# Patient Record
Sex: Female | Born: 1980 | Race: Black or African American | Hispanic: No | Marital: Single | Smoking: Never smoker
Health system: Southern US, Community
[De-identification: ages and names within clinical notes are randomized; demographics above are authoritative.]

## PROBLEM LIST (undated history)

## (undated) DIAGNOSIS — R519 Headache, unspecified: Secondary | ICD-10-CM

## (undated) DIAGNOSIS — G8929 Other chronic pain: Secondary | ICD-10-CM

## (undated) DIAGNOSIS — E785 Hyperlipidemia, unspecified: Secondary | ICD-10-CM

## (undated) DIAGNOSIS — R569 Unspecified convulsions: Secondary | ICD-10-CM

## (undated) DIAGNOSIS — J353 Hypertrophy of tonsils with hypertrophy of adenoids: Secondary | ICD-10-CM

## (undated) DIAGNOSIS — L0291 Cutaneous abscess, unspecified: Secondary | ICD-10-CM

## (undated) DIAGNOSIS — Z87898 Personal history of other specified conditions: Secondary | ICD-10-CM

## (undated) DIAGNOSIS — I10 Essential (primary) hypertension: Secondary | ICD-10-CM

## (undated) DIAGNOSIS — R51 Headache: Secondary | ICD-10-CM

## (undated) HISTORY — PX: TONSILLECTOMY: SUR1361

## (undated) HISTORY — PX: BREAST REDUCTION SURGERY: SHX8

## (undated) HISTORY — DX: Hyperlipidemia, unspecified: E78.5

---

## 2003-09-14 ENCOUNTER — Ambulatory Visit (HOSPITAL_COMMUNITY): Admission: AD | Admit: 2003-09-14 | Discharge: 2003-09-14 | Payer: Self-pay | Admitting: *Deleted

## 2003-09-30 ENCOUNTER — Ambulatory Visit (HOSPITAL_COMMUNITY): Admission: AD | Admit: 2003-09-30 | Discharge: 2003-09-30 | Payer: Self-pay | Admitting: *Deleted

## 2003-09-30 ENCOUNTER — Ambulatory Visit (HOSPITAL_COMMUNITY): Admission: RE | Admit: 2003-09-30 | Discharge: 2003-10-01 | Payer: Self-pay | Admitting: *Deleted

## 2003-10-04 ENCOUNTER — Inpatient Hospital Stay (HOSPITAL_COMMUNITY): Admission: AD | Admit: 2003-10-04 | Discharge: 2003-10-08 | Payer: Self-pay | Admitting: *Deleted

## 2003-11-29 ENCOUNTER — Inpatient Hospital Stay (HOSPITAL_COMMUNITY): Admission: EM | Admit: 2003-11-29 | Discharge: 2003-12-04 | Payer: Self-pay | Admitting: Emergency Medicine

## 2003-12-01 HISTORY — PX: CHOLECYSTECTOMY: SHX55

## 2004-01-07 ENCOUNTER — Ambulatory Visit (HOSPITAL_COMMUNITY): Admission: RE | Admit: 2004-01-07 | Discharge: 2004-01-07 | Payer: Self-pay | Admitting: *Deleted

## 2004-01-08 HISTORY — PX: TUBAL LIGATION: SHX77

## 2004-04-04 ENCOUNTER — Emergency Department (HOSPITAL_COMMUNITY): Admission: EM | Admit: 2004-04-04 | Discharge: 2004-04-04 | Payer: Self-pay | Admitting: Emergency Medicine

## 2004-07-12 ENCOUNTER — Emergency Department (HOSPITAL_COMMUNITY): Admission: EM | Admit: 2004-07-12 | Discharge: 2004-07-12 | Payer: Self-pay | Admitting: Emergency Medicine

## 2005-06-14 ENCOUNTER — Emergency Department (HOSPITAL_COMMUNITY): Admission: EM | Admit: 2005-06-14 | Discharge: 2005-06-14 | Payer: Self-pay | Admitting: Emergency Medicine

## 2006-09-17 ENCOUNTER — Emergency Department (HOSPITAL_COMMUNITY): Admission: EM | Admit: 2006-09-17 | Discharge: 2006-09-18 | Payer: Self-pay | Admitting: Emergency Medicine

## 2006-09-17 ENCOUNTER — Emergency Department (HOSPITAL_COMMUNITY): Admission: EM | Admit: 2006-09-17 | Discharge: 2006-09-17 | Payer: Self-pay | Admitting: Emergency Medicine

## 2006-09-23 ENCOUNTER — Emergency Department (HOSPITAL_COMMUNITY): Admission: EM | Admit: 2006-09-23 | Discharge: 2006-09-23 | Payer: Self-pay | Admitting: Emergency Medicine

## 2007-01-15 ENCOUNTER — Emergency Department (HOSPITAL_COMMUNITY): Admission: EM | Admit: 2007-01-15 | Discharge: 2007-01-15 | Payer: Self-pay | Admitting: Emergency Medicine

## 2007-01-23 ENCOUNTER — Emergency Department (HOSPITAL_COMMUNITY): Admission: EM | Admit: 2007-01-23 | Discharge: 2007-01-23 | Payer: Self-pay | Admitting: Emergency Medicine

## 2007-02-15 ENCOUNTER — Emergency Department (HOSPITAL_COMMUNITY): Admission: EM | Admit: 2007-02-15 | Discharge: 2007-02-15 | Payer: Self-pay | Admitting: Emergency Medicine

## 2007-04-29 ENCOUNTER — Emergency Department (HOSPITAL_COMMUNITY): Admission: EM | Admit: 2007-04-29 | Discharge: 2007-04-29 | Payer: Self-pay | Admitting: Emergency Medicine

## 2007-06-12 ENCOUNTER — Emergency Department (HOSPITAL_COMMUNITY): Admission: EM | Admit: 2007-06-12 | Discharge: 2007-06-12 | Payer: Self-pay | Admitting: Emergency Medicine

## 2007-09-30 ENCOUNTER — Emergency Department (HOSPITAL_COMMUNITY): Admission: EM | Admit: 2007-09-30 | Discharge: 2007-09-30 | Payer: Self-pay | Admitting: Emergency Medicine

## 2007-11-04 ENCOUNTER — Emergency Department (HOSPITAL_COMMUNITY): Admission: EM | Admit: 2007-11-04 | Discharge: 2007-11-04 | Payer: Self-pay | Admitting: Emergency Medicine

## 2008-04-24 DIAGNOSIS — Z87898 Personal history of other specified conditions: Secondary | ICD-10-CM

## 2008-04-24 HISTORY — DX: Personal history of other specified conditions: Z87.898

## 2008-05-13 ENCOUNTER — Emergency Department (HOSPITAL_COMMUNITY): Admission: EM | Admit: 2008-05-13 | Discharge: 2008-05-13 | Payer: Self-pay | Admitting: Emergency Medicine

## 2008-11-11 ENCOUNTER — Emergency Department (HOSPITAL_COMMUNITY): Admission: EM | Admit: 2008-11-11 | Discharge: 2008-11-11 | Payer: Self-pay | Admitting: Emergency Medicine

## 2008-12-24 ENCOUNTER — Ambulatory Visit (HOSPITAL_COMMUNITY): Admission: RE | Admit: 2008-12-24 | Discharge: 2008-12-24 | Payer: Self-pay | Admitting: Family Medicine

## 2009-04-05 ENCOUNTER — Emergency Department (HOSPITAL_COMMUNITY): Admission: EM | Admit: 2009-04-05 | Discharge: 2009-04-05 | Payer: Self-pay | Admitting: Emergency Medicine

## 2009-05-06 ENCOUNTER — Ambulatory Visit: Admission: RE | Admit: 2009-05-06 | Discharge: 2009-05-06 | Payer: Self-pay | Admitting: Neurology

## 2010-05-16 ENCOUNTER — Encounter: Payer: Self-pay | Admitting: Family Medicine

## 2010-06-20 ENCOUNTER — Emergency Department (HOSPITAL_COMMUNITY)
Admission: EM | Admit: 2010-06-20 | Discharge: 2010-06-20 | Disposition: A | Discharge status: Home / Self Care | Payer: Medicaid Other | Attending: Emergency Medicine | Admitting: Emergency Medicine

## 2010-06-20 ENCOUNTER — Emergency Department (HOSPITAL_COMMUNITY): Payer: Medicaid Other

## 2010-06-20 DIAGNOSIS — R0789 Other chest pain: Secondary | ICD-10-CM | POA: Insufficient documentation

## 2010-06-20 DIAGNOSIS — F411 Generalized anxiety disorder: Secondary | ICD-10-CM | POA: Insufficient documentation

## 2010-06-20 DIAGNOSIS — R0989 Other specified symptoms and signs involving the circulatory and respiratory systems: Secondary | ICD-10-CM | POA: Insufficient documentation

## 2010-06-20 DIAGNOSIS — I10 Essential (primary) hypertension: Secondary | ICD-10-CM | POA: Insufficient documentation

## 2010-06-20 DIAGNOSIS — R0609 Other forms of dyspnea: Secondary | ICD-10-CM | POA: Insufficient documentation

## 2010-06-20 LAB — BASIC METABOLIC PANEL
BUN: 7 mg/dL (ref 6–23)
CO2: 28 mEq/L (ref 19–32)
Calcium: 8.7 mg/dL (ref 8.4–10.5)
Chloride: 103 mEq/L (ref 96–112)
Creatinine, Ser: 0.77 mg/dL (ref 0.4–1.2)
Glucose, Bld: 87 mg/dL (ref 70–99)
Sodium: 139 mEq/L (ref 135–145)

## 2010-06-20 LAB — URINALYSIS, ROUTINE W REFLEX MICROSCOPIC
Bilirubin Urine: NEGATIVE
Ketones, ur: NEGATIVE mg/dL
Nitrite: NEGATIVE
Urine Glucose, Fasting: NEGATIVE mg/dL
Urobilinogen, UA: 1 mg/dL (ref 0.0–1.0)
pH: 6 (ref 5.0–8.0)

## 2010-06-20 LAB — DIFFERENTIAL
Eosinophils Relative: 2 % (ref 0–5)
Lymphocytes Relative: 30 % (ref 12–46)
Lymphs Abs: 2.9 10*3/uL (ref 0.7–4.0)
Monocytes Absolute: 0.6 10*3/uL (ref 0.1–1.0)
Neutrophils Relative %: 62 % (ref 43–77)

## 2010-06-20 LAB — POCT CARDIAC MARKERS: CKMB, poc: <1 ng/mL — ABNORMAL LOW (ref 1.0–8.0)

## 2010-06-20 LAB — CBC
HCT: 37 % (ref 36.0–46.0)
MCH: 29.1 pg (ref 26.0–34.0)
Platelets: 429 10*3/uL — ABNORMAL HIGH (ref 150–400)

## 2010-06-21 ENCOUNTER — Emergency Department (HOSPITAL_COMMUNITY)
Admission: EM | Admit: 2010-06-21 | Discharge: 2010-06-21 | Disposition: A | Discharge status: Home / Self Care | Payer: Medicaid Other | Attending: Emergency Medicine | Admitting: Emergency Medicine

## 2010-06-21 DIAGNOSIS — R079 Chest pain, unspecified: Secondary | ICD-10-CM | POA: Insufficient documentation

## 2010-06-21 DIAGNOSIS — R071 Chest pain on breathing: Secondary | ICD-10-CM | POA: Insufficient documentation

## 2010-06-21 DIAGNOSIS — R0602 Shortness of breath: Secondary | ICD-10-CM | POA: Insufficient documentation

## 2010-07-26 LAB — URINE MICROSCOPIC-ADD ON

## 2010-07-26 LAB — URINALYSIS, ROUTINE W REFLEX MICROSCOPIC
Bilirubin Urine: NEGATIVE
Hgb urine dipstick: NEGATIVE
Nitrite: NEGATIVE
Specific Gravity, Urine: 1.015 (ref 1.005–1.030)
pH: >9 — ABNORMAL HIGH (ref 5.0–8.0)

## 2010-09-09 NOTE — Discharge Summary (Signed)
NAME:  Denise Walters, Denise Walters                         ACCOUNT NO.:  n   MEDICAL RECORD NO.:  [id number]                   PATIENT TYPE:  INP   LOCATION:  A426                                 FACILITY:  [facility]   PHYSICIAN:  Langley Gauss, M.D.                DATE OF BIRTH:  1980/11/24   DATE OF ADMISSION:  10/04/2003  DATE OF DISCHARGE:  10/08/2003                                 DISCHARGE SUMMARY   PROCEDURES PERFORMED:  1. On October 04, 2003, placement of Foley bulb for mechanical ripening of     cervix prior to amniotomy and induction.  2. On October 05, 2003, patient experienced arrestive dilatation in the active     phase of labor at 6 cm.  She underwent primary low transverse cesarean     section utilizing spinal analgesic, delivered a viable 7 pound female     infant.  She was discharged to home on October 08, 2003, given a copy of     standard discharge instructions.   FOLLOW UP:  Will follow up in the office in 2 days time for removal of the  staples from the Pfannenstiel incision.   SPECIAL INSTRUCTIONS:  She was given a copy of standardized discharge  instructions.   DISCHARGE MEDICATIONS:  1. Tylox #30 for pain relief.  2. In addition she has a tube of Mycolog II cream for use under her     panniculus for cutaneous yeast.   LABORATORY DATA:  Serial hemoglobins and hematocrits 11.6/34.0.  On  postoperative day number one they were 10.9/31.9 with a white count of 14.6;  postoperative day number three the hemoglobin was 10.7, hematocrit was 31.6  and a white count was 16.6.  A positive blood type.   HOSPITAL COURSE:  The patient was admitted on October 04, 2003 for indicated  induction of labor.  She failed to progress in active labor, thus she  underwent a primary low transverse cesarean section under spinal analgesic  on October 05, 2003.  Postoperatively, the patient did well.  She had a Al Pimple in the subcutaneous space and a Foley catheter with adequate urine  output.  Thus,  the Foley catheter was removed on postoperative day number  one.  The Jackson-Pratt had a tendency to clot up and this was aspirated on  two occasions.  The patient was minimally ambulatory on postoperative day  number one; however, she remained afebrile.  On postoperative day number two  the patient slightly increased her ambulation.  She was up to use the  bathroom frequently and began having gas pains.  On postoperative day number  three the patient now continues to remain afebrile and has resumed all  normal bowel function. The Jackson-Pratt drain has been removed.  The  abdomen is soft, non-tender and non-distended.  The incision area is well  approximated with minimal erythema and minimal duration which is responding  very well to the Mycolog ointment for the cutaneous yeast candidiasis.  The  patient is discharged home on postoperative day number three with the  patient in the accompaniment of her mother.     ___________________________________________                                         Langley Gauss, M.D.   DC/MEDQ  D:  10/08/2003  T:  10/08/2003  Job:  [id number]

## 2010-09-09 NOTE — H&P (Signed)
NAMELANAIYA, LANTRY                         ACCOUNT NO.:  [id number]   MEDICAL RECORD NO.:  [id number]                  PATIENT TYPE:  INP   LOCATION:                                       FACILITY:  [facility]   PHYSICIAN:  Langley Gauss, M.D.                DATE OF BIRTH:  Jul 30, 1980   DATE OF ADMISSION:  10/12/2003  DATE OF DISCHARGE:                                HISTORY & PHYSICAL   This is a 30 year old gravida 1, para 1 with postoperative low transverse  cesarean section performed on October 05, 2003.  Patient was previously seen in  the office on October 09, 2003, at which time staples were removed from the  midline abdominal incision.  At that time, minimal erythema and induration  noted, and the skin edges were noted to be well approximated.  Patient comes  in today on October 12, 2003 complaining of drainage at the incision site.  She  has not been seen in the ER, as I had advised her that she may very well  have some leakage from the incision, especially if there was separation of  the skin edges.  Patient's postoperative course was without complications.   CURRENT MEDICATIONS:  She has stopped taking her pain medication on a  regular basis.  The last time took them was the day previously.   She has no known drug allergies.   PAST MEDICAL HISTORY:  Pertinent only for a prior C-section.   PHYSICAL EXAMINATION:  VITAL SIGNS:  She is noted to be afebrile.  Blood  pressure is 143/96.  ABDOMEN:  Soft.  Noted in its inferiormost portion, though, is a very foul  odor with mucopurulent, thin discharge.  Pressure applied up and down the  entire incision.  I was able to evacuate about 25 cc of this liquid until it  became serous in nature.   Patient poorly tolerated this secondary to her extreme emotional immaturity  as well as a large panniculus, which required immobilization and elevation  during the procedure.  I offered the patient hospitalization at this time  for initiation of IV  antibiotics and IV pain medications for attention for  wound cleansing; however, the patient denied hospitalization, but I felt  also that it was reasonable to continue with outpatient management.  Thus,  at this point in time, she is treated with ciprofloxacin 500 mg p.o. b.i.d.  x10 days.  In addition, she does have some pain medication which she can  take prior to re-evaluation.   Patient was seen in the a.m. of October 12, 2003 and advised to present at 1600  on October 12, 2003.  At this point in time, she has taken some Tylox.  She has  started the antibiotics and done well.  She denies any fever.   On examination, there is noted to be a minimal foul odor now identified with  manipulation of  the wound, I was able to obtain only serous drainage at the  inferior-most portion of the incision.  Additionally, the mid portion of the  incision was noted to be slightly open, but this area probed with a Q-tip  does not reveal any significantly loculations of fluid nor is any additional  purulence identified.  Currently, there is noted to be indurations  surrounding the wound, but no significant erythema and appropriate  tenderness only.  The wound is thus irrigated utilizing a Betadine solution,  which is now well tolerated by the patient.   ASSESSMENT/PLAN:  Patient with a postoperative wound infection with seroma  formation:  We will continue with q.24h. evaluations, consisting cleansing  as well as irrigation of the wound.  Patient will continue with her p.o.  antibiotics.  She is aware that should deterioration in that clinical  appearance of the wound occur, she may possibly require rehospitalization  and reexploration; however, at this point in time of the initial treatment,  she appears to have responded well and with continued drainage and  antibiotics, this very well may be possible to be managed as an outpatient.     ___________________________________________                                          Langley Gauss, M.D.   DC/MEDQ  D:  10/12/2003  T:  10/12/2003  Job:  [id number]

## 2010-09-09 NOTE — Op Note (Signed)
NAMEROSHAWN, Denise Walters                         ACCOUNT NO.:  [id number]   MEDICAL RECORD NO.:  [id number]                   PATIENT TYPE:  INP   LOCATION:  LDR1                                 FACILITY:  [facility]   PHYSICIAN:  Langley Gauss, M.D.                DATE OF BIRTH:  19-Mar-1981   DATE OF PROCEDURE:  DATE OF DISCHARGE:                                 OPERATIVE REPORT   DIAGNOSES:  1. A 38-39 week intrauterine pregnancy.  2. History of chronic benign essential hypertension preceding pregnancy     admitted for induction of labor.   PROCEDURE PERFORMED:  Placement of Foley bulb for mechanical ripening of  cervix prior to amniotomy.  Procedure performed by Dr. Roylene Reason. Lisette Grinder.   COMPLICATIONS:  None.   SPECIMENS:  None.   ADDITIONAL PROCEDURE:  Nonstress test interpretation.   The patient referred to [facility] in the late p.m. for indication  of induction of labor.  Due to the unfavorable nature of the cervix, it was  necessary to place a Foley bulb for mechanical ripening prior to the  amniotomy.  Appropriate informed consent is obtained.  The patient is placed  in the dorsal lithotomy position.  Sterile speculum examination is  performed.  The cervix is noted to be without lesions.  No vaginal bleeding  or leakage of fluid.  No abnormal discharge is noted.  Cervix appears to be  2 cm long and finger-tip dilated.  A soft 16 French Foley bulb catheter is  then passed through the endocervical os and then inflated to a volume of 60  mL of sterile normal saline.  This went without complications.  Speculum was  then removed.  Gentle traction is then applied, and the Foley bulb was taped  to the patient's left leg.  She tolerated the procedure very well.  She was  taken out of the dorsal lithotomy position.  Nonstress test is again  performed and in progress at this time to reassess patient's fetus.   Of note, the previous week when patient was seen for prenatal visit,  she had  two small 0.5 cm diameter subcutaneous abscesses in the left inner thigh  area.  These were examined at this time.  They appeared to be granulating  well.  They were previously noted to have spontaneously drained purulent  fluid.  No other lesions are identified at this time.  Specifically, no  evidence of any active herpetic lesions.      ___________________________________________                                            Langley Gauss, M.D.   DC/MEDQ  D:  10/04/2003  T:  10/04/2003  Job:  [id number]

## 2010-09-09 NOTE — Discharge Summary (Signed)
Denise Walters, Denise Walters                         ACCOUNT NO.:  [id number]   MEDICAL RECORD NO.:  [id number]                   PATIENT TYPE:  OIB   LOCATION:  A415                                 FACILITY:  [facility]   PHYSICIAN:  Langley Gauss, M.D.                DATE OF BIRTH:  1980-08-27   DATE OF ADMISSION:  09/30/2003  DATE OF DISCHARGE:  10/01/2003                                 DISCHARGE SUMMARY   FINAL DIAGNOSES:  1. 38-5/7 intrauterine pregnancy.  2. Morbid obesity.  3. History of benign essential hypertension preceding the pregnancy.  4. Abdominal pain/false labor.   HISTORY OF PRESENT ILLNESS:  This is a 30 year old gravida 1, para 0 at 14-  5/[redacted] weeks gestation who presents the p.m. of 09/30/2003 complaining of  abdominal cramping since 2200.  The patient's prenatal record is completely  reviewed as well as past medical history and past social history.  The  patient is noted to be high risk secondary to a history of benign essential  hypertension preceding the pregnancy.  She has not been on any anti-  hypertensive medication during this pregnancy.  Blood pressures have  essentially been normal at modified bedrest and low salt diet.  The patient  is a late transfer to my office from previous care provided by the [facility] residents at [facility].   PHYSICAL EXAMINATION:  GENERAL:  She is noted to be a very immature  adolescent morbidly obese female.  VITAL SIGNS:  Blood pressure 147/76, respiratory rate 22, pulse 109,  temperature 97.0.  HEENT:  No significant edema.  NECK:  Supple.  Thyroid is nonpalpable.  LUNGS:  Clear.  CARDIOVASCULAR:  Regular rate and rhythm.  ABDOMEN:  Soft and nontender.  Gravid uterus identified with a fundal height  of 42 cm.  Very large panniculus identified.  She is vertex presentation by  Leopold's maneuver.  EXTREMITIES:  Within normal limits with only trace edema.  PELVIC:  Normal external genitalia.  No lesions or ulcerations  identified.  No vaginal bleeding, no leakage of fluid.  Cervix is noted to be closed.  External fetal monitor non-stress test interpretation on 10/01/2003 reveals  fetal heart rate baseline 150 with accelerations noted.  Normal long-term  variability.  No fetal heart rate decelerations are noted.  No uterine  activity identified on external fetal monitor.  Non-stress test  interpretation on 10/01/2003 reveals reactive non-stress test.   LABORATORY DATA:  Laboratory studies subsequently reviewed reveal A positive  blood type.  Urinalysis pertinent for trace proteinuria, trace leukocyte  esterase, small bilirubin, 21-50 WBCs, many bacteria.  Hemoglobin 11.6,  hematocrit 33.6, white count 9.6.  Liver function tests within normal  limits.  Total protein 6.2.   HOSPITAL COURSE:  The patient showed no signs or symptoms of toxemia.  She  did have mildly elevated blood pressures which have been persistent  throughout the pregnancy.  She  will be treated for a urinary tract infection  with Macrodantin 100 mg at the hospital followed by 100 mg p.o. b.i.d. x7  days as an outpatient.  I discussed at length  with the patient and her mother her current medical status.  Preparations  are made for medically indicated induction of labor due to the unfavorable  nature of the cervix.  Will probably be forced to proceed initially with  Foley bulb ripening.  Services of OB observation extending from 09/30/2003  through 10/01/2003.     ___________________________________________                                         Langley Gauss, M.D.   DC/MEDQ  D:  10/19/2003  T:  10/19/2003  Job:  [id number]

## 2010-09-09 NOTE — Op Note (Signed)
Denise Walters, Denise Walters                         ACCOUNT NO.:  [id number]   MEDICAL RECORD NO.:  [id number]                   PATIENT TYPE:  INP   LOCATION:  A323                                 FACILITY:  [facility]   PHYSICIAN:  Jerolyn Shin C. Katrinka Blazing, M.D.                DATE OF BIRTH:  1980/05/14   DATE OF PROCEDURE:  12/01/2003  DATE OF DISCHARGE:                                 OPERATIVE REPORT   PREOPERATIVE DIAGNOSIS:  Acute cholecystitis, cholelithiasis.   POSTOPERATIVE DIAGNOSIS:  Acute cholecystitis, cholelithiasis.   PROCEDURE:  Laparoscopic cholecystectomy.   SURGEON:  Dr. Katrinka Blazing.   DESCRIPTION:  Under general anesthesia, the patient's abdomen was prepped  and draped in a sterile field.  A supraumbilical incision was made and  Veress needle was inserted into the peritoneal cavity with some difficulty.  After being assured that the needle was in the peritoneal cavity by very low  intra-abdominal pressure and free flow of saline, the abdomen was  insufflated with 3.5 L of CO2.  Using a Visiport guide, a 10 mm port was  placed uneventfully.  The laparoscope was placed.  The patient was placed in  reversed Trendelenburg position.  There was a lot of bile-tinged ascetic  fluid surrounding the liver and the area of the gallbladder.  There were  acute and chronic adhesions to the inferior surface of the liver surrounding  the gallbladder.  Under video scopic guidance, a 10 mm port and two 5 mm  ports were placed without difficulty.  The adhesions could not be taken down  bluntly so they were taken down using electrocautery with a hook spatula.  Once the adhesions were taken down, the gallbladder was visualized.  There  were extensive adhesions to the gallbladder.  There was also edema in the  gallbladder wall as well as in the adhesions.  Using the same hook spatula,  these adhesions were taken down until the gallbladder was fully visualized.  The gallbladder was then positioned by the  assistant.  The cystic duct was  dissected for its entire length.  It was clipped close to the gallbladder,  infundibulum and four distal clips were placed.  The cystic duct was  transected.  There were three small cystic artery branches.  These were  clipped with two clips and divided.  Using electrocautery, the gallbladder  was then separated from the intrahepatic space without much difficulty.  The  gallbladder was long and thickened.  It was placed in the endo-catch device  and retrieved.  Irrigation was carried out.  There was no bleeding from the  bed.  There was no evidence of bile leak otherwise.  A JP drain was placed  and brought out through the most lateral incision in the right upper  quadrant.  Re-inspection of the bed was carried out.  CO2 was allowed to  escape from the abdomen and the ports were removed.  The  incisions were  closed using 0 Dexon on the fascia of the incision at the umbilicus and  staples on the skin of the other incisions.  The patient tolerated the  procedure well.  The JP drain was secured with 3-0 nylon.  Dressings were  placed.  She was awakened from anesthesia uneventfully, transferred to a bed  and taken to a post anesthetic care unit for further monitoring.      ___________________________________________                                            Dirk Dress. Katrinka Blazing, M.D.   LCS/MEDQ  D:  12/01/2003  T:  12/01/2003  Job:  [id number]

## 2010-09-09 NOTE — Op Note (Signed)
Denise Walters, Denise Walters                           ACCOUNT NO.:  [id number]   MEDICAL RECORD NO.:  [id number]                   PATIENT TYPE:   LOCATION:                                       FACILITY:   PHYSICIAN:  Langley Gauss, M.D.                DATE OF BIRTH:   DATE OF PROCEDURE:  01/08/2004  DATE OF DISCHARGE:                                 OPERATIVE REPORT   PREOPERATIVE DIAGNOSIS:  Desires permanent sterilization.   POSTOPERATIVE DIAGNOSIS:  Desires permanent sterilization.   OPERATION/PROCEDURE:  Laparoscopic tubal ligation utilizing bipolar cautery.   SURGEON:  Langley Gauss, M.D.   ANESTHESIA:  General endotracheal.   SPECIMENS:  None.   COMPLICATIONS:  None.   FINDINGS:  Some adhesive disease, very fine and filmy in nature between  ovaries and posterior aspect of the body of the uterus.  In addition, morbid  obesity with very thick abdominal wall with large amount of subcutaneous  fat, making the procedure technically difficult.   DESCRIPTION OF PROCEDURE:  The patient is taken to the operating room.  Vital signs are stable.  She underwent uncomplicated induction of general  anesthesia, placed in the dorsal lithotomy position, prepped and draped in  the usual sterile manner.  Red rubber catheter used to drain 50 mL of clear  yellow urine from the bladder.  Speculum was placed in the vagina.  Anterior  lip of the cervix was grasped with a single-tooth tenaculum.  Hulka uterine  manipulator was then placed into the lower uterine segment and used to clamp  the cervix at 12 o'clock.  Speculum is removed.  Changed to sterile gloves.  Standing at the patient's left side.   A 1 cm vertical incision is made just inferior to the umbilicus.  A 1 cm  transverse incision is made suprapubically.  The abdominal wall is then  elevated.  The Veress needle was then passed through the subumbilical  incision to atraumatically enter the peritoneal cavity, drop test confirmed  the  proper intraperitoneal placement.  Pneumoperitoneum is then created by  insufflating 3 L of CO2 gas at a filling pressure of less than 15 mmHg.  Veress needle was then removed.  The abdominal walls were then again  elevated.  The Visiport trocar and sleeve were inserted through the  subumbilical incision __________  towards the hollow sacrum.  This is done  atraumatically under direct visualization which confirms a proper  intraperitoneal placement.  Under direct visualization a 5 mm trocar and  sleeve were then inserted suprapubically in the midline under direct  visualization.  This was done atraumatically. The patient is now placed on  the Trendelenburg.  However, her procedure is still technically difficult  due to high intra-abdominal pressures due to the patient's morbid obesity.  Thus attempt was made to place the 5 mm trocar and sleeve in the midline  about midway between the umbilicus and suprapubic  site.  This is done  atraumatically.  However, again attempts to identify and visualize the  fallopian tubes are unsuccessful.  Thus the initial suprapubic site is  utilized.  With the uterus now elevated, I was able to identify each of the  fallopian tubes.  Confirmation of  fallopian tubes is made by tracing each  out to their fimbriated end.  The triple cauterization technique is then  utilized on each fallopian tube continuing the cauterization until extensive  tubular destruction was noted bilaterally.  Appropriate photographs are  taken for permanent records.  The remainder of the pelvis as described  previously.  The instruments are removed and  gas is allowed to escape.  The  incision sites are then closed utilizing skin glue.  Tenaculum is removed.  The patient is then reversed of anesthesia, taken to the recovery room in  stable condition.   Operative findings discussed with the patient's awaiting mother.      ___________________________________________                                             Langley Gauss, M.D.   DC/MEDQ  D:  01/08/2004  T:  01/08/2004  Job:  [id number]

## 2010-09-09 NOTE — H&P (Signed)
NAMESOLEI, WUBBEN                         ACCOUNT NO.:  [id number]   MEDICAL RECORD NO.:  [id number]                   PATIENT TYPE:  INP   LOCATION:  A323                                 FACILITY:  [facility]   PHYSICIAN:  Jerolyn Shin C. Katrinka Blazing, M.D.                DATE OF BIRTH:  Sep 27, 1980   DATE OF ADMISSION:  11/29/2003  DATE OF DISCHARGE:                                HISTORY & PHYSICAL   HISTORY:  A 29 year old female who gives a history of acute onset of severe  abdominal pain about three days ago which was about November 26, 2003.  The  pain was extremely severe.  It was in the epigastrium and radiated into her  back.  She had emesis which started two days prior to admission and  continued until she was seen in the emergency room on November 29, 2003.  In  the emergency room, she was in moderately severe distress.  She had  recurrent episodes of retching with nausea and vomiting.  She also  complained of chest pain.  Abdominal examination was compatible with an  acute abdomen.  White count was 14,300, potassium 2.8.  There was mild  elevation of SGOT at 44, and SGPT at 45, with normal alkaline phosphatase  and bilirubin of 2.2.  Lipase was 3060, and amylase was over 2000.  The  patient was felt to have biliary pancreatitis.  She is admitted to have  treatment of her symptoms and she will have urgent ultrasound in the  morning.   PAST MEDICAL HISTORY:  1. She had a normal delivery in June 2005.  2. Hypertension.   MEDICATIONS:  Hydrochlorothiazide 25 mg daily.   PAST SURGICAL HISTORY:  1. Breast reduction at age 25.  2. Cesarean section for failure to progress in June 2005.   FAMILY HISTORY:  Negative.   ALLERGIES:  No known drug allergies, though she does state that she has an  allergy to CHOCOLATE.   SOCIAL HISTORY:  She is single, she has one child.  She is unemployed.  She  does not drink, smoke, or use drugs.   PHYSICAL EXAMINATION:  VITAL SIGNS:  Blood pressure 126/74,  pulse 100,  respirations 20, O2 saturation 97%, temperature 98 degrees.  HEENT:  Unremarkable.  There is no jaundice.  NECK:  Supple, no JVD or bruit.  CHEST:  Clear to auscultation, no rales, rubs, rhonchi, or wheezes.  HEART:  Regular rate and rhythm without murmurs, rubs, or gallops.  ABDOMEN:  Obese, moderately tender with diffuse tenderness in both upper  quadrants and epigastrium.  There is no lower abdominal tenderness.  There  is no mass.  She has good active bowel sounds.  EXTREMITIES:  No cyanosis, clubbing, or edema.  NEUROLOGIC:  No focal motor, sensory, or cerebellar deficits.  Cranial  nerves are intact.   IMPRESSION:  1. Suspected biliary pancreatitis.  2. Probable cholelithiasis with cholecystitis.  3. History of hypertension.   PLAN:  The patient will receive Dilaudid for pain.  She will be continued on  Phenergan and Zofran p.r.n. for nausea.  She is started on Ancef because of  her leukocytosis.  I will get an urgent ultrasound in the morning, and we  will proceed with operative therapy and/or endoscopic retrograde  cholangiopancreatography based on the results of her liver function tests  and her amylase and lipase.     ___________________________________________                                         Dirk Dress. Katrinka Blazing, M.D.   LCS/MEDQ  D:  11/30/2003  T:  11/30/2003  Job:  [id number]

## 2010-09-09 NOTE — H&P (Signed)
Denise, Walters                         ACCOUNT NO.:  [id number]   MEDICAL RECORD NO.:  [id number]                   PATIENT TYPE:  OBV   LOCATION:  A415                                 FACILITY:  [facility]   PHYSICIAN:  Langley Gauss, M.D.                DATE OF BIRTH:  1980-06-20   DATE OF ADMISSION:  09/14/2003  DATE OF DISCHARGE:  09/14/2003                                HISTORY & PHYSICAL   This is a 30 year old gravida 1, para 0 seen in an initial OB visit in our  office on Sep 16, 2003.  Patient is guesstimated to be at 36-37 weeks  estimated gestational age.  Patient had previously been seen at [facility], at which time she was referred to the residency program  at [facility], where she has had prenatal care throughout the pregnancy.  Patient states that she has been told that she is a high-risk pregnancy, as  she is high risk for hypertension.  Patient had previously taken  hydrochlorothiazide, HCTZ, 25 mg p.o. q.d. prior to the pregnancy, with  about one year's duration of total use.  This was discontinued during the  first trimester, and subsequently she has been followed closely with careful  monitoring of blood pressures to see whether hypertension does develop.   Patient has had two ultrasounds done during the pregnancy, most recently one  week ago, which by her report at [facility] revealed adequate fetal growth.   Patient has had no hypertensive problems during the pregnancy.  She has not  had any non-stress testing performed.   Patient was having some abdominal and pelvic cramping on Sep 14, 2003.  She  presented to [facility] as an OB unassigned.  At that time,  arrangements were made such that the patient would transfer her prenatal  care here to my office, as she did plan on delivering at [facility]  throughout the whole pregnancy.   PAST MEDICAL HISTORY:  1. She has a history of breast reduction surgery on September 24, 2002.   She is     noted to be morbidly obese.  2. Hypertensive history, as described previously.  3. She denies a history of diabetes.  She states that a glucose tolerance     test during this pregnancy was noted to be normal.   She has no known drug allergies.   SOCIAL HISTORY:  Patient is a nonsmoker.  She is not employed.  The father  of the baby is not involved.  She does have some transportation  difficulties.  Her mother, Denise Walters, was unable to provide transportation  today.   She denies any history of STDs.  Pertinently denies any history of genital  or oral herpes.   She had originally used Depo-Provera for birth control purposes.  Her last  menstrual period is unreliable.   PHYSICAL EXAMINATION:  VITAL SIGNS:  Height 5 feet 2.  Pre-pregnancy weighed  250.  Today's weight is 253.  Blood pressure 136/82, pulse rate 80,  respiratory rate 20.  GENERAL:  A morbidly obese black female.  HEENT:  Negative.  No adenopathy.  NECK:  Supple.  Thyroid is nonpalpable.  LUNGS:  Clear.  HEART:  Regular rate and rhythm.  ABDOMEN:  Soft and nontender.  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  No  surgical scars are identified.  Fundal height due to the panniculus.  She is  vertex presentation by Leopold's maneuver.  EXTREMITIES:  Noted to be normal with only trace edema.  PELVIC:  Deferred.  Patient had an examination performed one week previously  at [facility], at which time she was notified that she had what sounds like  a narrow pelvis.  Fetal heart tones are auscultated in the 150s.   ASSESSMENT:  A 36-37 week intrauterine pregnancy.  Patient has been given an  [facility] of October 10, 2003 at [facility] for dating purposes.   PLAN:  We will obtain the records from [facility] to have on file her  prenatal labs.  It sounds as though she has had GBS testing done previously  with results unknown.  Patient to be followed at this point in time on a  weekly basis to look for development of any  hypertensive changes.  In  addition, she is noted to be at increased risk for preeclampsia.     ___________________________________________                                         Langley Gauss, M.D.   DC/MEDQ  D:  09/17/2003  T:  09/17/2003  Job:  [id number]

## 2010-09-09 NOTE — Discharge Summary (Signed)
NAMEKERISSA, Denise Walters                         ACCOUNT NO.:  [id number]   MEDICAL RECORD NO.:  [id number]                   PATIENT TYPE:  INP   LOCATION:  A217                                 FACILITY:  [facility]   PHYSICIAN:  Jerolyn Shin C. Katrinka Blazing, M.D.                DATE OF BIRTH:  Jul 18, 1980   DATE OF ADMISSION:  11/29/2003  DATE OF DISCHARGE:  12/04/2003                                 DISCHARGE SUMMARY   DISCHARGE DIAGNOSES:  1.  Acute cholecystitis and cholelithiasis.  2.  Postoperative ileus.  3.  Mild anemia.  4.  Hypokalemia.  5.  Biliary pancreatitis.   SPECIAL PROCEDURE:  Laparoscopic cholecystectomy, August 9.   DISPOSITION:  The patient was discharged home in stable, satisfactory  condition.   DISCHARGE MEDICATIONS:  1.  Reglan 10 mg, one tablet a.c. and h.s.  2.  Ferrous sulfate 325 mg daily.  3.  Lexapro 10 mg daily.  4.  Tylox 5/500 q.4-6h. p.r.n. pain.   FOLLOWUP:  The patient is advised to have followup in the office in one to  two weeks.   SUMMARY:  A 30 year old female who gives a history of acute onset of severe  abdominal pain three days prior to admission.  The pain was severe, located  in the epigastrium and radiated into her back.  She had emesis which started  two days prior to admission.  She was seen in the emergency room in  moderately severe distress, with recurrent pain, nausea and vomiting.  White  count was 14,300.  Potassium was 2.8.  SGOT was 44, SGPT 45, bilirubin 2.2,  lipase 3060, amylase over 2000.  The patient was felt to have biliary  pancreatitis.  She was afebrile on exam.  Abdomen showed moderate tenderness  in both upper quadrants and epigastrium without masses.  She was admitted,  treated with IV medications including Dilaudid, Phenergan and Zofran.  She  was also started on Ancef.  Ultrasound was done.  This confirmed gallstones  with normal-size common bile duct.   By August 8, her white count was down to 8.9, bilirubin was down to  1.3.  Amylase was down to 398, and lipase was down to 291.  She underwent  laparoscopic cholecystectomy on August 9.  Post-procedure, her amylase and  lipase returned to normal, but her bilirubin increased slightly.  By the  second postoperative day. Bilirubin and liver function panels were normal.  She was switched to oral medications.   DISCHARGE CONDITION:  She was discharged home on the afternoon of August 12,  in satisfactory condition.      LCS/MEDQ  D:  01/10/2004  T:  01/10/2004  Job:  [id number]

## 2010-09-09 NOTE — H&P (Signed)
Denise Walters, ASBILL                         ACCOUNT NO.:  [id number]   MEDICAL RECORD NO.:  [id number]                   PATIENT TYPE:  OBV   LOCATION:  A415                                 FACILITY:  [facility]   PHYSICIAN:  Langley Gauss, M.D.                DATE OF BIRTH:  1981-04-19   DATE OF ADMISSION:  10/04/2003  DATE OF DISCHARGE:                                HISTORY & PHYSICAL   HISTORY OF PRESENT ILLNESS:  This is a 30 year old gravida 1, para 0 at [redacted]  weeks gestation who is admitted for induction of labor for indication of  chronic hypertension.  The patient pertinently is a late transfer to my  office with the first visit being seen Sep 16, 2003, at [redacted] weeks gestation.  As a transfer from _________[facility], or more specifically,  [facility], where she is being seen by the [facility]  residents.  The patient had received good prenatal care via the resident  staff but desired to transfer here for delivery to be closer to her home.  The patient is noted to have a history of chronic hypertension.  Records are  reviewed, and the patient previously was on Monopril/HCTZ from July 27, 2002, to February 25, 2003.  She was discontinued at the onset of this  pregnancy.  The patient states that she has been monitored and followed very  carefully over a period of time with persistently elevated blood pressures  prior to being diagnosed with chronic hypertension and starting on that  regimen.  The patient's best [facility] is that of October 10, 2002.  Thus, she would have stopped taking the antihypertensive medications at  about [redacted] weeks gestation.  The patient subsequently has been followed very  carefully there for any evidence of continuing hypertension, development of  gestational hypertension or preeclampsia.  She has remained essentially on  Normotensive during the subsequent pregnancy with no initiation of any  antihypertensive medications.   Additionally, resident staff did not feel it  was necessary to perform any serial nonstress testing.  She did, however,  have an ultrasound done early in the pregnancy, and again, this was repeated  at [facility] on Sep 20, 2003, to assess the fetal growth.  The fetal  growth at that point in time was noted to be adequate for gestational age  with normal amniotic fluid volume.   PAST MEDICAL HISTORY:  1. No known drug allergies.  2. Breast reduction surgery June 2004.   CURRENT MEDICATIONS:  Prenatal vitamins.   SOCIAL HISTORY:  She is not employed.  Father of the baby is not involved.  Patient's mother, Tazia Illescas, will be her significant other during the  course of labor.  She does plan on bottle feeding.  She is undecided  regarding birth control following delivery, but [facility] has  apparently discussed IUD with her.  PHYSICAL EXAMINATION:  VITAL SIGNS:  Height 5 feet 2 inches, prepregnancy  weight 260, most recent weight 255.  Blood pressure 129/87.  HEENT:  Negative.  No adenopathy.  NECK:  Supple.  THYROID:  Nonpalpable.  CARDIOVASCULAR:  Regular rhythm.  ABDOMEN:  Soft and nontender.  No surgical scars are identified.  She is  vertex presentation by Leopold's maneuvers.  EXTREMITIES:  Normal with only trace edema.  PELVIC:  Normal external genitalia.  No lesions or ulcerations.  No leakage  of fluid.  No vaginal bleeding.  Cervix is noted to be posterior, fingertip  dilated, but the vertex is distended with engagement and at a 0 station.  Fetal heart tones are auscultated in 150's.   ASSESSMENT:  A 39-week intrauterine pregnancy with documented history of  chronic hypertension preceding the pregnancy.  However, she subsequently has  been followed very closely during this pregnancy.  She has not required any  initiation of any antihypertensive medications, but she is noted to be at  increased risk for development of preeclampsia.  Thus, at this point in   time, at [redacted] weeks gestations, she is admitted for indicated induction of  labor due to the somewhat unfavorable nature of the cervix.  Will proceed  initially with placement of Foley bulb for mechanical ripening of cervix  prior to proceeding with amniotomy.  The patient and her mother are in  agreement with this and prefer this due to her high risk pregnancy.  Additionally, a nonstress test has been scheduled to be performed as an  outpatient at [facility] on September 30, 2003.     ___________________________________________                                         Langley Gauss, M.D.   DC/MEDQ  D:  09/29/2003  T:  09/29/2003  Job:  [id number]

## 2010-09-09 NOTE — Group Therapy Note (Signed)
[name], [name]                           ACCOUNT NO.:  [id number]   MEDICAL RECORD NO.:  [id number]                   PATIENT TYPE:   LOCATION:                                       FACILITY:   PHYSICIAN:  Langley Gauss, M.D.                DATE OF BIRTH:   DATE OF PROCEDURE:  10/05/2003  DATE OF DISCHARGE:                                   PROGRESS NOTE   The patient had Foley bulb fall out spontaneously.  At the time of amniotomy  she was noted to be 4 cm dilated.  At this point in time she continues to  have reassuring fetal heart rate, draining clear amniotic fluid; however, I  was unable to evaluate the adequacy of the uterine contractions secondary to  obesity.   Thus, the patient is reexamined.  The cervix is noted to be 6 cm dilated,  70% effaced, vertex to the minus 1 station.  An intrauterine pressure  catheter is placed to evaluate contraction adequacy and the frequency.   ASSESSMENT AND PLAN:  The patient now in active phase of labor with  expectation that she should begin to make cervical change.      ___________________________________________                                            Langley Gauss, M.D.   DC/MEDQ  D:  10/05/2003  T:  10/05/2003  Job:  [id number]

## 2010-09-09 NOTE — H&P (Signed)
Denise Walters, Denise Walters                           ACCOUNT NO.:  [id number]   MEDICAL RECORD NO.:  [id number]                   PATIENT TYPE:   LOCATION:                                       FACILITY:   PHYSICIAN:  Langley Gauss, M.D.                DATE OF BIRTH:   DATE OF ADMISSION:  01/07/2004  DATE OF DISCHARGE:                                HISTORY & PHYSICAL   A 30 year old gravida 1, para 1 status post primary low transverse cesarean  section performed October 05, 2003.  Postoperative course complicated by  postoperative wound seroma and infection requiring treatment with oral  antibiotics as well as opening of the wound, debridement, followed by  irrigation utilizing a Betadine solution.  Subsequently, this has healed  very well.  She did also have some postpartum fluid retention requiring  treatment with hydrochlorothiazide.  Patient is also noted to be morbidly  obese.  She did have a history of benign essential hypertension preceding  pregnancy.  However, during the pregnancy she did not require treatment with  any antihypertensive medications.  Patient desires permanent irreversible  sterilization at this time.  She accepts the 2% failure rate associated with  the procedure.  She also understands that this is to be considered a  permanent and irreversible procedure.   ALLERGIES:  No known drug allergies.   MEDICATIONS:  HCTZ 25 mg p.o. daily p.r.n. fluid retention.   PHYSICAL EXAMINATION:  GENERAL:  Morbidly obese female.  VITAL SIGNS:  Blood pressure 137/84, pulse 87, weight 223 pounds.  HEENT:  Negative.  NECK:  No adenopathy.  Neck is supple.  Thyroid is nonpalpable.  LUNGS:  Clear.  CARDIOVASCULAR:  Regular rate and rhythm.  ABDOMEN:  Soft and nontender.  Midline abdominal incision is now well  healed.  EXTREMITIES:  Normal.  PELVIC:  Normal external genitalia.  Cervix without lesions.  No significant  discharge identified.  Uterus normal size, nontender.  No  adnexal masses.   ASSESSMENT:  Gravida 1, para 1.  She is adamant regarding desire for  permanent sterilization.  Discussed this at length with her but she is  absolutely certain that she would like one child only.     ___________________________________________                                         Langley Gauss, M.D.   DC/MEDQ  D:  01/07/2004  T:  01/07/2004  Job:  [id number]   cc:   [facility]  Fax: [phone]

## 2010-09-09 NOTE — Op Note (Signed)
Denise Walters, Denise Walters                         ACCOUNT NO.:  [id number]   MEDICAL RECORD NO.:  [id number]                   PATIENT TYPE:  INP   LOCATION:  A426                                 FACILITY:  [facility]   PHYSICIAN:  Langley Gauss, M.D.                DATE OF BIRTH:  Sep 05, 1980   DATE OF PROCEDURE:  10/05/2003  DATE OF DISCHARGE:                                 OPERATIVE REPORT   DIAGNOSES:  1. A 39 week intrauterine pregnancy presenting in labor.  2. History of chronic benign and essential hypertension preceding the     pregnancy.  3. Failure to progress with the rest of dilatation at 6 cm secondary to     cephalopelvic disproportion.  4. Failure to progress in the active phase of labor.   SURGERY:  Primary low transverse cesarean section.   DELIVERY:  A 7 pound, 0 ounce female infant.   SURGEON:  Dr. Roylene Reason. Denise Walters BLOOD LOSS:  800 mL   ANESTHESIA:  Spinal.   COMPLICATIONS:  None.   DRAINS:  1. J-P in subcutaneous space.  2. Foley drained the bladder.   SPECIMENS:  Arterial cord gas and cord blood to laboratory.  Placenta is  examined and noted to be apparently intact with a three-vessel umbilical  cord.   PEDIATRICIAN:  Dr. Vivia Ewing   SUMMARY:  A diagnosis of arrested dilatation having been made, the decision  was made to proceed with C-section.  Pediatrician was notified.  The patient  was taken to the operating room.  A spinal analgesic was administered by the  anesthesia staff without difficulty.  The patient was then placed on the OR  table, a slight left lateral tilt, prepped and draped in the usual sterile  manner.  Foley catheter had been placed on the fourth floor.  After  assurance of adequate surgical analgesia, a midline incision is performed,  dissected down to the fascial plane utilizing the knife, cauterizing  bleeders along the way.  Fascia is likewise incised in a vertical manner  utilizing a knife and then sharply  dissecting off the underlying rectus  muscle in the avascular plane.  The rectus muscle was then bluntly  separated.  The peritoneal cavity is atraumatically bluntly entered at the  superior-most portion of the incision, perineal incision extended superiorly  and inferiorly, and inferiorly we directly visualized the bladder to avoid  its accidental injury.  Bladder blade is placed.  Lower uterine segment is  identified.  A bladder flap is created from the vesicouterine fold in the  avascular plane.  Knife is then used to score a low transverse uterine  incision.  Intact amniotic sac is encountered in the midline.  My index  finger is then used to extend the low transverse uterine incision  bilaterally.  Kelly clamp is used to artificially rupture the membranes.  Clear amniotic fluid is noted.  My right hand reached into the uterine  cavity; head of the infant is flexed and elevated, noted to be in an LOP  position.  It is delivered to the edge of the wound.  The disposable suction  connected to wall suction is then placed on the infant's vertex.  Gentle  suction with wall suction combined with gentle traction resulted in a very  easy delivery of the vertex as well as the remainder of the infant through  the uterine incision without difficulty.  Mouth and nares bulb suctioned of  clear amniotic fluid.  Spontaneous cry is noted.  The remainder of the  infant likewise delivered without difficulty.  Umbilical cord is milked  towards the infant.  Cord is doubly clamped and cut and infant is handed to  the waiting pediatrician, Dr. Vivia Ewing.  Gentle traction on the umbilical  cord results in separation which upon examination appears to be an intact  three-vessel placenta with no associated abnormalities.  The uterus is then  exteriorized.  Intrauterine exploration reveals no retained placenta  fragments.  The uterine incision is noted to not have extended.  This  uterine incision is then  easily closed utilizing 9 chromic in a running lock  fashion on the first layer followed by second layer of 0 chromic, also in a  running lock fashion, functioning as an imbricating-type layer.  This  results in excellent hemostasis.  No additional suturing is required.  The  entirety of the uterine incision is closed.  Sponge, needle, and instrument  counts are correct at this point.  The uterus is then returned to the pelvic  cavity; peritoneal edges are grasped using Kelly clamps, and the peritoneum  is closed with a continuous running 0 chromic suture.  Gutters had likewise  been irrigated free of all clots.  The fascia is then identified, and the  fascial edges are brought together utilizing a continuous looped #1 PDS  suture.  This resulted in an excellent and secure closure.  The J-P drain is  placed in the subcutaneous space with a separate exit wound to the apex of  the incision.  This is sutured in place.  Three interrupted #1 PDS sutures  are placed as horizontal mattress-type sutures to function as retention-type  sutures.  The skin is then completely closed utilizing skin staples.  The  patient tolerated the procedure very well.  Subsequently she was taken to  the recovery room in stable condition.  The report from the nursery is that  the infant likewise is doing well.      ___________________________________________                                            Langley Gauss, M.D.   DC/MEDQ  D:  10/05/2003  T:  10/06/2003  Job:  [id number]

## 2010-09-09 NOTE — Discharge Summary (Signed)
[name], Denise Walters                           ACCOUNT NO.:  [id number]   MEDICAL RECORD NO.:  [id number]                   PATIENT TYPE:   LOCATION:                                       FACILITY:   PHYSICIAN:  Langley Gauss, M.D.                DATE OF BIRTH:   DATE OF ADMISSION:  01/07/2004  DATE OF DISCHARGE:                                 DISCHARGE SUMMARY   DISCHARGE SUMMARY:  The patient satisfied all criteria.  Is thus discharged  to home on the same date as service, January 07, 2004.  She was given a  copy of the standardized discharge instructions and she is to follow up in  the office within the next week.   PERTINENT LABORATORY DATA:  hCG is negative.   DISCHARGE MEDICATIONS:  Tylox.   HOSPITAL COURSE:  See previous dictation.  The patient was admitted through  ambulatory surgical unit on January 07, 2004.  She desired permanent  sterilization.  This had been discussed with the patient on multiple  occasions prior to today's date.  She was absolutely adamant regarding her  desire for sterilization.  The operative procedure itself was performed  without complications.  Postoperatively the patient did well.  She had no  postoperative complications.  Thus, discharged her home on the same date of  service, January 07, 2004.     ___________________________________________                                         Langley Gauss, M.D.   DC/MEDQ  D:  01/08/2004  T:  01/08/2004  Job:  [id number]

## 2011-01-13 LAB — STREP A DNA PROBE: Group A Strep Probe: NEGATIVE

## 2011-02-09 LAB — CBC
Hemoglobin: 12
MCV: 89.2
Platelets: 416 — ABNORMAL HIGH
RBC: 3.97

## 2011-02-09 LAB — URINE MICROSCOPIC-ADD ON

## 2011-02-09 LAB — URINALYSIS, ROUTINE W REFLEX MICROSCOPIC
Glucose, UA: NEGATIVE
Ketones, ur: NEGATIVE
Leukocytes, UA: NEGATIVE

## 2011-02-09 LAB — BASIC METABOLIC PANEL
BUN: 10
CO2: 28
Calcium: 8.9
Chloride: 108
Glucose, Bld: 96
Potassium: 3.5

## 2011-02-09 LAB — RAPID URINE DRUG SCREEN, HOSP PERFORMED
Amphetamines: NOT DETECTED
Barbiturates: NOT DETECTED
Benzodiazepines: NOT DETECTED
Cocaine: NOT DETECTED

## 2011-02-09 LAB — PREGNANCY, URINE: Preg Test, Ur: NEGATIVE

## 2011-02-09 LAB — DIFFERENTIAL
Basophils Relative: 0
Eosinophils Absolute: 0.2
Eosinophils Relative: 2
Lymphocytes Relative: 31
Monocytes Relative: 6
Neutro Abs: 3.8
Neutrophils Relative %: 60

## 2011-02-09 LAB — PHENYTOIN LEVEL, TOTAL: Phenytoin Lvl: <2.5 — ABNORMAL LOW

## 2011-02-09 LAB — URINE CULTURE

## 2011-04-24 ENCOUNTER — Emergency Department (HOSPITAL_COMMUNITY)
Admission: EM | Admit: 2011-04-24 | Discharge: 2011-04-24 | Disposition: A | Discharge status: Home / Self Care | Payer: Medicaid Other | Attending: Emergency Medicine | Admitting: Emergency Medicine

## 2011-04-24 DIAGNOSIS — R000 Tachycardia, unspecified: Secondary | ICD-10-CM | POA: Insufficient documentation

## 2011-04-24 DIAGNOSIS — R509 Fever, unspecified: Secondary | ICD-10-CM | POA: Insufficient documentation

## 2011-04-24 DIAGNOSIS — R1110 Vomiting, unspecified: Secondary | ICD-10-CM | POA: Insufficient documentation

## 2011-04-24 DIAGNOSIS — R51 Headache: Secondary | ICD-10-CM

## 2011-04-24 DIAGNOSIS — I10 Essential (primary) hypertension: Secondary | ICD-10-CM | POA: Insufficient documentation

## 2011-04-24 DIAGNOSIS — R109 Unspecified abdominal pain: Secondary | ICD-10-CM | POA: Insufficient documentation

## 2011-04-24 HISTORY — DX: Essential (primary) hypertension: I10

## 2011-04-24 HISTORY — DX: Unspecified convulsions: R56.9

## 2011-04-24 MED ORDER — PROMETHAZINE HCL 25 MG PO TABS: 12.5000 mg | ORAL_TABLET | Freq: Four times a day (QID) | ORAL | Status: DC | PRN

## 2011-04-24 MED ORDER — ONDANSETRON HCL 4 MG/2ML IJ SOLN
4.0000 mg | Freq: Once | INTRAMUSCULAR | Status: AC
  Administered 2011-04-24: 4 mg via INTRAVENOUS
  Filled 2011-04-24: qty 2

## 2011-04-24 MED ORDER — MORPHINE SULFATE 2 MG/ML IJ SOLN
2.0000 mg | Freq: Once | INTRAMUSCULAR | Status: AC
  Administered 2011-04-24: 2 mg via INTRAVENOUS
  Filled 2011-04-24: qty 1

## 2011-04-24 MED ORDER — SODIUM CHLORIDE 0.9 % IV BOLUS (SEPSIS)
1000.0000 mL | Freq: Once | INTRAVENOUS | Status: AC
  Administered 2011-04-24: 1000 mL via INTRAVENOUS

## 2011-04-24 MED ORDER — KETOROLAC TROMETHAMINE 30 MG/ML IJ SOLN
30.0000 mg | Freq: Once | INTRAMUSCULAR | Status: AC
  Administered 2011-04-24: 30 mg via INTRAVENOUS
  Filled 2011-04-24: qty 1

## 2011-04-24 NOTE — ED Notes (Signed)
Pt presents with fever, vomiting, headache, chills x 3 days. NAD at this time.

## 2011-04-24 NOTE — ED Provider Notes (Signed)
History   This chart was scribed for [employer]. Colon Branch, MD by Melba Coon. The patient was seen in room APA05/APA05 and the patient's care was started at 10:00PM.    CSN: [id number]  Arrival date & time 04/24/11  2120   First MD Initiated Contact with Patient 04/24/11 2153      Chief Complaint  Patient presents with  . Fever  . Headache  . Emesis  . Chills    (Consider location/radiation/quality/duration/timing/severity/associated sxs/prior treatment) HPI Denise Walters is a 30 y.o. female who presents to the Emergency Department complaining of a constant, moderate to severe fever with an onset 3 days with symptoms at worst today (2:00PM). HA, chills, and vomit present with abd pain present while vomiting. Decreased appetite present due to lack of ability to contain food in stomach. Last nml bowel movement was this morning. No sore throat, body aches, diarrhea. No medications have been taken at home. No chance of pregnancy due to past tubal ligation.  [facility] [place]  Past Medical History  Diagnosis Date  . Hypertension   . Seizures     Past Surgical History  Procedure Date  . Cholecystectomy   . Breast surgery   . Tubal ligation   . Cesarean section     No family history on file.  History  Substance Use Topics  . Smoking status: Never Smoker   . Smokeless tobacco: Not on file  . Alcohol Use: No    OB History    Grav Para Term Preterm Abortions TAB SAB Ect Mult Living                  Review of Systems 10 Systems reviewed and are negative for acute change except as noted in the HPI.  Allergies  Review of patient's allergies indicates no known allergies.  Home Medications  No current outpatient prescriptions on file.  BP 142/101  Pulse 91  Temp(Src) 98.1 F (36.7 C) (Oral)  Resp 20  Ht 5\' 2"  (1.575 m)  Wt 242 lb 8 oz (109.997 kg)  BMI 44.35 kg/m2  SpO2 98%  LMP 04/03/2011  Physical Exam  Nursing note and vitals  reviewed. Constitutional: She is oriented to person, place, and time. She appears well-developed and well-nourished. No distress.  HENT:  Head: Normocephalic and atraumatic.  Right Ear: External ear normal.  Left Ear: External ear normal.  Eyes: Conjunctivae and EOM are normal. Pupils are equal, round, and reactive to light.  Neck: Normal range of motion. Neck supple. No tracheal deviation present.  Cardiovascular: Regular rhythm and normal heart sounds.  Exam reveals no gallop and no friction rub.   No murmur heard.      Tachycardic.  Pulmonary/Chest: Effort normal and breath sounds normal. No respiratory distress. She has no wheezes. She has no rales.       No rhonchi.  Abdominal: Soft. Bowel sounds are normal. She exhibits no distension.  Musculoskeletal: Normal range of motion. She exhibits no edema.  Neurological: She is alert and oriented to person, place, and time. No sensory deficit.  Skin: Skin is warm and dry. No rash noted.  Psychiatric: She has a normal mood and affect. Her behavior is normal.    ED Course  Procedures (including critical care time)  DIAGNOSTIC STUDIES: Oxygen Saturation is 98% on room air, normal by my interpretation.    COORDINATION OF CARE:  2304 Patient feeling better. Taking PO fluids.     MDM  Patient with headache  and vomiting for three days.Received antiemetic, IVF, analgesic with good relief. Pt feels improved after observation and/or treatment in ED.Pt stable in ED with no significant deterioration in condition.The patient appears reasonably screened and/or stabilized for discharge and I doubt any other medical condition or other [facility] requiring further screening, evaluation, or treatment in the ED at this time prior to discharge.   I personally performed the services described in this documentation, which was scribed in my presence. The recorded information has been reviewed and considered.  MDM Reviewed: nursing note and  vitals Interpretation: labs         Nicoletta Dress. Colon Branch, MD 04/24/11 [phone]

## 2011-07-22 ENCOUNTER — Emergency Department (HOSPITAL_COMMUNITY)
Admission: EM | Admit: 2011-07-22 | Discharge: 2011-07-22 | Disposition: A | Discharge status: Home / Self Care | Payer: Medicaid Other | Attending: Emergency Medicine | Admitting: Emergency Medicine

## 2011-07-22 ENCOUNTER — Encounter (HOSPITAL_COMMUNITY): Payer: Self-pay

## 2011-07-22 DIAGNOSIS — K529 Noninfective gastroenteritis and colitis, unspecified: Secondary | ICD-10-CM

## 2011-07-22 DIAGNOSIS — G40909 Epilepsy, unspecified, not intractable, without status epilepticus: Secondary | ICD-10-CM | POA: Insufficient documentation

## 2011-07-22 DIAGNOSIS — R000 Tachycardia, unspecified: Secondary | ICD-10-CM | POA: Insufficient documentation

## 2011-07-22 DIAGNOSIS — K5289 Other specified noninfective gastroenteritis and colitis: Secondary | ICD-10-CM | POA: Insufficient documentation

## 2011-07-22 DIAGNOSIS — R5381 Other malaise: Secondary | ICD-10-CM | POA: Insufficient documentation

## 2011-07-22 DIAGNOSIS — R29898 Other symptoms and signs involving the musculoskeletal system: Secondary | ICD-10-CM | POA: Insufficient documentation

## 2011-07-22 DIAGNOSIS — Z79899 Other long term (current) drug therapy: Secondary | ICD-10-CM | POA: Insufficient documentation

## 2011-07-22 DIAGNOSIS — G479 Sleep disorder, unspecified: Secondary | ICD-10-CM | POA: Insufficient documentation

## 2011-07-22 DIAGNOSIS — IMO0001 Reserved for inherently not codable concepts without codable children: Secondary | ICD-10-CM | POA: Insufficient documentation

## 2011-07-22 DIAGNOSIS — R1110 Vomiting, unspecified: Secondary | ICD-10-CM | POA: Insufficient documentation

## 2011-07-22 DIAGNOSIS — I10 Essential (primary) hypertension: Secondary | ICD-10-CM | POA: Insufficient documentation

## 2011-07-22 DIAGNOSIS — R630 Anorexia: Secondary | ICD-10-CM | POA: Insufficient documentation

## 2011-07-22 DIAGNOSIS — R509 Fever, unspecified: Secondary | ICD-10-CM | POA: Insufficient documentation

## 2011-07-22 LAB — CBC
HCT: 37.2 % (ref 36.0–46.0)
Hemoglobin: 12.2 g/dL (ref 12.0–15.0)
MCHC: 32.8 g/dL (ref 30.0–36.0)
MCV: 89.6 fL (ref 78.0–100.0)
WBC: 9.3 10*3/uL (ref 4.0–10.5)

## 2011-07-22 LAB — COMPREHENSIVE METABOLIC PANEL
ALT: 12 U/L (ref 0–35)
Alkaline Phosphatase: 80 U/L (ref 39–117)
CO2: 23 mEq/L (ref 19–32)
GFR calc Af Amer: 72 mL/min — ABNORMAL LOW (ref >90)
GFR calc non Af Amer: 63 mL/min — ABNORMAL LOW (ref >90)
Glucose, Bld: 96 mg/dL (ref 70–99)
Potassium: 3.3 mEq/L — ABNORMAL LOW (ref 3.5–5.1)
Sodium: 130 mEq/L — ABNORMAL LOW (ref 135–145)
Total Bilirubin: 0.7 mg/dL (ref 0.3–1.2)

## 2011-07-22 LAB — DIFFERENTIAL
Eosinophils Relative: 0 % (ref 0–5)
Lymphocytes Relative: 20 % (ref 12–46)
Monocytes Absolute: 0.7 10*3/uL (ref 0.1–1.0)
Monocytes Relative: 8 % (ref 3–12)
Neutro Abs: 6.7 10*3/uL (ref 1.7–7.7)

## 2011-07-22 MED ORDER — KETOROLAC TROMETHAMINE 30 MG/ML IJ SOLN
30.0000 mg | Freq: Once | INTRAMUSCULAR | Status: AC
  Administered 2011-07-22: 30 mg via INTRAVENOUS
  Filled 2011-07-22: qty 1

## 2011-07-22 MED ORDER — SODIUM CHLORIDE 0.9 % IV SOLN: Freq: Once | INTRAVENOUS | Status: DC

## 2011-07-22 MED ORDER — ONDANSETRON HCL 4 MG/2ML IJ SOLN
4.0000 mg | Freq: Once | INTRAMUSCULAR | Status: AC
  Administered 2011-07-22: 4 mg via INTRAVENOUS
  Filled 2011-07-22: qty 2

## 2011-07-22 MED ORDER — PROMETHAZINE HCL 25 MG PO TABS: 25.0000 mg | ORAL_TABLET | Freq: Four times a day (QID) | ORAL | Status: DC | PRN

## 2011-07-22 NOTE — ED Provider Notes (Addendum)
History   This chart was scribed for Geoffery Lyons, MD by Melba Coon. The patient was seen in room APA09/APA09 and the patient's care was started at 12:40PM.    CSN: [id number]  Arrival date & time 07/22/11  1227   First MD Initiated Contact with Patient 07/22/11 1236      Chief Complaint  Patient presents with  . Emesis  . Weakness  . Generalized Body Aches    (Consider location/radiation/quality/duration/timing/severity/associated sxs/prior treatment) HPI Denise Walters is a 31 y.o. female who presents to the Emergency Department complaining of persistent moderate to severe emesis with an onset last night. Pt also has weakness in lower extremities to the point where sometimes ambulation is altered and pt falls .Daughter has had Norovirus recently. Fever, tachycardia, generalized body aches, decreased appetite, and disturbed sleep present. No HA, neck pain, back pain, CP, SOB, abd pain, diarrhea, hematemesis, or extremity pain, weakness, numbness, or tingling. Pt has a Hx of HTN. Pt has had a cholecystectomy, breast surgery, tubal ligation, and C-section. No known allergies to medications. No other pertinent medical problems.  Past Medical History  Diagnosis Date  . Hypertension   . Seizures     Past Surgical History  Procedure Date  . Cholecystectomy   . Breast surgery   . Tubal ligation   . Cesarean section     No family history on file.  History  Substance Use Topics  . Smoking status: Never Smoker   . Smokeless tobacco: Not on file  . Alcohol Use: No    OB History    Grav Para Term Preterm Abortions TAB SAB Ect Mult Living                  Review of Systems 10 Systems reviewed and all are negative for acute change except as noted in the HPI.   Allergies  Chocolate  Home Medications   Current Outpatient Rx  Name Route Sig Dispense Refill  . LISINOPRIL-HYDROCHLOROTHIAZIDE 20-25 MG PO TABS Oral Take 1 tablet by mouth daily.      . TOPIRAMATE PO  Oral Take 1 tablet by mouth at bedtime.        Ht 5\' 2"  (1.575 m)  Wt 236 lb (107.049 kg)  BMI 43.17 kg/m2  LMP 07/13/2011  Physical Exam  Nursing note and vitals reviewed. Constitutional: She is oriented to person, place, and time. She appears well-developed and well-nourished. No distress.  HENT:  Head: Normocephalic and atraumatic.  Eyes: Pupils are equal, round, and reactive to light.  Neck: Normal range of motion. Neck supple.  Cardiovascular: Normal rate and regular rhythm.  Exam reveals no gallop and no friction rub.   No murmur heard. Pulmonary/Chest: Effort normal and breath sounds normal. No respiratory distress. She has no wheezes.  Abdominal: Soft. Bowel sounds are normal. She exhibits no distension. There is no tenderness.  Musculoskeletal: Normal range of motion.  Neurological: She is alert and oriented to person, place, and time. No cranial nerve deficit. She exhibits normal muscle tone. Coordination normal.  Skin: Skin is warm and dry. She is not diaphoretic.    ED Course  Procedures (including critical care time)  DIAGNOSTIC STUDIES: Oxygen Saturation is 98% on room air, normal by my interpretation.    COORDINATION OF CARE:  12:45PM - EDMD will order pain and antiinflammatory meds and also UA and blood w/u for the pt.   Labs Reviewed - No data to display No results found.   No diagnosis found.  MDM  The labs look okay and presentation is consistent with acute gastroenteritis.  Will discharge with phenergan, return prn.  I personally performed the services described in this documentation, which was scribed in my presence. The recorded information has been reviewed and considered.          Geoffery Lyons, MD 07/22/11 1435  Geoffery Lyons, MD 08/12/11 [phone]

## 2011-07-22 NOTE — ED Notes (Signed)
Pt presents with n/v, weakness, and generalized body aches since last night. No active vomiting noted at this time. Pt denies pain and SOB. IV established. Pt lying supine in stretcher. Stretcher in low locked position> Side rail up for pt safety. Call light within reach. Education on plan of care provided. Pt verbalized understanding. Awaiting further orders. Will continue to monitor.

## 2011-07-22 NOTE — Discharge Instructions (Signed)
Viral Gastroenteritis Viral gastroenteritis is also known as stomach flu. This condition affects the stomach and intestinal tract. It can cause sudden diarrhea and vomiting. The illness typically lasts 3 to 8 days. Most people develop an immune response that eventually gets rid of the virus. While this natural response develops, the virus can make you quite ill. CAUSES  Many different viruses can cause gastroenteritis, such as rotavirus or noroviruses. You can catch one of these viruses by consuming contaminated food or water. You may also catch a virus by sharing utensils or other personal items with an infected person or by touching a contaminated surface. SYMPTOMS  The most common symptoms are diarrhea and vomiting. These problems can cause a severe loss of body fluids (dehydration) and a body salt (electrolyte) imbalance. Other symptoms may include:  Fever.   Headache.   Fatigue.   Abdominal pain.  DIAGNOSIS  Your caregiver can usually diagnose viral gastroenteritis based on your symptoms and a physical exam. A stool sample may also be taken to test for the presence of viruses or other infections. TREATMENT  This illness typically goes away on its own. Treatments are aimed at rehydration. The most serious cases of viral gastroenteritis involve vomiting so severely that you are not able to keep fluids down. In these cases, fluids must be given through an intravenous line (IV). HOME CARE INSTRUCTIONS   Drink enough fluids to keep your urine clear or pale yellow. Drink small amounts of fluids frequently and increase the amounts as tolerated.   Ask your caregiver for specific rehydration instructions.   Avoid:   Foods high in sugar.   Alcohol.   Carbonated drinks.   Tobacco.   Juice.   Caffeine drinks.   Extremely hot or cold fluids.   Fatty, greasy foods.   Too much intake of anything at one time.   Dairy products until 24 to 48 hours after diarrhea stops.   You may  consume probiotics. Probiotics are active cultures of beneficial bacteria. They may lessen the amount and number of diarrheal stools in adults. Probiotics can be found in yogurt with active cultures and in supplements.   Wash your hands well to avoid spreading the virus.   Only take over-the-counter or prescription medicines for pain, discomfort, or fever as directed by your caregiver. Do not give aspirin to children. Antidiarrheal medicines are not recommended.   Ask your caregiver if you should continue to take your regular prescribed and over-the-counter medicines.   Keep all follow-up appointments as directed by your caregiver.  SEEK IMMEDIATE MEDICAL CARE IF:   You are unable to keep fluids down.   You do not urinate at least once every 6 to 8 hours.   You develop shortness of breath.   You notice blood in your stool or vomit. This may look like coffee grounds.   You have abdominal pain that increases or is concentrated in one small area (localized).   You have persistent vomiting or diarrhea.   You have a fever.   The patient is a child younger than 3 months, and he or she has a fever.   The patient is a child older than 3 months, and he or she has a fever and persistent symptoms.   The patient is a child older than 3 months, and he or she has a fever and symptoms suddenly get worse.   The patient is a baby, and he or she has no tears when crying.  MAKE SURE YOU:     Understand these instructions.   Will watch your condition.   Will get help right away if you are not doing well or get worse.  Document Released: 04/10/2005 Document Revised: 03/30/2011 Document Reviewed: 01/25/2011 [employer] Patient Information 2012 [employer]. 

## 2011-07-22 NOTE — ED Notes (Signed)
Pt presents with vomiting, weakness, and body aches since last night.

## 2011-07-22 NOTE — ED Notes (Signed)
Pt a/ox4. Resp even and unlabored. NAD at this time. D/C instructions reviewed with pt. Pt verbalized understanding. Pt ambulated to lobby with steady gate.  

## 2011-07-29 ENCOUNTER — Encounter (HOSPITAL_COMMUNITY): Payer: Self-pay | Admitting: Emergency Medicine

## 2011-07-29 ENCOUNTER — Emergency Department (HOSPITAL_COMMUNITY)
Admission: EM | Admit: 2011-07-29 | Discharge: 2011-07-29 | Disposition: A | Discharge status: Home / Self Care | Payer: Medicaid Other | Attending: Emergency Medicine | Admitting: Emergency Medicine

## 2011-07-29 DIAGNOSIS — I10 Essential (primary) hypertension: Secondary | ICD-10-CM | POA: Insufficient documentation

## 2011-07-29 DIAGNOSIS — R51 Headache: Secondary | ICD-10-CM

## 2011-07-29 MED ORDER — DIPHENHYDRAMINE HCL 50 MG/ML IJ SOLN
25.0000 mg | Freq: Once | INTRAMUSCULAR | Status: AC
  Administered 2011-07-29: 25 mg via INTRAVENOUS
  Filled 2011-07-29: qty 1

## 2011-07-29 MED ORDER — KETOROLAC TROMETHAMINE 60 MG/2ML IM SOLN
60.0000 mg | Freq: Once | INTRAMUSCULAR | Status: AC
  Administered 2011-07-29: 60 mg via INTRAMUSCULAR
  Filled 2011-07-29: qty 2

## 2011-07-29 MED ORDER — BUTALBITAL-APAP-CAFFEINE 50-325-40 MG PO TABS: 1.0000 | ORAL_TABLET | Freq: Four times a day (QID) | ORAL | Status: DC | PRN

## 2011-07-29 MED ORDER — METOCLOPRAMIDE HCL 5 MG/ML IJ SOLN
10.0000 mg | Freq: Once | INTRAMUSCULAR | Status: AC
  Administered 2011-07-29: 10 mg via INTRAMUSCULAR
  Filled 2011-07-29: qty 2

## 2011-07-29 NOTE — ED Provider Notes (Signed)
History     CSN: [id number]  Arrival date & time 07/29/11  0800   First MD Initiated Contact with Patient 07/29/11 [phone]      Chief Complaint  Patient presents with  . Headache    (Consider location/radiation/quality/duration/timing/severity/associated sxs/prior treatment) HPI Comments: Patient complains of a right-sided headache is been intermittent for several weeks. She states the headache was gradual in onset. She describes the headache as a throbbing sensation to her right temple and behind her right eye. She states she has been taking over-the-counter medication without relief. She also states that at times she feels lightheaded. She denies neck pain or stiffness, visual changes, vomiting, chest pain or dyspnea.  Patient is a 31 y.o. female presenting with headaches. The history is provided by the patient.  Headache  This is a recurrent problem. The current episode started more than 1 week ago. Episode frequency: intermitently. The problem has not changed since onset.The headache is associated with nothing. The pain is located in the right unilateral region. The quality of the pain is described as throbbing. The pain is moderate. The pain does not radiate. Associated symptoms include nausea. Pertinent negatives include no fever, no malaise/fatigue, no chest pressure, no near-syncope, no palpitations, no syncope, no shortness of breath and no vomiting. Treatments tried: phenergan. The treatment provided no relief.    Past Medical History  Diagnosis Date  . Hypertension   . Seizures     Past Surgical History  Procedure Date  . Cholecystectomy   . Breast surgery   . Tubal ligation   . Cesarean section     History reviewed. No pertinent family history.  History  Substance Use Topics  . Smoking status: Never Smoker   . Smokeless tobacco: Not on file  . Alcohol Use: No    OB History    Grav Para Term Preterm Abortions TAB SAB Ect Mult Living                  Review of  Systems  Constitutional: Negative for fever and malaise/fatigue.  HENT: Negative for neck pain and neck stiffness.   Eyes: Negative for photophobia and visual disturbance.  Respiratory: Negative for shortness of breath.   Cardiovascular: Negative for chest pain, palpitations, syncope and near-syncope.  Gastrointestinal: Positive for nausea. Negative for vomiting and abdominal pain.  Neurological: Positive for light-headedness and headaches. Negative for dizziness, facial asymmetry, speech difficulty, weakness and numbness.  All other systems reviewed and are negative.    Allergies  Chocolate  Home Medications   Current Outpatient Rx  Name Route Sig Dispense Refill  . LISINOPRIL-HYDROCHLOROTHIAZIDE 20-25 MG PO TABS Oral Take 1 tablet by mouth daily.      Marland Kitchen PROMETHAZINE HCL 25 MG PO TABS Oral Take 0.5 tablets (12.5 mg total) by mouth every 6 (six) hours as needed for nausea. 10 tablet 0  . PROMETHAZINE HCL 25 MG PO TABS Oral Take 1 tablet (25 mg total) by mouth every 6 (six) hours as needed for nausea. 12 tablet 0  . TOPIRAMATE 25 MG PO TABS Oral Take 25 mg by mouth at bedtime.      BP 136/95  Pulse 112  Resp 18  Ht 5\' 2"  (1.575 m)  Wt 230 lb (104.327 kg)  BMI 42.07 kg/m2  SpO2 99%  LMP 07/13/2011  Physical Exam  Nursing note and vitals reviewed. Constitutional: She is oriented to person, place, and time. She appears well-developed and well-nourished. No distress.  HENT:  Head: Normocephalic and  atraumatic.  Mouth/Throat: Oropharynx is clear and moist.  Eyes: Conjunctivae and EOM are normal. Pupils are equal, round, and reactive to light.  Neck: Normal range of motion and phonation normal. Neck supple. No Brudzinski's sign and no Kernig's sign noted.  Cardiovascular: Normal rate, regular rhythm, normal heart sounds and intact distal pulses.   No murmur heard. Pulmonary/Chest: Effort normal and breath sounds normal. No respiratory distress.  Musculoskeletal: Normal range of  motion. She exhibits no tenderness.  Lymphadenopathy:    She has no cervical adenopathy.  Neurological: She is alert and oriented to person, place, and time. She has normal reflexes. She exhibits normal muscle tone. Coordination normal.  Skin: Skin is warm and dry.  Psychiatric: She has a normal mood and affect.    ED Course  Procedures (including critical care time)       MDM   Previous ED charts and nursing notes were reviewed by me   Patient has right sided headache of gradual onset for several weeks.  Headache is intermittent and feels similar to previous headaches.  No focal neuro deficits or meningeal signs.  She is ambulatory with a steady gait   Patient is feeling better and requesting to go home. Vital signs are stable. She is nontoxic appearing.  She is received IM injections of Toradol, Reglan, and Benadryl. Headache is similar to previous headaches and is likely related to a migraine. Patient appears stable for discharge, I will prescribe a short course of Fioricet, she agrees to close followup with her primary care physician at [facility] department or to return here if her symptoms worsen.    Kanesha Cadle L. [place], [place] 07/29/11 [phone]

## 2011-07-29 NOTE — ED Provider Notes (Signed)
Medical screening examination/treatment/procedure(s) were performed by non-physician practitioner and as supervising physician I was immediately available for consultation/collaboration.   Joya Gaskins, MD 07/29/11 1057

## 2011-07-29 NOTE — ED Notes (Addendum)
Patient reports throbbing frontal headache x 1.5 weeks. Patient denies vomiting.

## 2011-09-05 ENCOUNTER — Encounter (HOSPITAL_COMMUNITY): Payer: Self-pay | Admitting: *Deleted

## 2011-09-05 ENCOUNTER — Other Ambulatory Visit: Payer: Self-pay

## 2011-09-05 ENCOUNTER — Emergency Department (HOSPITAL_COMMUNITY): Payer: Medicaid Other

## 2011-09-05 ENCOUNTER — Emergency Department (HOSPITAL_COMMUNITY)
Admission: EM | Admit: 2011-09-05 | Discharge: 2011-09-05 | Disposition: A | Discharge status: Home / Self Care | Payer: Medicaid Other | Attending: Emergency Medicine | Admitting: Emergency Medicine

## 2011-09-05 DIAGNOSIS — H539 Unspecified visual disturbance: Secondary | ICD-10-CM | POA: Insufficient documentation

## 2011-09-05 DIAGNOSIS — I10 Essential (primary) hypertension: Secondary | ICD-10-CM | POA: Insufficient documentation

## 2011-09-05 DIAGNOSIS — R0789 Other chest pain: Secondary | ICD-10-CM | POA: Insufficient documentation

## 2011-09-05 LAB — CBC
MCH: 28.4 pg (ref 26.0–34.0)
MCHC: 31 g/dL (ref 30.0–36.0)
Platelets: 398 10*3/uL (ref 150–400)
RDW: 14.5 % (ref 11.5–15.5)

## 2011-09-05 LAB — DIFFERENTIAL
Basophils Relative: 0 % (ref 0–1)
Eosinophils Absolute: 0.1 10*3/uL (ref 0.0–0.7)
Monocytes Relative: 8 % (ref 3–12)
Neutrophils Relative %: 75 % (ref 43–77)

## 2011-09-05 LAB — COMPREHENSIVE METABOLIC PANEL
Albumin: 3.3 g/dL — ABNORMAL LOW (ref 3.5–5.2)
Alkaline Phosphatase: 83 U/L (ref 39–117)
BUN: 4 mg/dL — ABNORMAL LOW (ref 6–23)
Potassium: 3.6 mEq/L (ref 3.5–5.1)
Total Protein: 8.1 g/dL (ref 6.0–8.3)

## 2011-09-05 LAB — TROPONIN I: Troponin I: <0.3 ng/mL (ref <0.30)

## 2011-09-05 MED ORDER — KETOROLAC TROMETHAMINE 60 MG/2ML IM SOLN
60.0000 mg | Freq: Once | INTRAMUSCULAR | Status: AC
  Administered 2011-09-05: 60 mg via INTRAMUSCULAR
  Filled 2011-09-05 (×2): qty 2

## 2011-09-05 NOTE — Discharge Instructions (Signed)
Chest Pain (Nonspecific) It is often hard to give a specific diagnosis for the cause of chest pain. There is always a chance that your pain could be related to something serious, such as a heart attack or a blood clot in the lungs. You need to follow up with your caregiver for further evaluation. CAUSES   Heartburn.   Pneumonia or bronchitis.   Anxiety or stress.   Inflammation around your heart (pericarditis) or lung (pleuritis or pleurisy).   A blood clot in the lung.   A collapsed lung (pneumothorax). It can develop suddenly on its own (spontaneous pneumothorax) or from injury (trauma) to the chest.   Shingles infection (herpes zoster virus).  The chest wall is composed of bones, muscles, and cartilage. Any of these can be the source of the pain.  The bones can be bruised by injury.   The muscles or cartilage can be strained by coughing or overwork.   The cartilage can be affected by inflammation and become sore (costochondritis).  DIAGNOSIS  Lab tests or other studies, such as X-rays, electrocardiography, stress testing, or cardiac imaging, may be needed to find the cause of your pain.  TREATMENT   Treatment depends on what may be causing your chest pain. Treatment may include:   Acid blockers for heartburn.   Anti-inflammatory medicine.   Pain medicine for inflammatory conditions.   Antibiotics if an infection is present.   You may be advised to change lifestyle habits. This includes stopping smoking and avoiding alcohol, caffeine, and chocolate.   You may be advised to keep your head raised (elevated) when sleeping. This reduces the chance of acid going backward from your stomach into your esophagus.   Most of the time, nonspecific chest pain will improve within 2 to 3 days with rest and mild pain medicine.  HOME CARE INSTRUCTIONS   If antibiotics were prescribed, take your antibiotics as directed. Finish them even if you start to feel better.   For the next few  days, avoid physical activities that bring on chest pain. Continue physical activities as directed.   Do not smoke.   Avoid drinking alcohol.   Only take over-the-counter or prescription medicine for pain, discomfort, or fever as directed by your caregiver.   Follow your caregiver's suggestions for further testing if your chest pain does not go away.   Keep any follow-up appointments you made. If you do not go to an appointment, you could develop lasting (chronic) problems with pain. If there is any problem keeping an appointment, you must call to reschedule.  SEEK MEDICAL CARE IF:   You think you are having problems from the medicine you are taking. Read your medicine instructions carefully.   Your chest pain does not go away, even after treatment.   You develop a rash with blisters on your chest.  SEEK IMMEDIATE MEDICAL CARE IF:   You have increased chest pain or pain that spreads to your arm, neck, jaw, back, or abdomen.   You develop shortness of breath, an increasing cough, or you are coughing up blood.   You have severe back or abdominal pain, feel nauseous, or vomit.   You develop severe weakness, fainting, or chills.   You have a fever.  THIS IS AN EMERGENCY. Do not wait to see if the pain will go away. Get medical help at once. Call your local emergency services (911 in [place] Do not drive yourself to the hospital. MAKE SURE YOU:   Understand these instructions.     Will watch your condition.   Will get help right away if you are not doing well or get worse.  Document Released: 01/18/2005 Document Revised: 03/30/2011 Document Reviewed: 11/14/2007 [employer] Patient Information 2012 [employer]. 

## 2011-09-05 NOTE — ED Notes (Signed)
Chest pain for 3 days , and headache, no cough.

## 2011-09-05 NOTE — ED Provider Notes (Signed)
History  Scribed for Geoffery Lyons, MD, the patient was seen in room APA01/APA01. This chart was scribed by Candelaria Stagers. The patient's care started at 6:01 PM    CSN: [id number]  Arrival date & time 09/05/11  4098   First MD Initiated Contact with Patient 09/05/11 1757      Chief Complaint  Patient presents with  . Chest Pain    Patient is a 31 y.o. female presenting with chest pain. The history is provided by the patient.  Chest Pain Pertinent negatives for primary symptoms include no fever, no cough, no nausea and no vomiting.    Denise Walters is a 31 y.o. female who presents to the Emergency Department complaining of constant chest pain and headaches that started three days ago and has gotten worse today.  Pt denies fever, chills, nausea, or vomiting.  She reports that she gets dizzy and has blurry vision when standing up.  Breathing deep makes the pain worse.  Nothing seems to make the pain better or worse. Pt has h/o HTN.  Past Medical History  Diagnosis Date  . Hypertension   . Seizures     Past Surgical History  Procedure Date  . Cholecystectomy   . Breast surgery   . Tubal ligation   . Cesarean section     History reviewed. No pertinent family history.  History  Substance Use Topics  . Smoking status: Never Smoker   . Smokeless tobacco: Not on file  . Alcohol Use: No    OB History    Grav Para Term Preterm Abortions TAB SAB Ect Mult Living                  Review of Systems  Constitutional: Negative for fever and chills.  Eyes: Positive for visual disturbance.  Respiratory: Negative for cough.   Cardiovascular: Positive for chest pain.  Gastrointestinal: Negative for nausea and vomiting.  Neurological: Positive for headaches.  All other systems reviewed and are negative.    Allergies  Chocolate  Home Medications   Current Outpatient Rx  Name Route Sig Dispense Refill  . BUTALBITAL-APAP-CAFFEINE 50-325-40 MG PO TABS Oral Take 1-2 tablets  by mouth every 6 (six) hours as needed for headache. 15 tablet 0  . LISINOPRIL-HYDROCHLOROTHIAZIDE 20-25 MG PO TABS Oral Take 1 tablet by mouth daily.      Marland Kitchen PROMETHAZINE HCL 25 MG PO TABS Oral Take 0.5 tablets (12.5 mg total) by mouth every 6 (six) hours as needed for nausea. 10 tablet 0  . PROMETHAZINE HCL 25 MG PO TABS Oral Take 1 tablet (25 mg total) by mouth every 6 (six) hours as needed for nausea. 12 tablet 0  . TOPIRAMATE 25 MG PO TABS Oral Take 25 mg by mouth at bedtime.      BP 155/102  Pulse 115  Temp(Src) 98.7 F (37.1 C) (Oral)  Resp 20  Ht 5\' 2"  (1.575 m)  Wt 246 lb (111.585 kg)  BMI 44.99 kg/m2  SpO2 98%  LMP 08/25/2011  Physical Exam  Nursing note and vitals reviewed. Constitutional: She is oriented to person, place, and time. She appears well-developed and well-nourished.  HENT:  Head: Normocephalic and atraumatic.  Eyes: Conjunctivae and EOM are normal. Pupils are equal, round, and reactive to light.  Neck: Normal range of motion. Neck supple.  Cardiovascular: Normal rate and regular rhythm.   Pulmonary/Chest: Effort normal and breath sounds normal. She has no wheezes. She has no rales. She exhibits tenderness (tender to palpation  on the anterior chest wall).  Abdominal: Soft. There is no tenderness.  Musculoskeletal: Normal range of motion. She exhibits no edema.  Neurological: She is alert and oriented to person, place, and time.  Skin: Skin is warm and dry.  Psychiatric: She has a normal mood and affect. Her behavior is normal. Judgment and thought content normal.    ED Course  Procedures   DIAGNOSTIC STUDIES: Oxygen Saturation is 98% on room air, normal by my interpretation.    COORDINATION OF CARE:  6:04PM Comprehensive metabolic panel ; Troponin I ; DG Chest 2 View ; ketorolac (TORADOL) injection 60 mg      Labs Reviewed  CBC  DIFFERENTIAL  COMPREHENSIVE METABOLIC PANEL  TROPONIN I   Dg Chest 2 View  09/05/2011  *RADIOLOGY REPORT*   Clinical Data: Chest pain.  CHEST - 2 VIEW  Comparison: 06/20/2010  Findings: Cardiac and mediastinal contours appear normal.  The lungs appear clear.  No pleural effusion is identified.  IMPRESSION:  No significant abnormality identified.  Original Report Authenticated By: Dellia Cloud, M.D.     No diagnosis found.   Date: 09/05/2011  Rate: 111  Rhythm: sinus tachycardia  QRS Axis: normal  Intervals: normal  ST/T Wave abnormalities: normal  Conduction Disutrbances:none  Narrative Interpretation:   Old EKG Reviewed: unchanged     MDM  The ekg shows no changes and the xray, labs are normal as well.  This is most likely not cardiac and most likely is musculoskeletal in nature.  She will be discharged with nsaids, to return prn if she worsens.     I personally performed the services described in this documentation, which was scribed in my presence. The recorded information has been reviewed and considered.           Geoffery Lyons, MD 09/05/11 [phone]

## 2011-09-07 ENCOUNTER — Encounter (HOSPITAL_COMMUNITY): Payer: Self-pay | Admitting: *Deleted

## 2011-09-07 ENCOUNTER — Emergency Department (HOSPITAL_COMMUNITY): Payer: Medicaid Other

## 2011-09-07 ENCOUNTER — Emergency Department (HOSPITAL_COMMUNITY)
Admission: EM | Admit: 2011-09-07 | Discharge: 2011-09-08 | Disposition: A | Discharge status: Home / Self Care | Payer: Medicaid Other | Attending: Emergency Medicine | Admitting: Emergency Medicine

## 2011-09-07 DIAGNOSIS — R509 Fever, unspecified: Secondary | ICD-10-CM | POA: Insufficient documentation

## 2011-09-07 DIAGNOSIS — J029 Acute pharyngitis, unspecified: Secondary | ICD-10-CM

## 2011-09-07 DIAGNOSIS — R51 Headache: Secondary | ICD-10-CM | POA: Insufficient documentation

## 2011-09-07 DIAGNOSIS — Z79899 Other long term (current) drug therapy: Secondary | ICD-10-CM | POA: Insufficient documentation

## 2011-09-07 DIAGNOSIS — J039 Acute tonsillitis, unspecified: Secondary | ICD-10-CM | POA: Insufficient documentation

## 2011-09-07 DIAGNOSIS — R000 Tachycardia, unspecified: Secondary | ICD-10-CM | POA: Insufficient documentation

## 2011-09-07 DIAGNOSIS — R1310 Dysphagia, unspecified: Secondary | ICD-10-CM | POA: Insufficient documentation

## 2011-09-07 MED ORDER — OXYCODONE-ACETAMINOPHEN 5-325 MG/5ML PO SOLN
5.0000 mL | Freq: Once | ORAL | Status: AC
  Administered 2011-09-07: 5 mL via ORAL

## 2011-09-07 MED ORDER — ONDANSETRON 4 MG PO TBDP
4.0000 mg | ORAL_TABLET | Freq: Once | ORAL | Status: AC
  Administered 2011-09-07: 4 mg via ORAL
  Filled 2011-09-07: qty 1

## 2011-09-07 MED ORDER — IOHEXOL 300 MG/ML  SOLN
75.0000 mL | Freq: Once | INTRAMUSCULAR | Status: AC | PRN
  Administered 2011-09-07: 75 mL via INTRAVENOUS

## 2011-09-07 NOTE — ED Notes (Signed)
Pt reports sore throat starting 2 days ago

## 2011-09-07 NOTE — ED Notes (Signed)
Moved to main ED room # 5

## 2011-09-07 NOTE — ED Notes (Signed)
Sore throat x two days  Painful to swallow and when tries to drink something it comes back up ? Vomits vs cannot swallow.

## 2011-09-08 MED ORDER — PENICILLIN G BENZATHINE 1200000 UNIT/2ML IM SUSP
1.2000 10*6.[IU] | Freq: Once | INTRAMUSCULAR | Status: AC
  Administered 2011-09-08: 1.2 10*6.[IU] via INTRAMUSCULAR
  Filled 2011-09-08: qty 2

## 2011-09-08 MED ORDER — ACETAMINOPHEN-CODEINE 120-12 MG/5ML PO SUSP: 10.0000 mL | ORAL | Status: AC

## 2011-09-08 NOTE — ED Notes (Signed)
Advised will need to wait for  A few more minutes to insure no reaction to PCN injection then will be ready for discharge

## 2011-09-08 NOTE — Discharge Instructions (Signed)
Your ct scan is negative for abscess. Please use salt water gargles 2 times daily. Use tylenol or motrin for mild pain. Use tylenol-codeine for more severe pain. This may cause drowsiness, use with caution.Sore Throat Sore throats may be caused by bacteria and viruses. They may also be caused by:  Smoking.   Pollution.   Allergies.  If a sore throat is due to strep infection (a bacterial infection), you may need:  A throat swab.   A culture test to verify the strep infection.  You will need one of these:  An antibiotic shot.   Oral medicine for a full 10 days.  Strep infection is very contagious. A doctor should check any close contacts who have a sore throat or fever. A sore throat caused by a virus infection will usually last only 3-4 days. Antibiotics will not treat a viral sore throat.  Infectious mononucleosis (a viral disease), however, can cause a sore throat that lasts for up to 3 weeks. Mononucleosis can be diagnosed with blood tests. You must have been sick for at least 1 week in order for the test to give accurate results. HOME CARE INSTRUCTIONS   To treat a sore throat, take mild pain medicine.   Increase your fluids.   Eat a soft diet.   Do not smoke.   Gargling with warm water or salt water (1 tsp. salt in 8 oz. water) can be helpful.   Try throat sprays or lozenges or sucking on hard candy to ease the symptoms.  Call your doctor if your sore throat lasts longer than 1 week.  SEEK IMMEDIATE MEDICAL CARE IF:  You have difficulty breathing.   You have increased swelling in the throat.   You have pain so severe that you are unable to swallow fluids or your saliva.   You have a severe headache, a high fever, vomiting, or a red rash.  Document Released: 05/18/2004 Document Revised: 03/30/2011 Document Reviewed: 03/28/2007 [facility] Patient Information 2012 [place], [place].

## 2011-09-08 NOTE — ED Provider Notes (Signed)
History     CSN: [id number]  Arrival date & time 09/07/11  1946   First MD Initiated Contact with Patient 09/07/11 2201      Chief Complaint  Patient presents with  . Sore Throat    (Consider location/radiation/quality/duration/timing/severity/associated sxs/prior treatment) Patient is a 31 y.o. female presenting with pharyngitis. The history is provided by the patient.  Sore Throat This is a new problem. The current episode started in the past 7 days. The problem occurs constantly. The problem has been gradually worsening. Associated symptoms include a fever, headaches, myalgias and a sore throat. Pertinent negatives include no abdominal pain, arthralgias, chest pain, coughing or neck pain. The symptoms are aggravated by swallowing. She has tried acetaminophen for the symptoms. The treatment provided no relief.    Past Medical History  Diagnosis Date  . Hypertension   . Seizures     Past Surgical History  Procedure Date  . Cholecystectomy   . Breast surgery   . Tubal ligation   . Cesarean section     No family history on file.  History  Substance Use Topics  . Smoking status: Never Smoker   . Smokeless tobacco: Not on file  . Alcohol Use: No    OB History    Grav Para Term Preterm Abortions TAB SAB Ect Mult Living                  Review of Systems  Constitutional: Positive for fever. Negative for activity change.       All ROS Neg except as noted in HPI  HENT: Positive for sore throat. Negative for nosebleeds and neck pain.   Eyes: Negative for photophobia and discharge.  Respiratory: Negative for cough, shortness of breath and wheezing.   Cardiovascular: Negative for chest pain and palpitations.  Gastrointestinal: Negative for abdominal pain and blood in stool.  Genitourinary: Negative for dysuria, frequency and hematuria.  Musculoskeletal: Positive for myalgias. Negative for back pain and arthralgias.  Skin: Negative.   Neurological: Positive for  seizures and headaches. Negative for dizziness and speech difficulty.  Psychiatric/Behavioral: Negative for hallucinations and confusion.    Allergies  Chocolate  Home Medications   Current Outpatient Rx  Name Route Sig Dispense Refill  . LISINOPRIL-HYDROCHLOROTHIAZIDE 20-25 MG PO TABS Oral Take 1 tablet by mouth daily.      . TOPIRAMATE 25 MG PO TABS Oral Take 25 mg by mouth at bedtime.    Marland Kitchen PROMETHAZINE HCL 25 MG PO TABS Oral Take 0.5 tablets (12.5 mg total) by mouth every 6 (six) hours as needed for nausea. 10 tablet 0  . PROMETHAZINE HCL 25 MG PO TABS Oral Take 1 tablet (25 mg total) by mouth every 6 (six) hours as needed for nausea. 12 tablet 0    BP 119/75  Pulse 120  Temp(Src) 98.6 F (37 C) (Oral)  Resp 18  Ht 5\' 2"  (1.575 m)  Wt 246 lb (111.585 kg)  BMI 44.99 kg/m2  SpO2 96%  LMP 08/25/2011  Physical Exam  Nursing note and vitals reviewed. Constitutional: She is oriented to person, place, and time. She appears well-developed and well-nourished.  Non-toxic appearance.  HENT:  Head: Normocephalic.  Right Ear: Tympanic membrane and external ear normal.  Left Ear: Tympanic membrane and external ear normal.       Bilat exudate of the posterior pharynx. Left tonsil larger than right. Air way patent. Pt having some problem mobilizing secretions.  Eyes: EOM and lids are normal. Pupils are equal,  round, and reactive to light.  Neck: Normal range of motion. Neck supple. Carotid bruit is not present.  Cardiovascular: Regular rhythm, normal heart sounds, intact distal pulses and normal pulses.  Tachycardia present.   Pulmonary/Chest: Breath sounds normal. No respiratory distress.  Abdominal: Soft. Bowel sounds are normal. There is no tenderness. There is no guarding.  Musculoskeletal: Normal range of motion.  Lymphadenopathy:       Head (right side): No submandibular adenopathy present.       Head (left side): No submandibular adenopathy present.    She has no cervical  adenopathy.  Neurological: She is alert and oriented to person, place, and time. She has normal strength. No cranial nerve deficit or sensory deficit.  Skin: Skin is warm and dry.  Psychiatric: She has a normal mood and affect. Her speech is normal.    ED Course  Procedures (including critical care time)   Labs Reviewed  RAPID STREP SCREEN   Ct Soft Tissue Neck W Contrast  09/07/2011  *RADIOLOGY REPORT*  Clinical Data: Sore throat.  Difficulty swallowing.  CT NECK WITH CONTRAST  Technique:  Multidetector CT imaging of the neck was performed with intravenous contrast.  Contrast: 75mL OMNIPAQUE IOHEXOL 300 MG/ML  SOLN  Comparison: 09/05/2011 chest radiograph  Findings: Prominent adenoidal tissue noted with very prominent but symmetric tonsillar pillars and moderate prominence of the palatine tonsils.  Moderate lingual tonsillar prominence noted.  The glottis and aryepiglottic folds appear normal, as do the glottic structures.  The vallecula and piriform sinuses appear unremarkable, as does the thyroid gland.  Small upper mediastinal lymph nodes are present.  A left supraclavicular node has a short axis diameter 1.3 cm, mildly abnormally enlarged.  A left station II lymph node measures a left station II lymph node measures 1.7 cm in short axis and a right station II lymph node measures 1.6 cm in short axis.  Scattered additional station II and station III lymph nodes are present.  The parapharyngeal spaces appear normal and symmetric.  Visualized paranasal sinuses appear unremarkable.  There is mild mucosal thickening in the nasal cavity.  No tonsillar, retropharyngeal, or neck abscess is observed.  IMPRESSION:  1.  Abnormal prominence of the tonsillar pillars, with moderate prominence of the lingual and palatine tonsillar tissue and prominent adenoidal tissue, particularly for age.  Given the bilateral symmetry and symmetric adenopathy in the neck, this probably represents a reactive tonsillar  hypertrophy and adenopathy.  If it fails to resolve then tissue diagnosis may be warranted. 2.  The epiglottis and aryepiglottic folds appear normal, as do glottic structures. 3.  No abscess.  Original Report Authenticated By: Dellia Cloud, M.D.     No diagnosis found.    MDM  I have reviewed nursing notes, vital signs, and all appropriate lab and imaging results for this patient. CT scan negative for tonsilar abscess. The epiglotis is wnl. Will treat for pharyngitis/tonsilitis. Bicillin LA given. Rx for tylenol-codeine liquid given       Kathie Dike, [place] 09/08/11 0021

## 2011-09-10 NOTE — ED Provider Notes (Signed)
Medical screening examination/treatment/procedure(s) were performed by non-physician practitioner and as supervising physician I was immediately available for consultation/collaboration.  Donnetta Hutching, MD 09/10/11 [phone]

## 2012-04-19 ENCOUNTER — Emergency Department (HOSPITAL_COMMUNITY)
Admission: EM | Admit: 2012-04-19 | Discharge: 2012-04-19 | Disposition: A | Discharge status: Home / Self Care | Payer: Medicaid Other | Attending: Emergency Medicine | Admitting: Emergency Medicine

## 2012-04-19 ENCOUNTER — Encounter (HOSPITAL_COMMUNITY): Payer: Self-pay | Admitting: Emergency Medicine

## 2012-04-19 DIAGNOSIS — R197 Diarrhea, unspecified: Secondary | ICD-10-CM | POA: Insufficient documentation

## 2012-04-19 DIAGNOSIS — G40909 Epilepsy, unspecified, not intractable, without status epilepticus: Secondary | ICD-10-CM | POA: Insufficient documentation

## 2012-04-19 DIAGNOSIS — I10 Essential (primary) hypertension: Secondary | ICD-10-CM | POA: Insufficient documentation

## 2012-04-19 DIAGNOSIS — Z79899 Other long term (current) drug therapy: Secondary | ICD-10-CM | POA: Insufficient documentation

## 2012-04-19 DIAGNOSIS — R112 Nausea with vomiting, unspecified: Secondary | ICD-10-CM

## 2012-04-19 MED ORDER — ONDANSETRON HCL 4 MG/2ML IJ SOLN
4.0000 mg | Freq: Once | INTRAMUSCULAR | Status: AC
  Administered 2012-04-19: 4 mg via INTRAVENOUS
  Filled 2012-04-19: qty 2

## 2012-04-19 MED ORDER — SODIUM CHLORIDE 0.9 % IV BOLUS (SEPSIS)
1000.0000 mL | Freq: Once | INTRAVENOUS | Status: AC
  Administered 2012-04-19: 1000 mL via INTRAVENOUS

## 2012-04-19 MED ORDER — MORPHINE SULFATE 4 MG/ML IJ SOLN
2.0000 mg | Freq: Once | INTRAMUSCULAR | Status: AC
Start: 2012-04-19 — End: 2012-04-19
  Administered 2012-04-19: 2 mg via INTRAVENOUS
  Filled 2012-04-19: qty 1

## 2012-04-19 MED ORDER — PROMETHAZINE HCL 25 MG PO TABS
12.5000 mg | ORAL_TABLET | Freq: Four times a day (QID) | ORAL | Status: DC | PRN
Start: 2012-04-19 — End: 2013-10-19

## 2012-04-19 MED ORDER — MORPHINE SULFATE 4 MG/ML IJ SOLN
2.0000 mg | Freq: Once | INTRAMUSCULAR | Status: AC
  Administered 2012-04-19: 2 mg via INTRAVENOUS
  Filled 2012-04-19: qty 1

## 2012-04-19 MED ORDER — PROMETHAZINE HCL 25 MG RE SUPP: 25.0000 mg | Freq: Four times a day (QID) | RECTAL | Status: DC | PRN

## 2012-04-19 MED ORDER — DIPHENOXYLATE-ATROPINE 2.5-0.025 MG PO TABS
2.0000 | ORAL_TABLET | Freq: Once | ORAL | Status: AC
  Administered 2012-04-19: 2 via ORAL
  Filled 2012-04-19: qty 2

## 2012-04-19 NOTE — ED Provider Notes (Signed)
History     CSN: [id number]  Arrival date & time 04/19/12  7829   First MD Initiated Contact with Patient 04/19/12 0114      Chief Complaint  Patient presents with  . Emesis    (Consider location/radiation/quality/duration/timing/severity/associated sxs/prior treatment) HPI Denise Walters is a 31 y.o. female who presents to the Emergency Department complaining of nausea, vomiting and diarrhea that began 2 hours ago. Multiple episodes of vomiting and diarrhea.  Denies fever, chills, chest pain, shortness of breath.   PCP Dr. Cecille Aver  Past Medical History  Diagnosis Date  . Hypertension   . Seizures     Past Surgical History  Procedure Date  . Cholecystectomy   . Breast surgery   . Tubal ligation   . Cesarean section     No family history on file.  History  Substance Use Topics  . Smoking status: Never Smoker   . Smokeless tobacco: Not on file  . Alcohol Use: No    OB History    Grav Para Term Preterm Abortions TAB SAB Ect Mult Living                  Review of Systems  Constitutional: Negative for fever.       10 Systems reviewed and are negative for acute change except as noted in the HPI.  HENT: Negative for congestion.   Eyes: Negative for discharge and redness.  Respiratory: Negative for cough and shortness of breath.   Cardiovascular: Negative for chest pain.  Gastrointestinal: Negative for vomiting and abdominal pain.  Musculoskeletal: Negative for back pain.  Skin: Negative for rash.  Neurological: Negative for syncope, numbness and headaches.  Psychiatric/Behavioral:       No behavior change.    Allergies  Chocolate  Home Medications   Current Outpatient Rx  Name  Route  Sig  Dispense  Refill  . LISINOPRIL-HYDROCHLOROTHIAZIDE 20-25 MG PO TABS   Oral   Take 1 tablet by mouth daily.           . TOPIRAMATE 25 MG PO TABS   Oral   Take 25 mg by mouth at bedtime.         Marland Kitchen PROMETHAZINE HCL 25 MG PO TABS   Oral   Take 0.5  tablets (12.5 mg total) by mouth every 6 (six) hours as needed for nausea.   10 tablet   0   . PROMETHAZINE HCL 25 MG PO TABS   Oral   Take 1 tablet (25 mg total) by mouth every 6 (six) hours as needed for nausea.   12 tablet   0     BP 163/94  Pulse 115  Temp 98.2 F (36.8 C) (Oral)  Resp 20  Ht 5\' 2"  (1.575 m)  Wt 238 lb (107.956 kg)  BMI 43.53 kg/m2  SpO2 100%  LMP 04/13/2012  Physical Exam  Nursing note and vitals reviewed. Constitutional:       Awake, alert,actively vomiting.  HENT:  Head: Atraumatic.  Eyes: Right eye exhibits no discharge. Left eye exhibits no discharge.  Neck: Neck supple.  Cardiovascular: Normal heart sounds.   Pulmonary/Chest: Effort normal and breath sounds normal. She exhibits no tenderness.  Abdominal: Soft. There is no tenderness. There is no rebound.       Hyperactive bowel sounds  Musculoskeletal: She exhibits no tenderness.       Baseline ROM, no obvious new focal weakness.  Neurological:       Mental status and motor  strength appears baseline for patient and situation.  Skin: No rash noted.  Psychiatric: She has a normal mood and affect.    ED Course  Procedures (including critical care time)    0415 Continued nausea and episode of vomiting. Diarrhea has slowed down. Having some abdominal cramping. Ordered additional morphine and zofran. 0530 Feels a little better.  MDM  Patient with nausea, vomiting, diarrhea illness that began tonight.Given IVF, antiemetic x 2, analgesic x 2 with some relief. Pt stable in ED with no significant deterioration in condition.The patient appears reasonably screened and/or stabilized for discharge and I doubt any other medical condition or other [facility] requiring further screening, evaluation, or treatment in the ED at this time prior to discharge.  MDM Reviewed: nursing note and vitals           Nicoletta Dress. Colon Branch, MD 04/19/12 [phone]

## 2012-04-19 NOTE — ED Notes (Signed)
Patient c/o vomiting x 2 hours.

## 2012-04-19 NOTE — ED Notes (Signed)
Patient ambulatory to restroom  ?

## 2013-10-17 ENCOUNTER — Encounter (HOSPITAL_COMMUNITY): Payer: Self-pay | Admitting: Emergency Medicine

## 2013-10-17 ENCOUNTER — Emergency Department (HOSPITAL_COMMUNITY)
Admission: EM | Admit: 2013-10-17 | Discharge: 2013-10-17 | Disposition: A | Discharge status: Home / Self Care | Payer: Medicaid Other | Attending: Emergency Medicine | Admitting: Emergency Medicine

## 2013-10-17 DIAGNOSIS — Z79899 Other long term (current) drug therapy: Secondary | ICD-10-CM | POA: Diagnosis not present

## 2013-10-17 DIAGNOSIS — I10 Essential (primary) hypertension: Secondary | ICD-10-CM | POA: Diagnosis not present

## 2013-10-17 DIAGNOSIS — R51 Headache: Secondary | ICD-10-CM | POA: Diagnosis present

## 2013-10-17 DIAGNOSIS — G40909 Epilepsy, unspecified, not intractable, without status epilepticus: Secondary | ICD-10-CM | POA: Insufficient documentation

## 2013-10-17 DIAGNOSIS — G44209 Tension-type headache, unspecified, not intractable: Secondary | ICD-10-CM | POA: Insufficient documentation

## 2013-10-17 MED ORDER — HYDROCHLOROTHIAZIDE 25 MG PO TABS
25.0000 mg | ORAL_TABLET | Freq: Once | ORAL | Status: AC
  Administered 2013-10-17: 25 mg via ORAL
  Filled 2013-10-17: qty 1

## 2013-10-17 MED ORDER — LISINOPRIL 10 MG PO TABS
20.0000 mg | ORAL_TABLET | Freq: Once | ORAL | Status: AC
  Administered 2013-10-17: 20 mg via ORAL
  Filled 2013-10-17: qty 2

## 2013-10-17 MED ORDER — OXYCODONE-ACETAMINOPHEN 5-325 MG PO TABS
1.0000 | ORAL_TABLET | Freq: Once | ORAL | Status: AC
  Administered 2013-10-17: 1 via ORAL
  Filled 2013-10-17: qty 1

## 2013-10-17 MED ORDER — TRAMADOL HCL 50 MG PO TABS
50.0000 mg | ORAL_TABLET | Freq: Four times a day (QID) | ORAL | Status: DC | PRN
Start: 2013-10-17 — End: 2013-10-19

## 2013-10-17 NOTE — ED Provider Notes (Signed)
CSN: [id number]     Arrival date & time 10/17/13  [id number] History  This chart was scribed for Benny LennertJoseph L Zammit, MD by Shari HeritageAisha Amuda, ED Scribe. The patient was seen in room APA03/APA03. Patient's care was started at 9:40 AM.  Chief Complaint  Patient presents with  . Headache    Patient is a 33 y.o. female presenting with headaches. The history is provided by the patient. No language interpreter was used.  Headache Pain location:  Occipital Quality:  Sharp Radiates to:  Does not radiate Onset quality:  Gradual Duration:  3 days Timing:  Constant Progression:  Worsening Chronicity:  New Similar to prior headaches: no   Associated symptoms: no abdominal pain, no back pain, no congestion, no cough, no diarrhea, no fatigue, no seizures and no sinus pressure     HPI Comments: Criss Rosalesamika L Stimpson is a 33 y.o. female who presents to the Emergency Department complaining of a gradually worsening, constant, severe occipital headache that began 3 days ago. Patient has a history of hypertension and has not taken any antihypertensives for the past 3 days because she ran out. She states that she has refills for her medications, but she has not gone to pick them up. She denies prior history of similar headaches. She denies abdominal pain, back pain, congestion, cough, diarrhea, fatigue, seizures, sinus pressure, or any other symptoms at this time. Patient also has a medical history of seizures.   Past Medical History  Diagnosis Date  . Hypertension   . Seizures    Past Surgical History  Procedure Laterality Date  . Cholecystectomy    . Breast surgery    . Tubal ligation    . Cesarean section     No family history on file. History  Substance Use Topics  . Smoking status: Never Smoker   . Smokeless tobacco: Not on file  . Alcohol Use: No   OB History   Grav Para Term Preterm Abortions TAB SAB Ect Mult Living                 Review of Systems  Constitutional: Negative for appetite change and  fatigue.  HENT: Negative for congestion, ear discharge and sinus pressure.   Eyes: Negative for discharge.  Respiratory: Negative for cough.   Cardiovascular: Negative for chest pain.  Gastrointestinal: Negative for abdominal pain and diarrhea.  Genitourinary: Negative for frequency and hematuria.  Musculoskeletal: Negative for back pain.  Skin: Negative for rash.  Neurological: Positive for headaches. Negative for seizures.  Psychiatric/Behavioral: Negative for hallucinations.      Allergies  Chocolate  Home Medications   Prior to Admission medications   Medication Sig Start Date End Date Taking? Authorizing Provider  lisinopril-hydrochlorothiazide (PRINZIDE,ZESTORETIC) 20-25 MG per tablet Take 1 tablet by mouth daily.      Historical Provider, MD  promethazine (PHENERGAN) 25 MG suppository Place 1 suppository (25 mg total) rectally every 6 (six) hours as needed for nausea. 04/19/12   Annamarie Dawleyerry S Strand, MD  promethazine (PHENERGAN) 25 MG tablet Take 0.5 tablets (12.5 mg total) by mouth every 6 (six) hours as needed for nausea. 04/24/11 05/01/11  Annamarie Dawleyerry S Strand, MD  promethazine (PHENERGAN) 25 MG tablet Take 1 tablet (25 mg total) by mouth every 6 (six) hours as needed for nausea. 07/22/11 07/29/11  Geoffery Lyonsouglas Delo, MD  promethazine (PHENERGAN) 25 MG tablet Take 0.5 tablets (12.5 mg total) by mouth every 6 (six) hours as needed for nausea. 04/19/12 04/26/12  Annamarie Dawleyerry S Strand, MD  topiramate (TOPAMAX) 25 MG tablet Take 25 mg by mouth at bedtime.    Historical Provider, MD   Triage Vitals: BP 154/107  Pulse 124  Temp(Src) 98.9 F (37.2 C) (Oral)  Resp 16  Ht 5\' 2"  (1.575 m)  Wt 245 lb (111.131 kg)  BMI 44.80 kg/m2  SpO2 97%  LMP 10/16/2013 Physical Exam  Constitutional: She is oriented to person, place, and time. She appears well-developed.  HENT:  Head: Normocephalic.  Eyes: Conjunctivae and EOM are normal. No scleral icterus.  Neck: Neck supple. No thyromegaly present.  Cardiovascular:  Normal rate and regular rhythm.  Exam reveals no gallop and no friction rub.   No murmur heard. Pulmonary/Chest: No stridor. She has no wheezes. She has no rales. She exhibits no tenderness.  Abdominal: She exhibits no distension. There is no tenderness. There is no rebound.  Musculoskeletal: Normal range of motion. She exhibits no edema.  Lymphadenopathy:    She has no cervical adenopathy.  Neurological: She is oriented to person, place, and time. She exhibits normal muscle tone. Coordination normal.  Skin: No rash noted. No erythema.  Psychiatric: She has a normal mood and affect. Her behavior is normal.    ED Course  Procedures (including critical care time) DIAGNOSTIC STUDIES: Oxygen Saturation is 97% on room air, adequate by my interpretation.    COORDINATION OF CARE: 9:43 AM- Patient informed of current plan for treatment and evaluation and agrees with plan at this time.     Labs Review Labs Reviewed - No data to display  Imaging Review No results found.   EKG Interpretation None      MDM   Final diagnoses:  None   Htn, headache.  Pt  To start back on her meds   The chart was scribed for me under my direct supervision.  I personally performed the history, physical, and medical decision making and all procedures in the evaluation of this patient.Benny Lennert.   Joseph L Zammit, MD 10/17/13 1245

## 2013-10-17 NOTE — ED Notes (Signed)
Pt c/o of intermittent sharp pain in back of head, states this has been going on for four days, but has gotten progressively worse and has became more constant. Hx of HTN ran out of meds 4 days ago.

## 2013-10-17 NOTE — Discharge Instructions (Signed)
Get your bp med and follow up in 1-2 weeks with your pcp

## 2013-10-17 NOTE — ED Notes (Signed)
NAD noted at time of d/c instruction.  

## 2013-10-19 ENCOUNTER — Emergency Department (HOSPITAL_COMMUNITY)
Admission: EM | Admit: 2013-10-19 | Discharge: 2013-10-19 | Disposition: A | Discharge status: Home / Self Care | Payer: Medicaid Other | Attending: Emergency Medicine | Admitting: Emergency Medicine

## 2013-10-19 ENCOUNTER — Encounter (HOSPITAL_COMMUNITY): Payer: Self-pay | Admitting: Emergency Medicine

## 2013-10-19 DIAGNOSIS — Z79899 Other long term (current) drug therapy: Secondary | ICD-10-CM | POA: Insufficient documentation

## 2013-10-19 DIAGNOSIS — I10 Essential (primary) hypertension: Secondary | ICD-10-CM | POA: Insufficient documentation

## 2013-10-19 DIAGNOSIS — J029 Acute pharyngitis, unspecified: Secondary | ICD-10-CM | POA: Insufficient documentation

## 2013-10-19 DIAGNOSIS — G40909 Epilepsy, unspecified, not intractable, without status epilepticus: Secondary | ICD-10-CM | POA: Insufficient documentation

## 2013-10-19 LAB — RAPID STREP SCREEN (MED CTR MEBANE ONLY): Streptococcus, Group A Screen (Direct): NEGATIVE

## 2013-10-19 MED ORDER — KETOROLAC TROMETHAMINE 60 MG/2ML IM SOLN
30.0000 mg | Freq: Once | INTRAMUSCULAR | Status: AC
  Administered 2013-10-19: 30 mg via INTRAMUSCULAR
  Filled 2013-10-19: qty 2

## 2013-10-19 MED ORDER — AMOXICILLIN-POT CLAVULANATE 875-125 MG PO TABS: 1.0000 | ORAL_TABLET | Freq: Two times a day (BID) | ORAL | Status: AC

## 2013-10-19 MED ORDER — LIDOCAINE VISCOUS 2 % MT SOLN: 15.0000 mL | Freq: Four times a day (QID) | OROMUCOSAL | Status: DC | PRN

## 2013-10-19 MED ORDER — AMOXICILLIN-POT CLAVULANATE 875-125 MG PO TABS
1.0000 | ORAL_TABLET | Freq: Once | ORAL | Status: AC
  Administered 2013-10-19: 1 via ORAL
  Filled 2013-10-19: qty 1

## 2013-10-19 MED ORDER — LIDOCAINE VISCOUS 2 % MT SOLN
15.0000 mL | Freq: Once | OROMUCOSAL | Status: AC
  Administered 2013-10-19: 15 mL via OROMUCOSAL
  Filled 2013-10-19: qty 15

## 2013-10-19 MED ORDER — DEXAMETHASONE SODIUM PHOSPHATE 10 MG/ML IJ SOLN
10.0000 mg | Freq: Once | INTRAMUSCULAR | Status: AC
  Administered 2013-10-19: 10 mg via INTRAMUSCULAR
  Filled 2013-10-19: qty 1

## 2013-10-19 NOTE — Discharge Instructions (Signed)
As discussed, you have pharyngitis, or information of the back of your throat.  This is likely due to infection. Please be sure to follow up with your physician this week for appropriate ongoing management.  Return here for concerning changes in your condition.

## 2013-10-19 NOTE — ED Provider Notes (Signed)
CSN: [id number]     Arrival date & time 10/19/13  1323 History  This chart was scribed for Gerhard Munchobert Irine Heminger, MD by Ardelia Memsylan Malpass, ED Scribe. This patient was seen in room [facility]19[facility] and the patient's care was started at 2:13 PM.    Chief Complaint  Patient presents with  . Sore Throat    The history is provided by the patient. No language interpreter was used.    HPI Comments: Denise Walters is a 33 y.o. female who presents to the Emergency Department complaining of a sore throat over the past 2 days. She states that she has constant, moderate pain in her throat that is worsened with swallowing. She states that she has had associated lightheadedness and dizziness with standing and episodes of emesis immediately after eating. She states that she has tried Hall's cough drops without relief. She denies any sick contacts. She denies dyspnea, fever, syncope, confusion or any other symptoms. She states that there is no chance she could be pregnant. She states that she has no medication allergies.   Past Medical History  Diagnosis Date  . Hypertension   . Seizures    Past Surgical History  Procedure Laterality Date  . Cholecystectomy    . Breast surgery    . Tubal ligation    . Cesarean section     No family history on file. History  Substance Use Topics  . Smoking status: Never Smoker   . Smokeless tobacco: Not on file  . Alcohol Use: No   OB History   Grav Para Term Preterm Abortions TAB SAB Ect Mult Living                 Review of Systems  Constitutional:       Per HPI, otherwise negative  HENT:       Per HPI, otherwise negative  Respiratory:       Per HPI, otherwise negative  Cardiovascular:       Per HPI, otherwise negative  Gastrointestinal: Negative for vomiting.  Endocrine:       Negative aside from HPI  Genitourinary:       Neg aside from HPI   Musculoskeletal:       Per HPI, otherwise negative  Skin: Negative.   Neurological: Negative for syncope.     Allergies  Chocolate  Home Medications   Prior to Admission medications   Medication Sig Start Date End Date Taking? Authorizing Provider  ARIPiprazole (ABILIFY) 5 MG tablet Take 5 mg by mouth daily.    Historical Provider, MD  citalopram (CELEXA) 40 MG tablet Take 40 mg by mouth daily.    Historical Provider, MD  clonazePAM (KLONOPIN) 1 MG tablet Take 1 mg by mouth at bedtime.    Historical Provider, MD  lisinopril-hydrochlorothiazide (PRINZIDE,ZESTORETIC) 20-25 MG per tablet Take 1 tablet by mouth daily.      Historical Provider, MD  promethazine (PHENERGAN) 25 MG tablet Take 0.5 tablets (12.5 mg total) by mouth every 6 (six) hours as needed for nausea. 04/24/11 05/01/11  Annamarie Dawleyerry S Strand, MD  promethazine (PHENERGAN) 25 MG tablet Take 1 tablet (25 mg total) by mouth every 6 (six) hours as needed for nausea. 07/22/11 07/29/11  Geoffery Lyonsouglas Delo, MD  promethazine (PHENERGAN) 25 MG tablet Take 0.5 tablets (12.5 mg total) by mouth every 6 (six) hours as needed for nausea. 04/19/12 04/26/12  Annamarie Dawleyerry S Strand, MD  traMADol (ULTRAM) 50 MG tablet Take 1 tablet (50 mg total) by mouth every 6 (six)  hours as needed. 10/17/13   Benny LennertJoseph L Zammit, MD   Triage Vitals: BP 157/110  Pulse 125  Temp(Src) 99 F (37.2 C) (Oral)  Resp 20  Ht 5\' 2"  (1.575 m)  Wt 257 lb (116.574 kg)  BMI 46.99 kg/m2  SpO2 99%  LMP 10/16/2013  Physical Exam  Nursing note and vitals reviewed. Constitutional: She is oriented to person, place, and time. She appears well-developed and well-nourished. No distress.  HENT:  Head: Normocephalic and atraumatic.  Exudative pharyngitis present.  Eyes: Conjunctivae and EOM are normal.  Neck: Neck supple.  Cardiovascular: Normal rate, regular rhythm and normal heart sounds.   Pulmonary/Chest: Effort normal and breath sounds normal. No stridor. No respiratory distress. She has no wheezes. She has no rales.  Abdominal: She exhibits no distension.  Musculoskeletal: She exhibits no edema.   Neurological: She is alert and oriented to person, place, and time. No cranial nerve deficit.  Skin: Skin is warm and dry.  Psychiatric: She has a normal mood and affect.    ED Course  Procedures (including critical care time)  DIAGNOSTIC STUDIES: Oxygen Saturation is 99% on RA, normal by my interpretation.    COORDINATION OF CARE: 2:16 PM- Pt advised of plan for treatment and pt agrees.  On repeat exam the patient is in no distress.  She states that she feels better following meds. No resp distress, airway remains patent.   MDM   Final diagnoses:  Pharyngitis   This patient presents with several days of sore throat. Though the patient's initial strep test is negative, given the exudative pharyngitis, her tachycardia, discomfort, she was treated empirically, pending additional lab results. Absent respiratory distress, and with a patent airway, no evidence of distress, she was discharged in stable condition to follow up with primary care per  I personally performed the services described in this documentation, which was scribed in my presence. The recorded information has been reviewed and is accurate.     Gerhard Munchobert Adean Milosevic, MD 10/19/13 1623

## 2013-10-19 NOTE — ED Notes (Signed)
Dr Lockwood at bedside,  

## 2013-10-19 NOTE — ED Notes (Signed)
PT c/o sore throat/cough/runny nose x2 days.

## 2013-10-21 ENCOUNTER — Encounter (HOSPITAL_COMMUNITY): Payer: Self-pay | Admitting: Emergency Medicine

## 2013-10-21 ENCOUNTER — Emergency Department (HOSPITAL_COMMUNITY): Payer: Medicaid Other

## 2013-10-21 ENCOUNTER — Emergency Department (HOSPITAL_COMMUNITY)
Admission: EM | Admit: 2013-10-21 | Discharge: 2013-10-21 | Disposition: A | Discharge status: Home / Self Care | Payer: Medicaid Other | Attending: Emergency Medicine | Admitting: Emergency Medicine

## 2013-10-21 DIAGNOSIS — F411 Generalized anxiety disorder: Secondary | ICD-10-CM | POA: Insufficient documentation

## 2013-10-21 DIAGNOSIS — Z79899 Other long term (current) drug therapy: Secondary | ICD-10-CM | POA: Insufficient documentation

## 2013-10-21 DIAGNOSIS — I10 Essential (primary) hypertension: Secondary | ICD-10-CM | POA: Insufficient documentation

## 2013-10-21 DIAGNOSIS — Z792 Long term (current) use of antibiotics: Secondary | ICD-10-CM | POA: Insufficient documentation

## 2013-10-21 DIAGNOSIS — G40909 Epilepsy, unspecified, not intractable, without status epilepticus: Secondary | ICD-10-CM | POA: Insufficient documentation

## 2013-10-21 DIAGNOSIS — J039 Acute tonsillitis, unspecified: Secondary | ICD-10-CM | POA: Insufficient documentation

## 2013-10-21 DIAGNOSIS — R112 Nausea with vomiting, unspecified: Secondary | ICD-10-CM | POA: Insufficient documentation

## 2013-10-21 DIAGNOSIS — R000 Tachycardia, unspecified: Secondary | ICD-10-CM | POA: Insufficient documentation

## 2013-10-21 DIAGNOSIS — R197 Diarrhea, unspecified: Secondary | ICD-10-CM | POA: Insufficient documentation

## 2013-10-21 DIAGNOSIS — J36 Peritonsillar abscess: Secondary | ICD-10-CM | POA: Insufficient documentation

## 2013-10-21 LAB — BASIC METABOLIC PANEL
BUN: 9 mg/dL (ref 6–23)
CHLORIDE: 101 meq/L (ref 96–112)
CO2: 28 meq/L (ref 19–32)
Calcium: 8.4 mg/dL (ref 8.4–10.5)
Creatinine, Ser: 0.85 mg/dL (ref 0.50–1.10)
GFR calc non Af Amer: 90 mL/min — ABNORMAL LOW (ref >90)
Glucose, Bld: 99 mg/dL (ref 70–99)
POTASSIUM: 3.7 meq/L (ref 3.7–5.3)
Sodium: 141 mEq/L (ref 137–147)

## 2013-10-21 LAB — CBC WITH DIFFERENTIAL/PLATELET
Basophils Absolute: 0.1 10*3/uL (ref 0.0–0.1)
Basophils Relative: 1 % (ref 0–1)
Eosinophils Absolute: 0 10*3/uL (ref 0.0–0.7)
Eosinophils Relative: 0 % (ref 0–5)
HEMATOCRIT: 33 % — AB (ref 36.0–46.0)
HEMOGLOBIN: 10.3 g/dL — AB (ref 12.0–15.0)
LYMPHS PCT: 13 % (ref 12–46)
Lymphs Abs: 1.7 10*3/uL (ref 0.7–4.0)
MCH: 27.8 pg (ref 26.0–34.0)
MCHC: 31.2 g/dL (ref 30.0–36.0)
MCV: 88.9 fL (ref 78.0–100.0)
MONO ABS: 1.2 10*3/uL — AB (ref 0.1–1.0)
MONOS PCT: 9 % (ref 3–12)
NEUTROS ABS: 10.6 10*3/uL — AB (ref 1.7–7.7)
Neutrophils Relative %: 78 % — ABNORMAL HIGH (ref 43–77)
Platelets: 320 10*3/uL (ref 150–400)
RBC: 3.71 MIL/uL — ABNORMAL LOW (ref 3.87–5.11)
RDW: 15.3 % (ref 11.5–15.5)
WBC: 13.6 10*3/uL — AB (ref 4.0–10.5)

## 2013-10-21 LAB — CULTURE, GROUP A STREP

## 2013-10-21 MED ORDER — IOHEXOL 300 MG/ML  SOLN
80.0000 mL | Freq: Once | INTRAMUSCULAR | Status: AC | PRN
  Administered 2013-10-21: 80 mL via INTRAVENOUS

## 2013-10-21 MED ORDER — DEXAMETHASONE SODIUM PHOSPHATE 4 MG/ML IJ SOLN
8.0000 mg | Freq: Once | INTRAMUSCULAR | Status: AC
Start: 2013-10-21 — End: 2013-10-21
  Administered 2013-10-21: 8 mg via INTRAVENOUS
  Filled 2013-10-21: qty 2

## 2013-10-21 MED ORDER — PROCHLORPERAZINE EDISYLATE 5 MG/ML IJ SOLN
10.0000 mg | Freq: Once | INTRAMUSCULAR | Status: AC
  Administered 2013-10-21: 10 mg via INTRAVENOUS
  Filled 2013-10-21: qty 2

## 2013-10-21 MED ORDER — DEXAMETHASONE SODIUM PHOSPHATE 4 MG/ML IJ SOLN
10.0000 mg | Freq: Once | INTRAMUSCULAR | Status: AC
  Administered 2013-10-21: 10 mg via INTRAMUSCULAR
  Filled 2013-10-21: qty 3

## 2013-10-21 MED ORDER — IOHEXOL 300 MG/ML  SOLN: 80.0000 mL | Freq: Once | INTRAMUSCULAR | Status: DC | PRN

## 2013-10-21 MED ORDER — CLINDAMYCIN HCL 300 MG PO CAPS: 300.0000 mg | ORAL_CAPSULE | Freq: Three times a day (TID) | ORAL | Status: DC

## 2013-10-21 MED ORDER — FENTANYL CITRATE 0.05 MG/ML IJ SOLN
50.0000 ug | Freq: Once | INTRAMUSCULAR | Status: AC
  Administered 2013-10-21: 50 ug via INTRAVENOUS
  Filled 2013-10-21: qty 2

## 2013-10-21 MED ORDER — HYDROCODONE-ACETAMINOPHEN 7.5-325 MG PO TABS: 1.0000 | ORAL_TABLET | ORAL | Status: DC | PRN

## 2013-10-21 MED ORDER — ONDANSETRON HCL 4 MG/2ML IJ SOLN
4.0000 mg | Freq: Once | INTRAMUSCULAR | Status: DC
  Filled 2013-10-21: qty 2

## 2013-10-21 MED ORDER — ONDANSETRON HCL 4 MG/2ML IJ SOLN
4.0000 mg | Freq: Once | INTRAMUSCULAR | Status: AC
  Administered 2013-10-21: 4 mg via INTRAVENOUS

## 2013-10-21 MED ORDER — DEXAMETHASONE 6 MG PO TABS: ORAL_TABLET | ORAL | Status: DC

## 2013-10-21 MED ORDER — CLINDAMYCIN PHOSPHATE 900 MG/50ML IV SOLN
900.0000 mg | Freq: Once | INTRAVENOUS | Status: AC
  Administered 2013-10-21: 900 mg via INTRAVENOUS
  Filled 2013-10-21: qty 50

## 2013-10-21 NOTE — Discharge Instructions (Signed)
Please use salt water gargles 3 times a day. Please use Decadron and Cleocin daily until all taken. Please use Norco every 4 hours if needed for pain. Norco may cause drowsiness, please use with caution. Please wash hands frequently. Please do not allow anyone to share your eating and drinking utensils. Please see the ear nose and throat specialist listed above if not improving in the next 3-5 days. Tonsillitis Tonsillitis is an infection of the throat. This infection causes the tonsils to become red, tender, and puffy (swollen). Tonsils are groups of tissue at the back of your throat. If bacteria caused your infection, antibiotic medicine will be given to you. Sometimes symptoms of tonsillitis can be relieved with the use of steroid medicine. If your tonsillitis is severe and happens often, you may need to get your tonsils removed (tonsillectomy). HOME CARE   Rest and sleep often.  Drink enough fluids to keep your pee (urine) clear or pale yellow.  While your throat is sore, eat soft or liquid foods like:  Soup.  Ice cream.  Instant breakfast drinks.  Eat frozen ice pops.  Gargle with a warm or cold liquid to help soothe the throat. Gargle with a water and salt mix. Mix 1/4 teaspoon of salt and 1/4 teaspoon of baking soda in 1 cup of water.  Only take medicines as told by your doctor.  If you are given medicines (antibiotics), take them as told. Finish them even if you start to feel better. GET HELP IF:  You have large, tender lumps in your neck.  You have a rash.  You cough up green, yellow-brown, or bloody fluid.  You cannot swallow liquids or food for 24 hours.  You notice that only one of your tonsils is swollen. GET HELP RIGHT AWAY IF:   You throw up (vomit).  You have a very bad headache.  You have a stiff neck.  You have chest pain.  You have trouble breathing or swallowing.  You have bad throat pain, drooling, or your voice changes.  You have bad pain not  helped by medicine.  You cannot fully open your mouth.  You have redness, puffiness, or bad pain in the neck.  You have a fever. MAKE SURE YOU:   Understand these instructions.  Will watch your condition.  Will get help right away if you are not doing well or get worse. Document Released: 09/27/2007 Document Revised: 04/15/2013 Document Reviewed: 09/27/2012 [facility] Patient Information 2015 [place], [place]. This information is not intended to replace advice given to you by your health care provider. Make sure you discuss any questions you have with your health care provider.

## 2013-10-21 NOTE — ED Provider Notes (Signed)
CSN: [id number]     Arrival date & time 10/21/13  0865 History   First MD Initiated Contact with Patient 10/21/13 [phone]     Chief Complaint  Patient presents with  . Emesis     (Consider location/radiation/quality/duration/timing/severity/associated sxs/prior Treatment) HPI Comments: Patient is a 33 year old female who presents to the emergency department with complaint of sore throat, nausea, vomiting, and diarrhea. The patient states that she started having problems on June 26, which time she was having problems with headaches. She was seen again on Sunday, June 28 at which time she was having problems with sore throat. She was noted according to her previous ED records to have tachycardia and seemed to be quite uncomfortable. She was treated with intramuscular antibiotics and medication for discomfort. The patient states that she never completely got better. On last night she started having vomiting and diarrhea that continues on up into the morning. She states that she is drooling more and very uncomfortable. She is not familiar with any fever during this time, she's not had any unusual rash, she's not had any previous operations or procedures involving the posterior pharynx her throat area. The patient denies unusual shortness of breath or chest pain.  Patient is a 33 y.o. female presenting with vomiting. The history is provided by the patient.  Emesis Associated symptoms: chills, diarrhea and sore throat   Associated symptoms: no abdominal pain and no arthralgias     Past Medical History  Diagnosis Date  . Hypertension   . Seizures    Past Surgical History  Procedure Laterality Date  . Cholecystectomy    . Breast surgery    . Tubal ligation    . Cesarean section     History reviewed. No pertinent family history. History  Substance Use Topics  . Smoking status: Never Smoker   . Smokeless tobacco: Not on file  . Alcohol Use: No   OB History   Grav Para Term Preterm Abortions  TAB SAB Ect Mult Living                 Review of Systems  Constitutional: Positive for chills and appetite change. Negative for activity change.       All ROS Neg except as noted in HPI  HENT: Positive for congestion, drooling, rhinorrhea, sore throat and trouble swallowing. Negative for nosebleeds.   Eyes: Negative for photophobia and discharge.  Respiratory: Negative for cough, shortness of breath and wheezing.   Cardiovascular: Negative for chest pain, palpitations and leg swelling.  Gastrointestinal: Positive for nausea, vomiting and diarrhea. Negative for abdominal pain and blood in stool.  Endocrine: Negative.   Genitourinary: Negative for dysuria, frequency and hematuria.  Musculoskeletal: Negative for arthralgias, back pain and neck pain.  Skin: Negative.   Neurological: Positive for seizures. Negative for dizziness and speech difficulty.  Psychiatric/Behavioral: Negative for hallucinations and confusion.      Allergies  Chocolate  Home Medications   Prior to Admission medications   Medication Sig Start Date End Date Taking? Authorizing Provider  amoxicillin-clavulanate (AUGMENTIN) 875-125 MG per tablet Take 1 tablet by mouth every 12 (twelve) hours. 10/19/13 10/26/13  Gerhard Munch, MD  ARIPiprazole (ABILIFY) 5 MG tablet Take 5 mg by mouth daily.    Historical Provider, MD  citalopram (CELEXA) 40 MG tablet Take 40 mg by mouth daily.    Historical Provider, MD  clonazePAM (KLONOPIN) 1 MG tablet Take 1 mg by mouth at bedtime.    Historical Provider, MD  lidocaine (XYLOCAINE)  2 % solution Use as directed 15 mLs in the mouth or throat every 6 (six) hours as needed for mouth pain. 10/19/13   Gerhard Munchobert Lockwood, MD  lisinopril-hydrochlorothiazide (PRINZIDE,ZESTORETIC) 20-25 MG per tablet Take 1 tablet by mouth daily.      Historical Provider, MD  traMADol (ULTRAM) 50 MG tablet Take 50 mg by mouth daily as needed for moderate pain.    Historical Provider, MD   BP 153/121  Pulse  122  Temp(Src) 99.6 F (37.6 C) (Oral)  Ht 5\' 2"  (1.575 m)  Wt 257 lb (116.574 kg)  BMI 46.99 kg/m2  SpO2 100%  LMP 10/18/2013 Physical Exam  Nursing note and vitals reviewed. Constitutional: She is oriented to person, place, and time. She appears well-developed and well-nourished.  Non-toxic appearance.  HENT:  Head: Normocephalic.  Right Ear: Tympanic membrane and external ear normal.  Left Ear: Tympanic membrane and external ear normal.  The airway is patent. There is some swelling however under the tongue near the frenulum.  There is exudate of both tonsils. There is increase redness present. . The speech is clear, but the patient has an almost constant drooling.  Eyes: EOM and lids are normal. Pupils are equal, round, and reactive to light.  Neck: Normal range of motion. Neck supple. Carotid bruit is not present. No tracheal deviation present.  There is swelling and tenderness of the submental area. This extends to the base of the anterior neck. I cannot demonstrate palpable lymphadenopathy, but the patient is sore to palpation.  Cardiovascular: Regular rhythm, normal heart sounds, intact distal pulses and normal pulses.  Tachycardia present.   Pulmonary/Chest: Breath sounds normal. No stridor. No respiratory distress.  Abdominal: Soft. Bowel sounds are normal. There is no tenderness. There is no guarding.  Musculoskeletal: Normal range of motion.  Lymphadenopathy:       Head (right side): No submandibular adenopathy present.       Head (left side): No submandibular adenopathy present.    She has cervical adenopathy.  Neurological: She is alert and oriented to person, place, and time. She has normal strength. No cranial nerve deficit or sensory deficit.  Skin: Skin is warm and dry.  Psychiatric: Her speech is normal. Her mood appears anxious.  Patient is tearful during examination.    ED Course  Procedures (including critical care time) Labs Review Labs Reviewed - No data  to display  Imaging Review No results found.   EKG Interpretation None      MDM Temperature 99.6. Pulse rate 122 elevated. Blood pressure 153/122 elevated. Pulse oximetry 100% on room air. Within normal limits by my interpretation.  The findings on examination were shared with the patient. IV started, and IV pain medications given. Patient states she had only minimal improvement from the pain medications.  Basic metabolic panel well within normal limits. Complete blood count shows an elevated white blood cell count of 13,600. CT soft tissue neck with contrast reveals the tonsils to be markedly enlarged bilaterally. There is a 7 mm collection of fluid in the left anterior tonsil which was believed to be related to an abscess. There multiple sites of cervical adenopathy, extending to the chest area.  Case discussed with Dr Christain Sacramentoeo. He suggest additional dose of decadron. He is in agreement with clindamycin 900mg . She is diagnosed with severe tonsilitis with abscess. Pt tolerated iv medications without problem. Vital signs improved. Feel that it is safe for patient to be d/c home. Pt to return if any changes or problem.  Rx for clindamycin, decadron,  And norco given to the patient.   Final diagnoses:  None    **I have reviewed nursing notes, vital signs, and all appropriate lab and imaging results for this patient.Kathie Dike*    Hobson M Bryant, PA-C 10/21/13 1419

## 2013-10-21 NOTE — ED Notes (Signed)
Sore throat, Vomiting and diarrhea "all night and morning", yellow with blood tinged emesis. Seen Sunday for same issue, with no resolution/improvement.

## 2013-10-21 NOTE — ED Provider Notes (Signed)
Medical screening examination/treatment/procedure(s) were performed by non-physician practitioner and as supervising physician I was immediately available for consultation/collaboration.   EKG Interpretation None        Donald W Wickline, MD 10/21/13 1521 

## 2013-10-26 ENCOUNTER — Emergency Department (HOSPITAL_COMMUNITY)
Admission: EM | Admit: 2013-10-26 | Discharge: 2013-10-26 | Disposition: A | Discharge status: Home / Self Care | Payer: Medicaid Other | Attending: Emergency Medicine | Admitting: Emergency Medicine

## 2013-10-26 ENCOUNTER — Encounter (HOSPITAL_COMMUNITY): Payer: Self-pay | Admitting: Emergency Medicine

## 2013-10-26 DIAGNOSIS — R197 Diarrhea, unspecified: Secondary | ICD-10-CM | POA: Insufficient documentation

## 2013-10-26 DIAGNOSIS — R079 Chest pain, unspecified: Secondary | ICD-10-CM | POA: Insufficient documentation

## 2013-10-26 DIAGNOSIS — R112 Nausea with vomiting, unspecified: Secondary | ICD-10-CM | POA: Insufficient documentation

## 2013-10-26 DIAGNOSIS — J36 Peritonsillar abscess: Secondary | ICD-10-CM

## 2013-10-26 DIAGNOSIS — R51 Headache: Secondary | ICD-10-CM | POA: Diagnosis not present

## 2013-10-26 DIAGNOSIS — E876 Hypokalemia: Secondary | ICD-10-CM | POA: Insufficient documentation

## 2013-10-26 DIAGNOSIS — R5381 Other malaise: Secondary | ICD-10-CM | POA: Diagnosis present

## 2013-10-26 DIAGNOSIS — R5383 Other fatigue: Secondary | ICD-10-CM | POA: Diagnosis present

## 2013-10-26 LAB — CBC WITH DIFFERENTIAL/PLATELET
Basophils Absolute: 0.1 K/uL (ref 0.0–0.1)
Basophils Relative: 1 % (ref 0–1)
Eosinophils Absolute: 0.1 K/uL (ref 0.0–0.7)
Eosinophils Relative: 1 % (ref 0–5)
HCT: 32.6 % — ABNORMAL LOW (ref 36.0–46.0)
Hemoglobin: 10.5 g/dL — ABNORMAL LOW (ref 12.0–15.0)
Lymphocytes Relative: 16 % (ref 12–46)
Lymphs Abs: 2.4 K/uL (ref 0.7–4.0)
MCH: 27.9 pg (ref 26.0–34.0)
MCHC: 32.2 g/dL (ref 30.0–36.0)
MCV: 86.7 fL (ref 78.0–100.0)
Monocytes Absolute: 1.6 K/uL — ABNORMAL HIGH (ref 0.1–1.0)
Monocytes Relative: 11 % (ref 3–12)
Neutro Abs: 10.5 K/uL — ABNORMAL HIGH (ref 1.7–7.7)
Neutrophils Relative %: 71 % (ref 43–77)
Platelets: 436 K/uL — ABNORMAL HIGH (ref 150–400)
RBC: 3.76 MIL/uL — ABNORMAL LOW (ref 3.87–5.11)
RDW: 15.3 % (ref 11.5–15.5)
WBC: 14.7 K/uL — ABNORMAL HIGH (ref 4.0–10.5)

## 2013-10-26 LAB — BASIC METABOLIC PANEL WITH GFR
Anion gap: 13 (ref 5–15)
BUN: 15 mg/dL (ref 6–23)
CO2: 27 meq/L (ref 19–32)
Calcium: 8.3 mg/dL — ABNORMAL LOW (ref 8.4–10.5)
Chloride: 99 meq/L (ref 96–112)
Creatinine, Ser: 0.77 mg/dL (ref 0.50–1.10)
GFR calc Af Amer: >90 mL/min
GFR calc non Af Amer: >90 mL/min
Glucose, Bld: 101 mg/dL — ABNORMAL HIGH (ref 70–99)
Potassium: 2.6 meq/L — CL (ref 3.7–5.3)
Sodium: 139 meq/L (ref 137–147)

## 2013-10-26 LAB — PREGNANCY, URINE: Preg Test, Ur: NEGATIVE

## 2013-10-26 MED ORDER — DEXAMETHASONE SODIUM PHOSPHATE 10 MG/ML IJ SOLN
20.0000 mg | Freq: Once | INTRAMUSCULAR | Status: AC
  Administered 2013-10-26: 20 mg via INTRAVENOUS
  Filled 2013-10-26: qty 2

## 2013-10-26 MED ORDER — SODIUM CHLORIDE 0.9 % IV SOLN
Freq: Once | INTRAVENOUS | Status: AC
  Administered 2013-10-26: 15:00:00 via INTRAVENOUS

## 2013-10-26 MED ORDER — POTASSIUM CHLORIDE 10 MEQ/100ML IV SOLN
10.0000 meq | INTRAVENOUS | Status: AC
  Administered 2013-10-26 (×2): 10 meq via INTRAVENOUS
  Filled 2013-10-26 (×2): qty 100

## 2013-10-26 MED ORDER — SODIUM CHLORIDE 0.9 % IV BOLUS (SEPSIS): 2000.0000 mL | Freq: Once | INTRAVENOUS | Status: DC

## 2013-10-26 MED ORDER — DEXAMETHASONE SODIUM PHOSPHATE 4 MG/ML IJ SOLN
8.0000 mg | Freq: Once | INTRAMUSCULAR | Status: AC
  Administered 2013-10-26: 8 mg via INTRAVENOUS
  Filled 2013-10-26: qty 2

## 2013-10-26 MED ORDER — ONDANSETRON HCL 4 MG/2ML IJ SOLN
4.0000 mg | Freq: Once | INTRAMUSCULAR | Status: AC
  Administered 2013-10-26: 4 mg via INTRAMUSCULAR
  Filled 2013-10-26: qty 2

## 2013-10-26 MED ORDER — SODIUM CHLORIDE 0.9 % IV SOLN
1.5000 g | Freq: Once | INTRAVENOUS | Status: AC
  Administered 2013-10-26: 1.5 g via INTRAVENOUS
  Filled 2013-10-26: qty 1.5

## 2013-10-26 MED ORDER — HYDROMORPHONE HCL PF 1 MG/ML IJ SOLN
1.0000 mg | Freq: Once | INTRAMUSCULAR | Status: AC
  Administered 2013-10-26: 1 mg via INTRAVENOUS
  Filled 2013-10-26: qty 1

## 2013-10-26 MED ORDER — FAMOTIDINE IN NACL 20-0.9 MG/50ML-% IV SOLN
20.0000 mg | Freq: Once | INTRAVENOUS | Status: AC
  Administered 2013-10-26: 20 mg via INTRAVENOUS
  Filled 2013-10-26: qty 50

## 2013-10-26 MED ORDER — CLINDAMYCIN HCL 300 MG PO CAPS: 300.0000 mg | ORAL_CAPSULE | Freq: Four times a day (QID) | ORAL | Status: DC

## 2013-10-26 MED ORDER — CLINDAMYCIN PHOSPHATE 600 MG/50ML IV SOLN
600.0000 mg | Freq: Once | INTRAVENOUS | Status: AC
  Administered 2013-10-26: 600 mg via INTRAVENOUS
  Filled 2013-10-26: qty 50

## 2013-10-26 MED ORDER — ONDANSETRON 4 MG PO TBDP: 4.0000 mg | ORAL_TABLET | Freq: Once | ORAL | Status: DC

## 2013-10-26 MED ORDER — SODIUM CHLORIDE 0.9 % IV BOLUS (SEPSIS)
1000.0000 mL | Freq: Once | INTRAVENOUS | Status: AC
  Administered 2013-10-26: 1000 mL via INTRAVENOUS

## 2013-10-26 NOTE — Consult Note (Signed)
Reason for Consult: Acute tonsillitis, possible abscess Referring Physician: Debroah Baller, NP  HPI:  Denise Walters is an 33 y.o. female who presents to the [facility] today c/o persistent sore throat.  The pain is bilateral. She was seen at ER 5 days ago, and was treated with decadron and clindamycin.  She reports that she has been throwing up her clindamycin. No previous h/o ENT surgery.  Past Medical History  Diagnosis Date  . Hypertension   . Seizures     Past Surgical History  Procedure Laterality Date  . Cholecystectomy    . Breast surgery    . Tubal ligation    . Cesarean section      No family history on file.  Social History:  reports that she has never smoked. She does not have any smokeless tobacco history on file. She reports that she does not drink alcohol or use illicit drugs.  Allergies:  Allergies  Allergen Reactions  . Chocolate Hives   Home Medications    Prior to Admission medications   Medication  Sig  Start Date  End Date  Taking?  Authorizing Provider          ARIPiprazole (ABILIFY) 5 MG tablet  Take 5 mg by mouth daily.    Yes  Historical Provider, MD   citalopram (CELEXA) 40 MG tablet  Take 40 mg by mouth daily.    Yes  Historical Provider, MD   clindamycin (CLEOCIN) 300 MG capsule  Take 1 capsule (300 mg total) by mouth 3 (three) times daily.  10/21/13   Yes  Lenox Ahr, PA-C   clonazePAM (KLONOPIN) 1 MG tablet  Take 1 mg by mouth at bedtime.    Yes  Historical Provider, MD   dexamethasone (DECADRON) 6 MG tablet  Take 6 mg by mouth 2 (two) times daily with a meal.    Yes  Historical Provider, MD   HYDROcodone-acetaminophen (NORCO) 7.5-325 MG per tablet  Take 1 tablet by mouth every 4 (four) hours as needed for moderate pain.  10/21/13   Yes  Lenox Ahr, PA-C   lidocaine (XYLOCAINE) 2 % solution  Use as directed 15 mLs in the mouth or throat every 6 (six) hours as needed for mouth pain.  10/19/13   Yes  Carmin Muskrat, MD    lisinopril-hydrochlorothiazide (PRINZIDE,ZESTORETIC) 20-25 MG per tablet  Take 1 tablet by mouth daily.    Yes  Historical Provider, MD   traMADol (ULTRAM) 50 MG tablet  Take 50 mg by mouth daily as needed for moderate pain.    Yes  Historical Provider, MD      Results for orders placed during the hospital encounter of 10/26/13 (from the past 48 hour(s))  CBC WITH DIFFERENTIAL     Status: Abnormal   Collection Time    10/26/13 11:02 AM      Result Value Ref Range   WBC 14.7 (*) 4.0 - 10.5 K/uL   RBC 3.76 (*) 3.87 - 5.11 MIL/uL   Hemoglobin 10.5 (*) 12.0 - 15.0 g/dL   HCT 32.6 (*) 36.0 - 46.0 %   MCV 86.7  78.0 - 100.0 fL   MCH 27.9  26.0 - 34.0 pg   MCHC 32.2  30.0 - 36.0 g/dL   RDW 15.3  11.5 - 15.5 %   Platelets 436 (*) 150 - 400 K/uL   Neutrophils Relative % 71  43 - 77 %   Lymphocytes Relative 16  12 - 46 %  Monocytes Relative 11  3 - 12 %   Eosinophils Relative 1  0 - 5 %   Basophils Relative 1  0 - 1 %   Neutro Abs 10.5 (*) 1.7 - 7.7 K/uL   Lymphs Abs 2.4  0.7 - 4.0 K/uL   Monocytes Absolute 1.6 (*) 0.1 - 1.0 K/uL   Eosinophils Absolute 0.1  0.0 - 0.7 K/uL   Basophils Absolute 0.1  0.0 - 0.1 K/uL   RBC Morphology POLYCHROMASIA PRESENT     WBC Morphology ATYPICAL LYMPHOCYTES    BASIC METABOLIC PANEL     Status: Abnormal   Collection Time    10/26/13 11:02 AM      Result Value Ref Range   Sodium 139  137 - 147 mEq/L   Potassium 2.6 (*) 3.7 - 5.3 mEq/L   Comment: CRITICAL RESULT CALLED TO, READ BACK BY AND VERIFIED WITH:     EDWARDS,K AT 1152 BY GODFREY,O ON 10/26/13   Chloride 99  96 - 112 mEq/L   CO2 27  19 - 32 mEq/L   Glucose, Bld 101 (*) 70 - 99 mg/dL   BUN 15  6 - 23 mg/dL   Creatinine, Ser 0.77  0.50 - 1.10 mg/dL   Calcium 8.3 (*) 8.4 - 10.5 mg/dL   GFR calc non Af Amer >90  >90 mL/min   GFR calc Af Amer >90  >90 mL/min   Comment: (NOTE)     The eGFR has been calculated using the CKD EPI equation.     This calculation has not been validated in all clinical  situations.     eGFR's persistently <90 mL/min signify possible Chronic Kidney     Disease.   Anion gap 13  5 - 15  PREGNANCY, URINE     Status: None   Collection Time    10/26/13 11:03 AM      Result Value Ref Range   Preg Test, Ur NEGATIVE  NEGATIVE   Comment:            THE SENSITIVITY OF THIS     METHODOLOGY IS >20 mIU/mL.    No results found.   Blood pressure 157/101, pulse 103, temperature 97.8 F (36.6 C), temperature source Oral, resp. rate 15, last menstrual period 10/18/2013, SpO2 95.00%.  Physical Exam  Nursing note and vitals reviewed.  Constitutional: She is oriented to person, place, and time. She appears well-developed and well-nourished. No distress.  Head: Normocephalic and atraumatic.  Right Ear: Hearing, tympanic membrane, external ear and ear canal normal.  Left Ear: Hearing, tympanic membrane, external ear and ear canal normal.  Mouth/Throat: Uvula is midline. Oropharyngeal exudate ( bilaterally) and posterior oropharyngeal erythema present. Airway patent  Eyes: Conjunctivae and EOM are normal. Pupils are equal, round, and reactive to light.  Neck: Neck supple. No tracheal deviation present.  Musculoskeletal: Normal range of motion.  She has cervical adenopathy.  Neurological: She is alert and oriented to person, place, and time.  Skin: Skin is warm and dry.  Psychiatric: She has a normal mood and affect. Her behavior is normal.   Assessment/Plan: Bilateral acute tonsillitis. No abscess is noted today.  Will treat with IV decadron and IV Unasyn in the ER. D/C home on clindamycin for 7 additional days. Pt may f/u at my office later this week.  Nancylee Gaines,SUI W 10/26/2013, 4:30 PM

## 2013-10-26 NOTE — ED Provider Notes (Signed)
[facility] Dr. Suszanne Connerseoh has been called and notified about the patient being present in the ED. Dr. Suszanne Connerseoh en route.  This provider reviewed patient's chart. [facility] was seen and assessed [facility] a couple of days ago where CT soft tissue of the neck was performed that identified approximately 7 mm fluid collection-where a peritonsillar abscess cannot be ruled out. Patient reported back to any [facility] today secondary to weakness, nausea, difficulty swallowing secondary to pain. Dr. Suszanne Connerseoh was notified and [facility] department for abscess to be drained. While at [facility] today patient received a dose of IV clindamycin, potassium, Dilaudid for pain. Denied chest pain, shortness of breath, difficulty breathing, neck pain, neck stiffness.   Results for orders placed during the hospital encounter of 10/26/13  CBC WITH DIFFERENTIAL      Result Value Ref Range   WBC 14.7 (*) 4.0 - 10.5 K/uL   RBC 3.76 (*) 3.87 - 5.11 MIL/uL   Hemoglobin 10.5 (*) 12.0 - 15.0 g/dL   HCT 13.032.6 (*) 86.536.0 - 78.446.0 %   MCV 86.7  78.0 - 100.0 fL   MCH 27.9  26.0 - 34.0 pg   MCHC 32.2  30.0 - 36.0 g/dL   RDW 69.615.3  29.511.5 - 28.415.5 %   Platelets 436 (*) 150 - 400 K/uL   Neutrophils Relative % 71  43 - 77 %   Lymphocytes Relative 16  12 - 46 %   Monocytes Relative 11  3 - 12 %   Eosinophils Relative 1  0 - 5 %   Basophils Relative 1  0 - 1 %   Neutro Abs 10.5 (*) 1.7 - 7.7 K/uL   Lymphs Abs 2.4  0.7 - 4.0 K/uL   Monocytes Absolute 1.6 (*) 0.1 - 1.0 K/uL   Eosinophils Absolute 0.1  0.0 - 0.7 K/uL   Basophils Absolute 0.1  0.0 - 0.1 K/uL   RBC Morphology POLYCHROMASIA PRESENT     WBC Morphology ATYPICAL LYMPHOCYTES    BASIC METABOLIC PANEL      Result Value Ref Range   Sodium 139  137 - 147 mEq/L   Potassium 2.6 (*) 3.7 - 5.3 mEq/L   Chloride 99  96 - 112 mEq/L   CO2 27  19 - 32 mEq/L   Glucose, Bld 101 (*) 70 - 99 mg/dL   BUN 15  6 - 23 mg/dL   Creatinine, Ser  1.320.77  0.50 - 1.10 mg/dL   Calcium 8.3 (*) 8.4 - 10.5 mg/dL   GFR calc non Af Amer >90  >90 mL/min   GFR calc Af Amer >90  >90 mL/min   Anion gap 13  5 - 15  PREGNANCY, URINE      Result Value Ref Range   Preg Test, Ur NEGATIVE  NEGATIVE   CBC noted mildly elevated white blood cell count of 14.7 with-negative left shift or leukocytosis. BMP noted mildly low potassium of 2.6, hypokalemia identified-patient was given potassium via IV. Urine pregnancy negative.   This provider assessed the patient. Patient sitting upright in bed comfortably. Patient nontoxic appearing. Patient is able to speak in full sentences without difficulty, negative use of accessory muscles, negative stridor. Negative active cooling noted. Heart rate and rhythm normal. Pulses palpable and strong-radial 2+ bilaterally. Cap refill less than 3 seconds. Positive cervical lymphadenopathy identified bilaterally, right more so than left. Negative trismus.   4:22 PM Patient seen and assessed  by Dr. Suszanne Connerseoh. Physician reported that patient does not have an abscess. Recommended 20 mg of Decadron via IV, Unasyn IV. Recommended patient be discharged home with an extension of clindamycin for next 7 days, 300 mg 4 times a day and for patient followup in his office on Wednesday.  Medications  HYDROmorphone (DILAUDID) injection 1 mg (1 mg Intravenous Given 10/26/13 1126)  ondansetron (ZOFRAN) injection 4 mg (4 mg Intramuscular Given 10/26/13 1126)  clindamycin (CLEOCIN) IVPB 600 mg (0 mg Intravenous Stopped 10/26/13 1330)  dexamethasone (DECADRON) injection 8 mg (8 mg Intravenous Given 10/26/13 1148)  famotidine (PEPCID) IVPB 20 mg (0 mg Intravenous Stopped 10/26/13 1239)  potassium chloride 10 mEq in 100 mL IVPB (0 mEq Intravenous Stopped 10/26/13 1539)  0.9 %  sodium chloride infusion ( Intravenous Stopped 10/26/13 1723)  dexamethasone (DECADRON) injection 20 mg (20 mg Intravenous Given 10/26/13 1632)  sodium chloride 0.9 % bolus 1,000 mL (1,000 mLs  Intravenous New Bag/Given 10/26/13 1632)  ampicillin-sulbactam (UNASYN) 1.5 g in sodium chloride 0.9 % 50 mL IVPB (1.5 g Intravenous New Bag/Given 10/26/13 1653)   Filed Vitals:   10/26/13 1606 10/26/13 1610 10/26/13 1630 10/26/13 1658  BP: 175/104 157/101 146/94 140/90  Pulse: 103  92   Temp: 97.8 F (36.6 C)     TempSrc: Oral     Resp: 20 15 17 23   SpO2: 100% 95% 98% 99%   Diagnoses that have been ruled out:  None  Diagnoses that are still under consideration:  None  Final diagnoses:  Hypokalemia  Tonsillar abscess    Patient stable, afebrile. Patient not septic appearing. Negative signs of respiratory distress. Patient seen and assessed by ENT physician reported that the abscess is now resolved-recommended patient to be discharged home. Recommended patient be discharged with clindamycin and to followup in his office on Wednesday. Discussed plan for discharge with patient. Discussed with patient to rest and stay hydrated. Referred to ENT for Wednesday. Discussed with patient to closely monitor symptoms and if symptoms are to worsen or change to report back to the ED - strict return instructions given.  Patient agreed to plan of care, understood, all questions answered.   Raymon MuttonMarissa Trinton Prewitt, PA-C 10/26/13 1741  Raymon MuttonMarissa Kerrianne Jeng, PA-C 10/26/13 1744

## 2013-10-26 NOTE — ED Provider Notes (Signed)
Medical screening examination/treatment/procedure(s) were conducted as a shared visit with non-physician practitioner(s) and myself.  I personally evaluated the patient during the encounter.   EKG Interpretation   Date/Time:  Sunday October 26 2013 10:24:50 EDT Ventricular Rate:  110 PR Interval:  126 QRS Duration: 86 QT Interval:  330 QTC Calculation: 446 R Axis:   36 Text Interpretation:  Sinus tachycardia T wave abnormality, consider  inferior ischemia Compared to previous tracing T wave inversion aVF NOW  PRESENT Confirmed by [facility]  MD, Jonny RuizJOHN ([id number]) on 10/26/2013 10:44:12 AM      No stridor drooling or trismus in the emergency department uvula is midline bilateral tonsillar exudates present with left tonsil larger than the right extending almost to the midline; speech is clear  Denise HornJohn M Katara Griner, MD 10/27/13 1255

## 2013-10-26 NOTE — ED Notes (Signed)
Per [facility]: [facility], sent here for peritonsillar abscess.  Pt has had 20mEq of Potassium. 150/90's. Pt states she hasn't taken BP medication in over a week. Pt AO x4.Marland Kitchen. NAD.

## 2013-10-26 NOTE — ED Notes (Signed)
Per KE gave patient water

## 2013-10-26 NOTE — ED Notes (Signed)
Pt c/o generalized weakness, not being able to eat, n/v since being tx for mouth abscess on 10/21/2013, started having mid center sharp chest pain with sob last night, ekg performed on arrival to er,

## 2013-10-26 NOTE — Discharge Instructions (Signed)
Please call your doctor for a followup appointment within 24-48 hours. When you talk to your doctor please let them know that you were seen in the emergency department and have them acquire all of your records so that they can discuss the findings with you and formulate a treatment plan to fully care for your new and ongoing problems. Please call and set-up an appointment with Dr. Suszanne Connerseoh to be seen and re-assessed  Please rest and stay hydrated  Please avoid any physical or strenuous activity Please take antibiotics as prescribed - please take on a full stomach  Please continue to monitor symptoms closely and if symptoms are to worsen or change (fever greater than 101, chills, sweating, nausea, vomiting, chest pain, shortness of breath, difficulty breathing, numbness, tingling, neck pain, neck stiffness, swelling) please report back to the ED immediately   Tonsillitis Tonsillitis is an infection of the throat that causes the tonsils to become red, tender, and swollen. Tonsils are collections of lymphoid tissue at the back of the throat. Each tonsil has crevices (crypts). Tonsils help fight nose and throat infections and keep infection from spreading to other parts of the body for the first 18 months of life.  CAUSES Sudden (acute) tonsillitis is usually caused by infection with streptococcal bacteria. Long-lasting (chronic) tonsillitis occurs when the crypts of the tonsils become filled with pieces of food and bacteria, which makes it easy for the tonsils to become repeatedly infected. SYMPTOMS  Symptoms of tonsillitis include:  A sore throat, with possible difficulty swallowing.  White patches on the tonsils.  Fever.  Tiredness.  New episodes of snoring during sleep, when you did not snore before.  Small, foul-smelling, yellowish-white pieces of material (tonsilloliths) that you occasionally cough up or spit out. The tonsilloliths can also cause you to have bad breath. DIAGNOSIS Tonsillitis  can be diagnosed through a physical exam. Diagnosis can be confirmed with the results of lab tests, including a throat culture. TREATMENT  The goals of tonsillitis treatment include the reduction of the severity and duration of symptoms and prevention of associated conditions. Symptoms of tonsillitis can be improved with the use of steroids to reduce the swelling. Tonsillitis caused by bacteria can be treated with antibiotic medicines. Usually, treatment with antibiotic medicines is started before the cause of the tonsillitis is known. However, if it is determined that the cause is not bacterial, antibiotic medicines will not treat the tonsillitis. If attacks of tonsillitis are severe and frequent, your health care provider may recommend surgery to remove the tonsils (tonsillectomy). HOME CARE INSTRUCTIONS   Rest as much as possible and get plenty of sleep.  Drink plenty of fluids. While the throat is very sore, eat soft foods or liquids, such as sherbet, soups, or instant breakfast drinks.  Eat frozen ice pops.  Gargle with a warm or cold liquid to help soothe the throat. Mix 1/4 teaspoon of salt and 1/4 teaspoon of baking soda in in 8 oz of water. SEEK MEDICAL CARE IF:   Large, tender lumps develop in your neck.  A rash develops.  A green, yellow-brown, or bloody substance is coughed up.  You are unable to swallow liquids or food for 24 hours.  You notice that only one of the tonsils is swollen. SEEK IMMEDIATE MEDICAL CARE IF:   You develop any new symptoms such as vomiting, severe headache, stiff neck, chest pain, or trouble breathing or swallowing.  You have severe throat pain along with drooling or voice changes.  You have  severe pain, unrelieved with recommended medications.  You are unable to fully open the mouth.  You develop redness, swelling, or severe pain anywhere in the neck.  You have a fever. MAKE SURE YOU:   Understand these instructions.  Will watch your  condition.  Will get help right away if you are not doing well or get worse. Document Released: 01/18/2005 Document Revised: 04/15/2013 Document Reviewed: 09/27/2012 [facility] Patient Information 2015 [place] [place]. This information is not intended to replace advice given to you by your health care provider. Make sure you discuss any questions you have with your health care provider.   Abscess An abscess (boil or furuncle) is an infected area on or under the skin. This area is filled with yellowish-white fluid (pus) and other material (debris). HOME CARE   Only take medicines as told by your doctor.  If you were given antibiotic medicine, take it as directed. Finish the medicine even if you start to feel better.  If gauze is used, follow your doctor's directions for changing the gauze.  To avoid spreading the infection:  Keep your abscess covered with a bandage.  Wash your hands well.  Do not share personal care items, towels, or whirlpools with others.  Avoid skin contact with others.  Keep your skin and clothes clean around the abscess.  Keep all doctor visits as told. GET HELP RIGHT AWAY IF:   You have more pain, puffiness (swelling), or redness in the wound site.  You have more fluid or blood coming from the wound site.  You have muscle aches, chills, or you feel sick.  You have a fever. MAKE SURE YOU:   Understand these instructions.  Will watch your condition.  Will get help right away if you are not doing well or get worse. Document Released: 09/27/2007 Document Revised: 10/10/2011 Document Reviewed: 06/23/2011 [facility] Patient Information 2015 [place], [place]. This information is not intended to replace advice given to you by your health care provider. Make sure you discuss any questions you have with your health care provider.

## 2013-10-26 NOTE — ED Notes (Signed)
CRITICAL VALUE ALERT  Critical value received:  Potassium 2.6  Date of notification:  10/26/13  Time of notification:  1152  Critical value read back:Yes.    Nurse who received alert:  c Kebron Pulse rn  MD notified (1st page):    Time of first page:    MD notified (2nd page):  Time of second page:  Responding MD:  Mayer CamelH neese NP  Time MD responded:  1152

## 2013-10-26 NOTE — ED Provider Notes (Signed)
CSN: [id number]     Arrival date & time 10/26/13  1008 History   First MD Initiated Contact with Patient 10/26/13 1104     Chief Complaint  Patient presents with  . Weakness   Patient is a 33 y.o. female presenting with weakness.  Weakness Associated symptoms include chest pain and headaches.   This chart was scribed for nurse practitioner working with [name], MD, by Andrew Auaven Small, ED Scribe. This patient was seen in room APA08/APA08 and the patient's care was started at 11:10 AM.  Denise Walters is a 33 y.o. female who presents to the Emergency Department complaning of weakness with associated diarrhea, CP and nausea. Pt reports she has not been able to eat due to tonsillar abscess which she was seen for 5 days ago. Pt was prescribed norco, clindamycin and decardron. At that time she was given an IV. Pt has not eaten and 4 days and has only been able to drink water and ginger ail. She states she takes medication with jello but often vomits after taking medication. Pt is able to keep her pain medication down with mild pain. Pt states her throat is still sore and often brings up mucous. Pt states she had negative strep and an ultrasound of her neck 5 days ago. Pt denies back pain and leg swelling.  Past Medical History  Diagnosis Date  . Hypertension   . Seizures    Past Surgical History  Procedure Laterality Date  . Cholecystectomy    . Breast surgery    . Tubal ligation    . Cesarean section     No family history on file. History  Substance Use Topics  . Smoking status: Never Smoker   . Smokeless tobacco: Not on file  . Alcohol Use: No   OB History   Grav Para Term Preterm Abortions TAB SAB Ect Mult Living                 Review of Systems  Constitutional: Positive for chills.  HENT: Positive for sore throat and trouble swallowing.   Respiratory: Positive for chest tightness.   Cardiovascular: Positive for chest pain. Negative for leg swelling.  Gastrointestinal:  Positive for nausea, vomiting and diarrhea.  Genitourinary: Negative for dysuria and urgency.  Musculoskeletal: Positive for myalgias.  Skin: Negative for rash.  Neurological: Positive for weakness (generalized) and headaches.  Psychiatric/Behavioral: Negative for confusion. The patient is not nervous/anxious.    Allergies  Chocolate  Home Medications   Prior to Admission medications   Medication Sig Start Date End Date Taking? Authorizing Provider  amoxicillin-clavulanate (AUGMENTIN) 875-125 MG per tablet Take 1 tablet by mouth every 12 (twelve) hours. 10/19/13 10/26/13 Yes Gerhard Munchobert Lockwood, MD  ARIPiprazole (ABILIFY) 5 MG tablet Take 5 mg by mouth daily.   Yes Historical Provider, MD  citalopram (CELEXA) 40 MG tablet Take 40 mg by mouth daily.   Yes Historical Provider, MD  clindamycin (CLEOCIN) 300 MG capsule Take 1 capsule (300 mg total) by mouth 3 (three) times daily. 10/21/13  Yes Kathie DikeHobson M Bryant, PA-C  clonazePAM (KLONOPIN) 1 MG tablet Take 1 mg by mouth at bedtime.   Yes Historical Provider, MD  dexamethasone (DECADRON) 6 MG tablet Take 6 mg by mouth 2 (two) times daily with a meal.   Yes Historical Provider, MD  HYDROcodone-acetaminophen (NORCO) 7.5-325 MG per tablet Take 1 tablet by mouth every 4 (four) hours as needed for moderate pain. 10/21/13  Yes Kathie DikeHobson M Bryant, PA-C  lidocaine (XYLOCAINE) 2 % solution Use as directed 15 mLs in the mouth or throat every 6 (six) hours as needed for mouth pain. 10/19/13  Yes Gerhard Munch, MD  lisinopril-hydrochlorothiazide (PRINZIDE,ZESTORETIC) 20-25 MG per tablet Take 1 tablet by mouth daily.     Yes Historical Provider, MD  traMADol (ULTRAM) 50 MG tablet Take 50 mg by mouth daily as needed for moderate pain.   Yes Historical Provider, MD   BP 147/102  Pulse 105  Temp(Src) 98.7 F (37.1 C) (Oral)  Resp 16  SpO2 100%  LMP 10/18/2013 Physical Exam  Nursing note and vitals reviewed. Constitutional: She is oriented to person, place, and time.  She appears well-developed and well-nourished. No distress.  HENT:  Head: Normocephalic and atraumatic.  Right Ear: Hearing, tympanic membrane, external ear and ear canal normal.  Left Ear: Hearing, tympanic membrane, external ear and ear canal normal.  Mouth/Throat: Uvula is midline. Oropharyngeal exudate ( bilaterally) and posterior oropharyngeal erythema present.  TM clear bilaterally with light reflex. Airway patent   Eyes: Conjunctivae and EOM are normal. Pupils are equal, round, and reactive to light.  Neck: Neck supple. No tracheal deviation present.  Cardiovascular: Tachycardia present.   Pulmonary/Chest: Effort normal. No respiratory distress. She has no wheezes. She has no rales.  Abdominal: Soft. Bowel sounds are normal. There is no tenderness. There is no CVA tenderness.  Musculoskeletal: Normal range of motion.  Lymphadenopathy:    She has cervical adenopathy.  Neurological: She is alert and oriented to person, place, and time.  Pulses strong. Adequate circulation  Skin: Skin is warm and dry.  Psychiatric: She has a normal mood and affect. Her behavior is normal.    ED Course  Procedures (including critical care time)  IV hydration, Clindamycin 600 mg IV, Dilaudid 1 mg IV, Zofran 4 mg IV, Pepcid 20 mg IV, labs, Potassium runs IV Labs Review Results for orders placed during the hospital encounter of 10/26/13 (from the past 24 hour(s))  CBC WITH DIFFERENTIAL     Status: Abnormal   Collection Time    10/26/13 11:02 AM      Result Value Ref Range   WBC 14.7 (*) 4.0 - 10.5 K/uL   RBC 3.76 (*) 3.87 - 5.11 MIL/uL   Hemoglobin 10.5 (*) 12.0 - 15.0 g/dL   HCT 54.0 (*) 98.1 - 19.1 %   MCV 86.7  78.0 - 100.0 fL   MCH 27.9  26.0 - 34.0 pg   MCHC 32.2  30.0 - 36.0 g/dL   RDW 47.8  29.5 - 62.1 %   Platelets 436 (*) 150 - 400 K/uL   Neutrophils Relative % 71  43 - 77 %   Lymphocytes Relative 16  12 - 46 %   Monocytes Relative 11  3 - 12 %   Eosinophils Relative 1  0 - 5 %    Basophils Relative 1  0 - 1 %   Neutro Abs 10.5 (*) 1.7 - 7.7 K/uL   Lymphs Abs 2.4  0.7 - 4.0 K/uL   Monocytes Absolute 1.6 (*) 0.1 - 1.0 K/uL   Eosinophils Absolute 0.1  0.0 - 0.7 K/uL   Basophils Absolute 0.1  0.0 - 0.1 K/uL   RBC Morphology POLYCHROMASIA PRESENT     WBC Morphology ATYPICAL LYMPHOCYTES    BASIC METABOLIC PANEL     Status: Abnormal   Collection Time    10/26/13 11:02 AM      Result Value Ref Range   Sodium 139  137 - 147 mEq/L   Potassium 2.6 (*) 3.7 - 5.3 mEq/L   Chloride 99  96 - 112 mEq/L   CO2 27  19 - 32 mEq/L   Glucose, Bld 101 (*) 70 - 99 mg/dL   BUN 15  6 - 23 mg/dL   Creatinine, Ser 6.570.77  0.50 - 1.10 mg/dL   Calcium 8.3 (*) 8.4 - 10.5 mg/dL   GFR calc non Af Amer >90  >90 mL/min   GFR calc Af Amer >90  >90 mL/min   Anion gap 13  5 - 15  PREGNANCY, URINE     Status: None   Collection Time    10/26/13 11:03 AM      Result Value Ref Range   Preg Test, Ur NEGATIVE  NEGATIVE    Imaging Review No results found.   EKG Interpretation   Date/Time:  Sunday October 26 2013 10:24:50 EDT Ventricular Rate:  110 PR Interval:  126 QRS Duration: 86 QT Interval:  330 QTC Calculation: 446 R Axis:   36 Text Interpretation:  Sinus tachycardia T wave abnormality, consider  inferior ischemia Compared to previous tracing T wave inversion aVF NOW  PRESENT Confirmed by Fonnie JarvisBEDNAR  MD, Jonny RuizJOHN ([id number]) on 10/26/2013 10:44:12 AM      MDM  33 y.o. female with worsening symptoms since previous visit to the ED. Tachycardia, hypokalemia, hypertensive.  Unable to eat due to pain. Discussed with Dr. Catha GosselinMikhail and she will evaluate the patient for admission and discuss with Dr. Suszanne Connerseoh. Dr. Catha GosselinMikhail spoke with Dr. Suszanne Connerseoh and [facility] and he will see her there.  Dr. Anitra LauthPlunkett [facility] notified of patient.   Dr. Fonnie JarvisBednar in to examine the patient.  Patient stable [facility] via [facility].   Vital signs rechecked and have improved.  BP  144/99  Pulse 96  Temp(Src) 97.9 F (36.6 C) (Oral)  Resp 14  SpO2 98%  LMP 10/18/2013   I personally performed the services described in this documentation, which was scribed in my presence. The recorded information has been reviewed and is accurate.      [facility] Orlene OchM Abdirahim Flavell, [place]NP 10/26/13 [phone]

## 2013-10-26 NOTE — ED Provider Notes (Signed)
I was available for consult during the completion of this patient's care, after transfer to this facility.   Denise Munchobert Cora Stetson, MD 10/26/13 [phone]

## 2013-10-26 NOTE — ED Notes (Signed)
Advised Nurse patient BP still levated

## 2013-10-26 NOTE — ED Notes (Signed)
Went in patients room to collection urine sample patient does not have at this time.

## 2013-10-30 ENCOUNTER — Ambulatory Visit (INDEPENDENT_AMBULATORY_CARE_PROVIDER_SITE_OTHER): Payer: Medicaid Other | Admitting: Otolaryngology

## 2013-10-30 DIAGNOSIS — J039 Acute tonsillitis, unspecified: Secondary | ICD-10-CM

## 2013-11-04 ENCOUNTER — Other Ambulatory Visit: Payer: Self-pay | Admitting: Otolaryngology

## 2013-11-17 ENCOUNTER — Encounter (HOSPITAL_BASED_OUTPATIENT_CLINIC_OR_DEPARTMENT_OTHER): Payer: Self-pay | Admitting: *Deleted

## 2013-11-17 NOTE — Pre-Procedure Instructions (Signed)
To come for BMET 

## 2013-11-18 ENCOUNTER — Encounter (HOSPITAL_BASED_OUTPATIENT_CLINIC_OR_DEPARTMENT_OTHER)
Admission: RE | Admit: 2013-11-18 | Discharge: 2013-11-18 | Disposition: A | Discharge status: Home / Self Care | Payer: Medicaid Other | Source: Ambulatory Visit | Attending: Otolaryngology | Admitting: Otolaryngology

## 2013-11-18 DIAGNOSIS — Z5309 Procedure and treatment not carried out because of other contraindication: Secondary | ICD-10-CM | POA: Diagnosis not present

## 2013-11-18 DIAGNOSIS — I10 Essential (primary) hypertension: Secondary | ICD-10-CM | POA: Diagnosis present

## 2013-11-18 LAB — BASIC METABOLIC PANEL
Anion gap: 13 (ref 5–15)
BUN: 11 mg/dL (ref 6–23)
CO2: 26 meq/L (ref 19–32)
CREATININE: 0.91 mg/dL (ref 0.50–1.10)
Calcium: 8.5 mg/dL (ref 8.4–10.5)
Chloride: 104 mEq/L (ref 96–112)
GFR calc Af Amer: >90 mL/min (ref >90)
GFR calc non Af Amer: 83 mL/min — ABNORMAL LOW (ref >90)
GLUCOSE: 90 mg/dL (ref 70–99)
Potassium: 3.4 mEq/L — ABNORMAL LOW (ref 3.7–5.3)
SODIUM: 143 meq/L (ref 137–147)

## 2013-11-24 ENCOUNTER — Encounter (HOSPITAL_BASED_OUTPATIENT_CLINIC_OR_DEPARTMENT_OTHER)
Admission: RE | Disposition: A | Discharge status: Home / Self Care | Payer: Self-pay | Source: Ambulatory Visit | Attending: Otolaryngology

## 2013-11-24 ENCOUNTER — Encounter (HOSPITAL_BASED_OUTPATIENT_CLINIC_OR_DEPARTMENT_OTHER): Payer: Self-pay | Admitting: Anesthesiology

## 2013-11-24 ENCOUNTER — Emergency Department (HOSPITAL_COMMUNITY)
Admission: EM | Admit: 2013-11-24 | Discharge: 2013-11-25 | Disposition: A | Discharge status: Home / Self Care | Payer: Medicaid Other | Attending: Emergency Medicine | Admitting: Emergency Medicine

## 2013-11-24 ENCOUNTER — Ambulatory Visit (HOSPITAL_BASED_OUTPATIENT_CLINIC_OR_DEPARTMENT_OTHER)
Admission: RE | Admit: 2013-11-24 | Discharge: 2013-11-24 | Disposition: A | Discharge status: Home / Self Care | Payer: Medicaid Other | Source: Ambulatory Visit | Attending: Otolaryngology | Admitting: Otolaryngology

## 2013-11-24 ENCOUNTER — Encounter (HOSPITAL_COMMUNITY): Payer: Self-pay | Admitting: Emergency Medicine

## 2013-11-24 DIAGNOSIS — J029 Acute pharyngitis, unspecified: Secondary | ICD-10-CM | POA: Insufficient documentation

## 2013-11-24 DIAGNOSIS — J039 Acute tonsillitis, unspecified: Secondary | ICD-10-CM | POA: Insufficient documentation

## 2013-11-24 DIAGNOSIS — I10 Essential (primary) hypertension: Secondary | ICD-10-CM | POA: Diagnosis not present

## 2013-11-24 DIAGNOSIS — Z5309 Procedure and treatment not carried out because of other contraindication: Secondary | ICD-10-CM | POA: Insufficient documentation

## 2013-11-24 DIAGNOSIS — G40909 Epilepsy, unspecified, not intractable, without status epilepticus: Secondary | ICD-10-CM | POA: Diagnosis not present

## 2013-11-24 DIAGNOSIS — Z79899 Other long term (current) drug therapy: Secondary | ICD-10-CM | POA: Insufficient documentation

## 2013-11-24 HISTORY — DX: Personal history of other specified conditions: Z87.898

## 2013-11-24 HISTORY — DX: Hypertrophy of tonsils with hypertrophy of adenoids: J35.3

## 2013-11-24 SURGERY — CANCELLED PROCEDURE

## 2013-11-24 MED ORDER — MIDAZOLAM HCL 2 MG/2ML IJ SOLN
INTRAMUSCULAR | Status: AC
  Filled 2013-11-24: qty 2

## 2013-11-24 MED ORDER — FENTANYL CITRATE 0.05 MG/ML IJ SOLN
INTRAMUSCULAR | Status: AC
  Filled 2013-11-24: qty 6

## 2013-11-24 SURGICAL SUPPLY — 30 items
BANDAGE COBAN STERILE 2 (GAUZE/BANDAGES/DRESSINGS) IMPLANT
CANISTER SUCT 1200ML W/VALVE (MISCELLANEOUS) ×1 IMPLANT
CATH ROBINSON RED A/P 10FR (CATHETERS) IMPLANT
CATH ROBINSON RED A/P 14FR (CATHETERS) IMPLANT
COAGULATOR SUCT 6 FR SWTCH (ELECTROSURGICAL)
COAGULATOR SUCT SWTCH 10FR 6 (ELECTROSURGICAL) IMPLANT
COVER MAYO STAND STRL (DRAPES) ×1 IMPLANT
ELECT REM PT RETURN 9FT ADLT (ELECTROSURGICAL)
ELECT REM PT RETURN 9FT PED (ELECTROSURGICAL)
ELECTRODE REM PT RETRN 9FT PED (ELECTROSURGICAL) IMPLANT
ELECTRODE REM PT RTRN 9FT ADLT (ELECTROSURGICAL) IMPLANT
GLOVE BIO SURGEON STRL SZ7.5 (GLOVE) ×1 IMPLANT
GOWN STRL REUS W/ TWL LRG LVL3 (GOWN DISPOSABLE) ×2 IMPLANT
GOWN STRL REUS W/TWL LRG LVL3 (GOWN DISPOSABLE)
IV NS 500ML (IV SOLUTION)
IV NS 500ML BAXH (IV SOLUTION) ×1 IMPLANT
MARKER SKIN DUAL TIP RULER LAB (MISCELLANEOUS) IMPLANT
NS IRRIG 1000ML POUR BTL (IV SOLUTION) ×1 IMPLANT
SHEET MEDIUM DRAPE 40X70 STRL (DRAPES) ×1 IMPLANT
SOLUTION BUTLER CLEAR DIP (MISCELLANEOUS) ×1 IMPLANT
SPONGE GAUZE 4X4 12PLY STER LF (GAUZE/BANDAGES/DRESSINGS) ×1 IMPLANT
SPONGE TONSIL 1 RF SGL (DISPOSABLE) IMPLANT
SPONGE TONSIL 1.25 RF SGL STRG (GAUZE/BANDAGES/DRESSINGS) IMPLANT
SYR BULB 3OZ (MISCELLANEOUS) IMPLANT
TOWEL OR 17X24 6PK STRL BLUE (TOWEL DISPOSABLE) ×1 IMPLANT
TUBE CONNECTING 20'X1/4 (TUBING)
TUBE CONNECTING 20X1/4 (TUBING) ×1 IMPLANT
TUBE SALEM SUMP 12R W/ARV (TUBING) IMPLANT
TUBE SALEM SUMP 16 FR W/ARV (TUBING) IMPLANT
WAND COBLATOR 70 EVAC XTRA (SURGICAL WAND) ×1 IMPLANT

## 2013-11-24 NOTE — Anesthesia Preprocedure Evaluation (Deleted)
Anesthesia Evaluation    Airway       Dental   Pulmonary          Cardiovascular hypertension,     Neuro/Psych    GI/Hepatic   Endo/Other    Renal/GU      Musculoskeletal   Abdominal   Peds  Hematology   Anesthesia Other Findings   Reproductive/Obstetrics                           Anesthesia Physical Anesthesia Plan  ASA:   Anesthesia Plan:    Post-op Pain Management:    Induction:   Airway Management Planned:   Additional Equipment:   Intra-op Plan:   Post-operative Plan:   Informed Consent:   Plan Discussed with:   Anesthesia Plan Comments: (Blood pressure was repeatedly taken and found to be ~180/125. Pt deferred and asked to see her physician to be under better control. She will reschedule and and Dr. Suszanne Connerseoh is aware and agrees.)        Anesthesia Quick Evaluation

## 2013-11-24 NOTE — ED Notes (Signed)
Patient complaining of sore throat. States "I was supposed to have my tonsils taken out today but my blood pressure was too high so they had to reschedule it. I don't have any more blood pressure pills and can't see my doctor for 3 weeks."

## 2013-11-25 MED ORDER — LISINOPRIL-HYDROCHLOROTHIAZIDE 20-25 MG PO TABS: 2.0000 | ORAL_TABLET | Freq: Every day | ORAL | Status: DC

## 2013-11-25 MED ORDER — SODIUM CHLORIDE 0.9 % IV SOLN
1.5000 g | Freq: Once | INTRAVENOUS | Status: AC
  Administered 2013-11-25: 1.5 g via INTRAVENOUS
  Filled 2013-11-25: qty 1.5

## 2013-11-25 MED ORDER — ACETAMINOPHEN 500 MG PO TABS
1000.0000 mg | ORAL_TABLET | Freq: Once | ORAL | Status: AC
Start: 2013-11-25 — End: 2013-11-25
  Administered 2013-11-25: 1000 mg via ORAL
  Filled 2013-11-25: qty 2

## 2013-11-25 MED ORDER — SODIUM CHLORIDE 0.9 % IV BOLUS (SEPSIS)
1000.0000 mL | Freq: Once | INTRAVENOUS | Status: AC
  Administered 2013-11-25: 1000 mL via INTRAVENOUS

## 2013-11-25 MED ORDER — AMOXICILLIN-POT CLAVULANATE 875-125 MG PO TABS: ORAL_TABLET | ORAL | Status: DC

## 2013-11-25 NOTE — ED Provider Notes (Signed)
CSN: [id number]     Arrival date & time 11/24/13  2214 History   This chart was scribed for Hanley Seamen, MD by Milly Jakob, ED Scribe. The patient was seen in room APA07/APA07. Patient's care was started at 12:01 AM.  Chief Complaint  Patient presents with  . Sore Throat   The history is provided by the patient. No language interpreter was used.   HPI Comments: Denise Walters is a 33 y.o. female who presents to the Emergency Department complaining of a sore throat which began two days ago. She reports that she was scheduled to have a tonsillectomy today, but that she could not because her BP was high. She reports that she ran out of her BP medication, and that she cannot se her doctor for 3 weeks to have it refilled.  She reports generalized weakness and states that she has not been drinking fluid due to her scheduled surgery. She reports that she is on ABX. She denies nausea, vomiting, diarrhea, chest pain, dark urine, and shortness of breath.   Past Medical History  Diagnosis Date  . History of seizure     x 1 around 2010 - unknown cause  . Hypertension     under control with med., has been on med. x 13 yr.  . Tonsillar and adenoid hypertrophy 10/2013   Past Surgical History  Procedure Laterality Date  . Cesarean section  10/05/2003  . Breast reduction surgery    . Cholecystectomy  12/01/2003  . Tubal ligation  01/08/2004   History reviewed. No pertinent family history. History  Substance Use Topics  . Smoking status: Never Smoker   . Smokeless tobacco: Never Used  . Alcohol Use: No   OB History   Grav Para Term Preterm Abortions TAB SAB Ect Mult Living                 Review of Systems  All other systems reviewed and are negative.  Allergies  Chocolate  Home Medications   Prior to Admission medications   Medication Sig Start Date End Date Taking? Authorizing Provider  clonazePAM (KLONOPIN) 1 MG tablet Take 1 mg by mouth at bedtime.   Yes Historical Provider, MD   lisinopril-hydrochlorothiazide (PRINZIDE,ZESTORETIC) 20-25 MG per tablet Take 2 tablets by mouth daily.    Yes Historical Provider, MD   Triage Vitals: BP 165/122  Pulse 138  Temp(Src) 100.5 F (38.1 C) (Oral)  Resp 20  Ht 5\' 2"  (1.575 m)  Wt 257 lb (116.574 kg)  BMI 46.99 kg/m2  SpO2 98%  LMP 11/15/2013  Physical Exam General: Well-developed, well-nourished female in no acute distress; appearance consistent with age of record HENT: normocephalic; atraumatic; tonsillar enlargement w/ tonsillar exudate, left greater than right; uvula midline; no trismus Eyes: pupils equal, round and reactive to light; extraocular muscles intact Neck: supple; anterior cervical adenopathy  Heart: regular rate and rhythm; no murmurs, rubs or gallops Lungs: clear to auscultation bilaterally Abdomen: soft; nondistended; nontender; no masses or hepatosplenomegaly; bowel sounds present Extremities: No deformity; full range of motion; pulses normal Neurologic: Awake, alert and oriented; motor function intact in all extremities and symmetric; no facial droop Skin: Warm and dry Psychiatric: Normal mood and affect  ED Course  Procedures (including critical care time)  MDM  1:20 AM Unasym 1g IV given for tonsillitis. Will discharge on Augmentin and refill her antihypertensive.  I personally performed the services described in this documentation, which was scribed in my presence. The recorded information  has been reviewed and is accurate.   Hanley SeamenJohn L Amandine Covino, MD 11/25/13 0120

## 2014-01-05 ENCOUNTER — Other Ambulatory Visit: Payer: Self-pay | Admitting: Otolaryngology

## 2014-01-27 ENCOUNTER — Encounter (HOSPITAL_BASED_OUTPATIENT_CLINIC_OR_DEPARTMENT_OTHER): Payer: Self-pay | Admitting: *Deleted

## 2014-01-27 NOTE — Pre-Procedure Instructions (Signed)
To come for BMET and anesthesia airway evaluation. 

## 2014-02-02 ENCOUNTER — Ambulatory Visit (HOSPITAL_BASED_OUTPATIENT_CLINIC_OR_DEPARTMENT_OTHER): Admission: RE | Admit: 2014-02-02 | Payer: Medicaid Other | Source: Ambulatory Visit | Admitting: Otolaryngology

## 2014-02-02 SURGERY — TONSILLECTOMY AND ADENOIDECTOMY
Anesthesia: General

## 2014-04-24 DIAGNOSIS — J353 Hypertrophy of tonsils with hypertrophy of adenoids: Secondary | ICD-10-CM

## 2014-04-24 HISTORY — DX: Hypertrophy of tonsils with hypertrophy of adenoids: J35.3

## 2014-04-28 ENCOUNTER — Encounter (HOSPITAL_BASED_OUTPATIENT_CLINIC_OR_DEPARTMENT_OTHER): Payer: Self-pay | Admitting: *Deleted

## 2014-04-28 NOTE — Pre-Procedure Instructions (Signed)
To come for BMET and BP check.  Does not need to be seen for airway evaluation, per Dr. Ivin Bootyrews.

## 2014-05-04 ENCOUNTER — Encounter (HOSPITAL_BASED_OUTPATIENT_CLINIC_OR_DEPARTMENT_OTHER)
Admission: RE | Admit: 2014-05-04 | Discharge: 2014-05-04 | Disposition: A | Discharge status: Home / Self Care | Payer: Medicaid Other | Source: Ambulatory Visit | Attending: Otolaryngology | Admitting: Otolaryngology

## 2014-05-04 DIAGNOSIS — I10 Essential (primary) hypertension: Secondary | ICD-10-CM | POA: Diagnosis not present

## 2014-05-04 DIAGNOSIS — J3501 Chronic tonsillitis: Secondary | ICD-10-CM | POA: Diagnosis not present

## 2014-05-04 DIAGNOSIS — Z6841 Body Mass Index (BMI) 40.0 and over, adult: Secondary | ICD-10-CM | POA: Diagnosis not present

## 2014-05-04 DIAGNOSIS — J353 Hypertrophy of tonsils with hypertrophy of adenoids: Secondary | ICD-10-CM | POA: Diagnosis present

## 2014-05-04 DIAGNOSIS — J029 Acute pharyngitis, unspecified: Secondary | ICD-10-CM | POA: Diagnosis not present

## 2014-05-04 LAB — BASIC METABOLIC PANEL
ANION GAP: 8 (ref 5–15)
BUN: 7 mg/dL (ref 6–23)
CO2: 27 mmol/L (ref 19–32)
Calcium: 9.3 mg/dL (ref 8.4–10.5)
Chloride: 100 mEq/L (ref 96–112)
Creatinine, Ser: 0.72 mg/dL (ref 0.50–1.10)
GFR calc non Af Amer: >90 mL/min (ref >90)
Glucose, Bld: 81 mg/dL (ref 70–99)
Potassium: 3.3 mmol/L — ABNORMAL LOW (ref 3.5–5.1)
SODIUM: 135 mmol/L (ref 135–145)

## 2014-05-04 NOTE — Progress Notes (Signed)
Patient in for labs and recheck of BP. Patient is on Lisinopril/HCTZ 20/25 and she states that she has been taking it. BP rt arm 181/103, lt arm 161/110. Dr Jacklynn BueMassagee made aware, will recheck in am before surgery. Told patient to be sure to take med in the am with a sip of water.

## 2014-05-05 ENCOUNTER — Ambulatory Visit (HOSPITAL_BASED_OUTPATIENT_CLINIC_OR_DEPARTMENT_OTHER): Payer: Medicaid Other | Admitting: Anesthesiology

## 2014-05-05 ENCOUNTER — Ambulatory Visit (HOSPITAL_BASED_OUTPATIENT_CLINIC_OR_DEPARTMENT_OTHER)
Admission: RE | Admit: 2014-05-05 | Discharge: 2014-05-05 | Disposition: A | Discharge status: Home / Self Care | Payer: Medicaid Other | Source: Ambulatory Visit | Attending: Otolaryngology | Admitting: Otolaryngology

## 2014-05-05 ENCOUNTER — Encounter (HOSPITAL_BASED_OUTPATIENT_CLINIC_OR_DEPARTMENT_OTHER): Payer: Self-pay | Admitting: *Deleted

## 2014-05-05 ENCOUNTER — Encounter (HOSPITAL_BASED_OUTPATIENT_CLINIC_OR_DEPARTMENT_OTHER)
Admission: RE | Disposition: A | Discharge status: Home / Self Care | Payer: Self-pay | Source: Ambulatory Visit | Attending: Otolaryngology

## 2014-05-05 DIAGNOSIS — I10 Essential (primary) hypertension: Secondary | ICD-10-CM | POA: Insufficient documentation

## 2014-05-05 DIAGNOSIS — J029 Acute pharyngitis, unspecified: Secondary | ICD-10-CM | POA: Diagnosis not present

## 2014-05-05 DIAGNOSIS — J3501 Chronic tonsillitis: Secondary | ICD-10-CM | POA: Insufficient documentation

## 2014-05-05 DIAGNOSIS — J3503 Chronic tonsillitis and adenoiditis: Secondary | ICD-10-CM

## 2014-05-05 DIAGNOSIS — J353 Hypertrophy of tonsils with hypertrophy of adenoids: Secondary | ICD-10-CM | POA: Insufficient documentation

## 2014-05-05 DIAGNOSIS — Z6841 Body Mass Index (BMI) 40.0 and over, adult: Secondary | ICD-10-CM | POA: Insufficient documentation

## 2014-05-05 HISTORY — PX: TONSILLECTOMY AND ADENOIDECTOMY: SHX28

## 2014-05-05 LAB — POCT HEMOGLOBIN-HEMACUE: HEMOGLOBIN: 11.9 g/dL — AB (ref 12.0–15.0)

## 2014-05-05 SURGERY — TONSILLECTOMY AND ADENOIDECTOMY
Anesthesia: General | Site: Mouth | Laterality: Bilateral

## 2014-05-05 MED ORDER — SODIUM CHLORIDE 0.9 % IR SOLN
Status: DC | PRN
  Administered 2014-05-05: 1

## 2014-05-05 MED ORDER — HYDROMORPHONE HCL 1 MG/ML IJ SOLN
INTRAMUSCULAR | Status: AC
  Filled 2014-05-05: qty 1

## 2014-05-05 MED ORDER — OXYMETAZOLINE HCL 0.05 % NA SOLN
NASAL | Status: AC
  Filled 2014-05-05: qty 15

## 2014-05-05 MED ORDER — BACITRACIN ZINC 500 UNIT/GM EX OINT
TOPICAL_OINTMENT | CUTANEOUS | Status: AC
  Filled 2014-05-05: qty 0.9

## 2014-05-05 MED ORDER — BACITRACIN 500 UNIT/GM EX OINT
TOPICAL_OINTMENT | CUTANEOUS | Status: DC | PRN
  Administered 2014-05-05: 1 via TOPICAL

## 2014-05-05 MED ORDER — AMOXICILLIN 400 MG/5ML PO SUSR: 800.0000 mg | Freq: Two times a day (BID) | ORAL | Status: AC

## 2014-05-05 MED ORDER — PROPOFOL 10 MG/ML IV BOLUS
INTRAVENOUS | Status: DC | PRN
  Administered 2014-05-05 (×2): 50 mg via INTRAVENOUS
  Administered 2014-05-05: 300 mg via INTRAVENOUS

## 2014-05-05 MED ORDER — HYDROMORPHONE HCL 1 MG/ML IJ SOLN
0.2500 mg | INTRAMUSCULAR | Status: DC | PRN
  Administered 2014-05-05 (×2): 0.25 mg via INTRAVENOUS
  Administered 2014-05-05: 0.5 mg via INTRAVENOUS

## 2014-05-05 MED ORDER — LACTATED RINGERS IV SOLN
INTRAVENOUS | Status: DC
  Administered 2014-05-05 (×2): via INTRAVENOUS

## 2014-05-05 MED ORDER — MIDAZOLAM HCL 5 MG/5ML IJ SOLN
INTRAMUSCULAR | Status: DC | PRN
  Administered 2014-05-05: 2 mg via INTRAVENOUS

## 2014-05-05 MED ORDER — OXYCODONE HCL 5 MG/5ML PO SOLN
ORAL | Status: AC
  Filled 2014-05-05: qty 5

## 2014-05-05 MED ORDER — OXYCODONE HCL 5 MG PO TABS: 5.0000 mg | ORAL_TABLET | Freq: Once | ORAL | Status: AC | PRN

## 2014-05-05 MED ORDER — SCOPOLAMINE 1 MG/3DAYS TD PT72
MEDICATED_PATCH | TRANSDERMAL | Status: AC
  Filled 2014-05-05: qty 1

## 2014-05-05 MED ORDER — MIDAZOLAM HCL 2 MG/ML PO SYRP: 12.0000 mg | ORAL_SOLUTION | Freq: Once | ORAL | Status: DC | PRN

## 2014-05-05 MED ORDER — DEXAMETHASONE SODIUM PHOSPHATE 4 MG/ML IJ SOLN
INTRAMUSCULAR | Status: DC | PRN
  Administered 2014-05-05: 10 mg via INTRAVENOUS

## 2014-05-05 MED ORDER — MIDAZOLAM HCL 2 MG/2ML IJ SOLN
INTRAMUSCULAR | Status: AC
  Filled 2014-05-05: qty 2

## 2014-05-05 MED ORDER — SUCCINYLCHOLINE CHLORIDE 20 MG/ML IJ SOLN
INTRAMUSCULAR | Status: DC | PRN
  Administered 2014-05-05: 100 mg via INTRAVENOUS

## 2014-05-05 MED ORDER — ONDANSETRON HCL 4 MG/2ML IJ SOLN
INTRAMUSCULAR | Status: DC | PRN
  Administered 2014-05-05: 4 mg via INTRAVENOUS

## 2014-05-05 MED ORDER — MIDAZOLAM HCL 2 MG/2ML IJ SOLN: 1.0000 mg | INTRAMUSCULAR | Status: DC | PRN

## 2014-05-05 MED ORDER — FENTANYL CITRATE 0.05 MG/ML IJ SOLN
INTRAMUSCULAR | Status: AC
  Filled 2014-05-05: qty 4

## 2014-05-05 MED ORDER — OXYCODONE HCL 5 MG/5ML PO SOLN
5.0000 mg | Freq: Once | ORAL | Status: AC | PRN
  Administered 2014-05-05: 5 mg via ORAL

## 2014-05-05 MED ORDER — OXYCODONE HCL 5 MG/5ML PO SOLN: 5.0000 mg | ORAL | Status: DC | PRN

## 2014-05-05 MED ORDER — FENTANYL CITRATE 0.05 MG/ML IJ SOLN: 50.0000 ug | INTRAMUSCULAR | Status: DC | PRN

## 2014-05-05 MED ORDER — SCOPOLAMINE 1 MG/3DAYS TD PT72
1.0000 | MEDICATED_PATCH | TRANSDERMAL | Status: DC
  Administered 2014-05-05: 1.5 mg via TRANSDERMAL

## 2014-05-05 MED ORDER — OXYMETAZOLINE HCL 0.05 % NA SOLN
NASAL | Status: DC | PRN
  Administered 2014-05-05: 1

## 2014-05-05 MED ORDER — FENTANYL CITRATE 0.05 MG/ML IJ SOLN
INTRAMUSCULAR | Status: DC | PRN
  Administered 2014-05-05: 100 ug via INTRAVENOUS

## 2014-05-05 MED ORDER — ONDANSETRON HCL 4 MG/2ML IJ SOLN: 4.0000 mg | Freq: Once | INTRAMUSCULAR | Status: DC | PRN

## 2014-05-05 MED ORDER — LIDOCAINE HCL (CARDIAC) 20 MG/ML IV SOLN
INTRAVENOUS | Status: DC | PRN
  Administered 2014-05-05: 100 mg via INTRAVENOUS

## 2014-05-05 SURGICAL SUPPLY — 33 items
BANDAGE COBAN STERILE 2 (GAUZE/BANDAGES/DRESSINGS) IMPLANT
CANISTER SUCT 1200ML W/VALVE (MISCELLANEOUS) ×3 IMPLANT
CATH ROBINSON RED A/P 10FR (CATHETERS) IMPLANT
CATH ROBINSON RED A/P 14FR (CATHETERS) ×2 IMPLANT
COAGULATOR SUCT 6 FR SWTCH (ELECTROSURGICAL)
COAGULATOR SUCT SWTCH 10FR 6 (ELECTROSURGICAL) IMPLANT
COVER MAYO STAND STRL (DRAPES) ×3 IMPLANT
ELECT REM PT RETURN 9FT ADLT (ELECTROSURGICAL) ×3
ELECT REM PT RETURN 9FT PED (ELECTROSURGICAL)
ELECTRODE REM PT RETRN 9FT PED (ELECTROSURGICAL) IMPLANT
ELECTRODE REM PT RTRN 9FT ADLT (ELECTROSURGICAL) IMPLANT
GLOVE BIO SURGEON STRL SZ7.5 (GLOVE) ×3 IMPLANT
GLOVE SURG SS PI 7.0 STRL IVOR (GLOVE) ×2 IMPLANT
GOWN STRL REUS W/ TWL LRG LVL3 (GOWN DISPOSABLE) ×2 IMPLANT
GOWN STRL REUS W/TWL LRG LVL3 (GOWN DISPOSABLE) ×6
IV NS 500ML (IV SOLUTION) ×3
IV NS 500ML BAXH (IV SOLUTION) ×1 IMPLANT
MARKER SKIN DUAL TIP RULER LAB (MISCELLANEOUS) IMPLANT
NS IRRIG 1000ML POUR BTL (IV SOLUTION) ×3 IMPLANT
PLASMABLADE SUCTION COAG TIP (TIP) IMPLANT
PLASMABLADE TNA (BLADE) IMPLANT
SHEET MEDIUM DRAPE 40X70 STRL (DRAPES) ×3 IMPLANT
SOLUTION BUTLER CLEAR DIP (MISCELLANEOUS) ×3 IMPLANT
SPONGE GAUZE 4X4 12PLY STER LF (GAUZE/BANDAGES/DRESSINGS) ×3 IMPLANT
SPONGE TONSIL 1 RF SGL (DISPOSABLE) IMPLANT
SPONGE TONSIL 1.25 RF SGL STRG (GAUZE/BANDAGES/DRESSINGS) ×2 IMPLANT
SYR BULB 3OZ (MISCELLANEOUS) IMPLANT
TOWEL OR 17X24 6PK STRL BLUE (TOWEL DISPOSABLE) ×3 IMPLANT
TUBE CONNECTING 20'X1/4 (TUBING) ×1
TUBE CONNECTING 20X1/4 (TUBING) ×2 IMPLANT
TUBE SALEM SUMP 12R W/ARV (TUBING) IMPLANT
TUBE SALEM SUMP 16 FR W/ARV (TUBING) ×2 IMPLANT
WAND COBLATOR 70 EVAC XTRA (SURGICAL WAND) ×2 IMPLANT

## 2014-05-05 NOTE — Anesthesia Postprocedure Evaluation (Signed)
  Anesthesia Post-op Note  Patient: Denise Walters  Procedure(s) Performed: Procedure(s): BILATERAL TONSILLECTOMY  W/  ABLATATION OF ADENOIDS (Bilateral)  Patient Location: PACU  Anesthesia Type: General   Level of Consciousness: awake, alert  and oriented  Airway and Oxygen Therapy: Patient Spontanous Breathing  Post-op Pain: mild  Post-op Assessment: Post-op Vital signs reviewed  Post-op Vital Signs: Reviewed  Last Vitals:  Filed Vitals:   05/05/14 0915  BP: 182/102  Pulse: 96  Temp:   Resp: 18    Complications: No apparent anesthesia complications

## 2014-05-05 NOTE — H&P (Signed)
Cc: Chronic tonsillitis/pharyngitis  HPI: The patient is a 34 year old female who presents for her follow-up evaluation.  [facility]  At that time, she was noted to have severe acute tonsillitis.  She was treated with IV Decadron and clindamycin.  She was discharged home on p.o. clindamycin. The patient returns today reporting significant improvement in her sore throat.  She is also tolerating oral intake better.  The patient has a history of frequent sore throat.  She has had 3 episodes of tonsillitis over the past year.  She has no previous history of ENT surgery. No other ENT, GI, or respiratory issue noted since the last visit.   Exam General: Communicates without difficulty, well nourished, no acute distress. Head: Normocephalic, no evidence injury, no tenderness, facial buttresses intact without stepoff. Eyes: PERRL, EOMI. No scleral icterus, conjunctivae clear. Neuro: CN II exam reveals vision grossly intact.  No nystagmus at any point of gaze. Ears: Auricles well formed without lesions.  Ear canals are intact without mass or lesion.  No erythema or edema is appreciated.  The TMs are intact without fluid. Nose: External evaluation reveals normal support and skin without lesions.  Dorsum is intact.  Anterior rhinoscopy reveals healthy pink mucosa over anterior aspect of inferior turbinates and intact septum.  No purulence noted. Oral:  Oral cavity and oropharynx are intact, symmetric.  Mild bilateral tonsillar erythema. Neck: Full range of motion without pain.  There is no significant lymphadenopathy.  No masses palpable.  Thyroid bed within normal limits to palpation.  Parotid glands and submandibular glands equal bilaterally without mass.  Trachea is midline. Neuro:  CN 2-12 grossly intact. Gait normal.   Assessment 1.  Significant improvement in the patient's acute tonsillitis.  Only mild edema and erythema are noted bilaterally today.  2.  History of recurrent  tonsillitis.   Plan  1.  The patient is instructed to complete her current course of clindamycin.  2.  The option of adenotonsillectomy to treat her recurrent tonsillitis is also discussed.  The risks, benefits, alternatives and details of the procedure are reviewed.  3.  The patient would like to proceed with the adenotonsillectomy procedure.

## 2014-05-05 NOTE — Op Note (Signed)
DATE OF PROCEDURE:  05/05/2014                              OPERATIVE REPORT  SURGEON:  Newman PiesSu Hondo Nanda, MD  PREOPERATIVE DIAGNOSES: 1. Adenotonsillar hypertrophy. 2. Chronic tonsillitis and pharyngitis  POSTOPERATIVE DIAGNOSES: 1. Adenotonsillar hypertrophy. 2. Chronic tonsillitis and pharyngitis  PROCEDURE PERFORMED:  Adenotonsillectomy.  ANESTHESIA:  General endotracheal tube anesthesia.  COMPLICATIONS:  None.  ESTIMATED BLOOD LOSS:  Minimal.  INDICATION FOR PROCEDURE:  Denise Walters is a 34 y.o. female with a history of chronic tonsillitis/pharyngitis and halitosis.  According to the patient, she has been experiencing chronic throat discomfort for several years. The patient continues to be symptomatic despite medical treatments. On examination, the patient was noted to have bilateral cryptic tonsils, with numerous tonsilloliths. Based on the above findings, the decision was made for the patient to undergo the adenotonsillectomy procedure. Likelihood of success in reducing symptoms was also discussed.  The risks, benefits, alternatives, and details of the procedure were discussed with the mother.  Questions were invited and answered.  Informed consent was obtained.  DESCRIPTION:  The patient was taken to the operating room and placed supine on the operating table.  General endotracheal tube anesthesia was administered by the anesthesiologist.  The patient was positioned and prepped and draped in a standard fashion for adenotonsillectomy.  A Crowe-Davis mouth gag was inserted into the oral cavity for exposure. 3+ cryptic tonsils were noted bilaterally.  No bifidity was noted.  Indirect mirror examination of the nasopharynx revealed mild adenoid hypertrophy. The adenoid was ablated with the Coblator device. Hemostasis was achieved with the Coblator device.  The right tonsil was then grasped with a straight Allis clamp and retracted medially.  It was resected free from the underlying pharyngeal  constrictor muscles with the Coblator device.  The same procedure was repeated on the left side without exception.  The surgical sites were copiously irrigated.  The mouth gag was removed.  The care of the patient was turned over to the anesthesiologist.  The patient was awakened from anesthesia without difficulty.  The patient was extubated and transferred to the recovery room in good condition.  OPERATIVE FINDINGS:  Adenotonsillar hypertrophy.  SPECIMEN:  Bilateral tonsils  FOLLOWUP CARE:  The patient will be discharged home once awake and alert.  She will be placed on amoxicillin 800 mg p.o. b.i.d. for 5 days, and Roxicet 5-2110ml po q 4 hours for postop pain control.   The patient will follow up in my office in approximately 2 weeks.  Darletta MollEOH,SUI W 05/05/2014 8:13 AM

## 2014-05-05 NOTE — Anesthesia Preprocedure Evaluation (Signed)
Anesthesia Evaluation  Patient identified by MRN, date of birth, ID band Patient awake    Reviewed: Allergy & Precautions, NPO status , Patient's Chart, lab work & pertinent test results  Airway Mallampati: II  TM Distance: >3 FB Neck ROM: Full    Dental  (+) Teeth Intact, Dental Advisory Given   Pulmonary  breath sounds clear to auscultation        Cardiovascular hypertension, Pt. on medications Rhythm:Regular Rate:Normal     Neuro/Psych    GI/Hepatic   Endo/Other  Morbid obesity  Renal/GU      Musculoskeletal   Abdominal   Peds  Hematology   Anesthesia Other Findings   Reproductive/Obstetrics                             Anesthesia Physical Anesthesia Plan  ASA: II  Anesthesia Plan: General   Post-op Pain Management:    Induction: Intravenous  Airway Management Planned: Oral ETT  Additional Equipment:   Intra-op Plan:   Post-operative Plan: Extubation in OR  Informed Consent: I have reviewed the patients History and Physical, chart, labs and discussed the procedure including the risks, benefits and alternatives for the proposed anesthesia with the patient or authorized representative who has indicated his/her understanding and acceptance.   Dental advisory given  Plan Discussed with: CRNA, Anesthesiologist and Surgeon  Anesthesia Plan Comments:         Anesthesia Quick Evaluation

## 2014-05-05 NOTE — Anesthesia Procedure Notes (Signed)
Procedure Name: Intubation Date/Time: 05/05/2014 7:42 AM Performed by: Caren MacadamARTER, Jasmina Gendron W Pre-anesthesia Checklist: Patient identified, Emergency Drugs available, Suction available and Patient being monitored Patient Re-evaluated:Patient Re-evaluated prior to inductionOxygen Delivery Method: Circle System Utilized Preoxygenation: Pre-oxygenation with 100% oxygen Intubation Type: IV induction Ventilation: Mask ventilation without difficulty Laryngoscope Size: Miller and 2 Grade View: Grade I Tube type: Oral Tube size: 7.0 mm Number of attempts: 1 Airway Equipment and Method: stylet and oral airway Placement Confirmation: ETT inserted through vocal cords under direct vision,  positive ETCO2 and breath sounds checked- equal and bilateral Secured at: 22 cm Tube secured with: Tape Dental Injury: Teeth and Oropharynx as per pre-operative assessment

## 2014-05-05 NOTE — Discharge Instructions (Signed)
°Post Anesthesia Home Care Instructions ° °Activity: °Get plenty of rest for the remainder of the day. A responsible adult should stay with you for 24 hours following the procedure.  °For the next 24 hours, DO NOT: °-Drive a car °-Operate machinery °-Drink alcoholic beverages °-Take any medication unless instructed by your physician °-Make any legal decisions or sign important papers. ° °Meals: °Start with liquid foods such as gelatin or soup. Progress to regular foods as tolerated. Avoid greasy, spicy, heavy foods. If nausea and/or vomiting occur, drink only clear liquids until the nausea and/or vomiting subsides. Call your physician if vomiting continues. ° °Special Instructions/Symptoms: °Your throat may feel dry or sore from the anesthesia or the breathing tube placed in your throat during surgery. If this causes discomfort, gargle with warm salt water. The discomfort should disappear within 24 hours. ° °------------------ ° °Janaia Kozel [facility] °Postoperative Instructions for Tonsillectomy & Adenoidectomy (T&A) °Activity °Restrict activity at home for the first two days, resting as much as possible. Light indoor activity is best. You may usually return to school or work within a week but void strenuous activity and sports for two weeks. Sleep with your head elevated on 2-3 pillows for 3-4 days to help decrease swelling. °Diet °Due to tissue swelling and throat discomfort, you may have little desire to drink for several days. However fluids are very important to prevent dehydration. You will find that non-acidic juices, soups, popsicles, Jell-O, custard, puddings, and any soft or mashed foods taken in small quantities can be swallowed fairly easily. Try to increase your fluid and food intake as the discomfort subsides. It is recommended that a child receive 1-1/2 quarts of fluid in a 24-hour period. Adult require twice this amount.  °Discomfort °Your sore throat may be relieved by applying an ice collar to  your neck and/or by taking Tylenol®. You may experience an earache, which is due to referred pain from the throat. Referred ear pain is commonly felt at night when trying to rest. ° °Bleeding                        Although rare, there is risk of having some bleeding during the first 2 weeks after having a T&A. This usually happens between days 7-10 postoperatively. If you or your child should have any bleeding, try to remain calm. We recommend sitting up quietly in a chair and gently spitting out the blood into a bowl. For adults, gargling gently with ice water may help. If the bleeding does not stop after a short time (5 minutes), is more than 1 teaspoonful, or if you become worried, please call our office at [phone] or go directly to the nearest hospital emergency room. Do not eat or drink anything prior to going to the hospital as you may need to be taken to the operating room in order to control the bleeding. °GENERAL CONSIDERATIONS °1. Brush your teeth regularly. Avoid mouthwashes and gargles for three weeks. You may gargle gently with warm salt-water as necessary or spray with Chloraseptic®. You may make salt-water by placing 2 teaspoons of table salt into a quart of fresh water. Warm the salt-water in a microwave to a luke warm temperature.  °2. Avoid exposure to colds and upper respiratory infections if possible.  °3. If you look into a mirror or into your child's mouth, you will see white-gray patches in the back of the throat. This is normal after having a T&A and   is like a scab that forms on the skin after an abrasion. It will disappear once the back of the throat heals completely. However, it may cause a noticeable odor; this too will disappear with time. Again, warm salt-water gargles may be used to help keep the throat clean and promote healing.  4. You may notice a temporary change in voice quality, such as a higher pitched voice or a nasal sound, until healing is complete. This may last for 1-2  weeks and should resolve.  5. Do not take or give you child any medications that we have not prescribed or recommended.  6. Snoring may occur, especially at night, for the first week after a T&A. It is due to swelling of the soft palate and will usually resolve.  Please call our office at [phone] if you have any questions.

## 2014-05-05 NOTE — Transfer of Care (Signed)
Immediate Anesthesia Transfer of Care Note  Patient: Denise Walters  Procedure(s) Performed: Procedure(s): BILATERAL TONSILLECTOMY  W/  ABLATATION OF ADENOIDS (Bilateral)  Patient Location: PACU  Anesthesia Type:General  Level of Consciousness: awake  Airway & Oxygen Therapy: Patient Spontanous Breathing and Patient connected to face mask oxygen  Post-op Assessment: Report given to PACU RN and Post -op Vital signs reviewed and stable  Post vital signs: Reviewed and stable  Complications: No apparent anesthesia complications

## 2014-05-06 ENCOUNTER — Encounter (HOSPITAL_BASED_OUTPATIENT_CLINIC_OR_DEPARTMENT_OTHER): Payer: Self-pay | Admitting: Otolaryngology

## 2014-05-07 ENCOUNTER — Encounter (HOSPITAL_BASED_OUTPATIENT_CLINIC_OR_DEPARTMENT_OTHER): Payer: Self-pay | Admitting: Otolaryngology

## 2014-05-12 ENCOUNTER — Other Ambulatory Visit (HOSPITAL_COMMUNITY): Payer: Medicaid Other

## 2014-05-21 ENCOUNTER — Ambulatory Visit (INDEPENDENT_AMBULATORY_CARE_PROVIDER_SITE_OTHER): Payer: Medicaid Other | Admitting: Otolaryngology

## 2014-12-23 ENCOUNTER — Encounter (HOSPITAL_COMMUNITY): Payer: Self-pay | Admitting: Emergency Medicine

## 2014-12-23 ENCOUNTER — Emergency Department (HOSPITAL_COMMUNITY)
Admission: EM | Admit: 2014-12-23 | Discharge: 2014-12-23 | Disposition: A | Discharge status: Home / Self Care | Payer: Medicaid Other | Attending: Emergency Medicine | Admitting: Emergency Medicine

## 2014-12-23 DIAGNOSIS — Z8709 Personal history of other diseases of the respiratory system: Secondary | ICD-10-CM | POA: Diagnosis not present

## 2014-12-23 DIAGNOSIS — I10 Essential (primary) hypertension: Secondary | ICD-10-CM | POA: Insufficient documentation

## 2014-12-23 DIAGNOSIS — L239 Allergic contact dermatitis, unspecified cause: Secondary | ICD-10-CM | POA: Diagnosis not present

## 2014-12-23 DIAGNOSIS — G8929 Other chronic pain: Secondary | ICD-10-CM | POA: Diagnosis not present

## 2014-12-23 DIAGNOSIS — R21 Rash and other nonspecific skin eruption: Secondary | ICD-10-CM | POA: Diagnosis present

## 2014-12-23 DIAGNOSIS — E669 Obesity, unspecified: Secondary | ICD-10-CM | POA: Insufficient documentation

## 2014-12-23 DIAGNOSIS — Z79899 Other long term (current) drug therapy: Secondary | ICD-10-CM | POA: Insufficient documentation

## 2014-12-23 HISTORY — DX: Headache: R51

## 2014-12-23 HISTORY — DX: Headache, unspecified: R51.9

## 2014-12-23 HISTORY — DX: Other chronic pain: G89.29

## 2014-12-23 MED ORDER — FAMOTIDINE 20 MG PO TABS: 20.0000 mg | ORAL_TABLET | Freq: Two times a day (BID) | ORAL | Status: DC

## 2014-12-23 MED ORDER — PREDNISONE 20 MG PO TABS
40.0000 mg | ORAL_TABLET | Freq: Once | ORAL | Status: AC
  Administered 2014-12-23: 40 mg via ORAL
  Filled 2014-12-23: qty 2

## 2014-12-23 MED ORDER — PREDNISONE 10 MG PO TABS: 20.0000 mg | ORAL_TABLET | Freq: Two times a day (BID) | ORAL | Status: DC

## 2014-12-23 MED ORDER — HYDROXYZINE HCL 25 MG PO TABS: 25.0000 mg | ORAL_TABLET | Freq: Four times a day (QID) | ORAL | Status: DC

## 2014-12-23 MED ORDER — FAMOTIDINE 20 MG PO TABS
20.0000 mg | ORAL_TABLET | Freq: Once | ORAL | Status: AC
Start: 2014-12-23 — End: 2014-12-23
  Administered 2014-12-23: 20 mg via ORAL
  Filled 2014-12-23: qty 1

## 2014-12-23 MED ORDER — HYDROXYZINE HCL 25 MG PO TABS
25.0000 mg | ORAL_TABLET | Freq: Once | ORAL | Status: AC
  Administered 2014-12-23: 25 mg via ORAL
  Filled 2014-12-23: qty 1

## 2014-12-23 MED ORDER — FAMOTIDINE IN NACL 20-0.9 MG/50ML-% IV SOLN: 20.0000 mg | Freq: Once | INTRAVENOUS | Status: DC

## 2014-12-23 NOTE — ED Provider Notes (Signed)
CSN: [id number]     Arrival date & time 12/23/14  1903 History   First MD Initiated Contact with Patient 12/23/14 2031     Chief Complaint  Patient presents with  . Rash     (Consider location/radiation/quality/duration/timing/severity/associated sxs/prior Treatment) Patient is a 34 y.o. female presenting with rash. The history is provided by the patient.  Rash Location:  Face Facial rash location:  Face Quality: itchiness and redness   Severity:  Moderate Timing:  Constant Progression:  Worsening Chronicity:  New Relieved by:  None tried  RICHELLE Walters is a 34 y.o. female who presents to the ED with a rash to her face that started yesterday. Patient reports that she mowed the grass yesterday and when she went in to take a shower she noted itching of her face and a few bumps. Today the rash is worse and is itching. She denies any change in detergents, foods, clothes, soap or lotions.   Past Medical History  Diagnosis Date  . History of seizure     x 1 around 2010 - unknown cause  . Tonsillar and adenoid hypertrophy 04/2014  . Hypertension     states under control since 11/2013  . Chronic headache    Past Surgical History  Procedure Laterality Date  . Cesarean section  10/05/2003  . Breast reduction surgery    . Cholecystectomy  12/01/2003  . Tubal ligation  01/08/2004  . Tonsillectomy and adenoidectomy Bilateral 05/05/2014    Procedure: BILATERAL TONSILLECTOMY  W/  ABLATATION OF ADENOIDS;  Surgeon: Darletta Moll, MD;  Location: [facility];  Service: ENT;  Laterality: Bilateral;   No family history on file. Social History  Substance Use Topics  . Smoking status: Never Smoker   . Smokeless tobacco: Never Used  . Alcohol Use: No   OB History    No data available     Review of Systems  Skin: Positive for rash.  all other systems negative    Allergies  Chocolate  Home Medications   Prior to Admission medications   Medication Sig Start Date End Date  Taking? Authorizing Provider  amLODipine (NORVASC) 10 MG tablet Take 10 mg by mouth at bedtime.   Yes Historical Provider, MD  lisinopril-hydrochlorothiazide (PRINZIDE,ZESTORETIC) 20-12.5 MG per tablet Take 2 tablets by mouth daily. 10/14/14  Yes Historical Provider, MD  famotidine (PEPCID) 20 MG tablet Take 1 tablet (20 mg total) by mouth 2 (two) times daily. 12/23/14   Denise Orlene Och, NP  hydrOXYzine (ATARAX/VISTARIL) 25 MG tablet Take 1 tablet (25 mg total) by mouth every 6 (six) hours. 12/23/14   Denise Orlene Och, NP  predniSONE (DELTASONE) 10 MG tablet Take 2 tablets (20 mg total) by mouth 2 (two) times daily with a meal. 12/23/14   Denise Orlene Och, NP   BP 158/112 mmHg  Pulse 90  Temp(Src) 98.6 F (37 C) (Oral)  Resp 20  Ht  (1.575 m)  Wt 246 lb (111.585 kg)  BMI 44.98 kg/m2  SpO2 100%  LMP 12/23/2014 Physical Exam  Constitutional: She is oriented to person, place, and time. No distress.  obese  HENT:  Nose: Nose normal.  Mouth/Throat: Uvula is midline, oropharynx is clear and moist and mucous membranes are normal.  Multiple small Raised red areas to face. No signs of infection.   Eyes: Conjunctivae and EOM are normal.  Neck: Neck supple.  Cardiovascular: Normal rate and regular rhythm.   Elevated BP  Pulmonary/Chest: Effort normal.  Abdominal:  Soft. There is no tenderness.  Musculoskeletal: Normal range of motion.  Neurological: She is alert and oriented to person, place, and time. No cranial nerve deficit.  Skin: Skin is warm and dry. Rash noted.  Psychiatric: She has a normal mood and affect. Her behavior is normal.  Nursing note and vitals reviewed.   ED Course  Procedures  Atarax 25 mg., Pepcid 20 mg, Prednisone 40 mg PO Discussed with the patient in detail elevated BP and need for follow up. She states that she does have medication at home and that she last took it this morning. She states that it is time for her to follow up with her PCP and she will make an appointment.    MDM  34 y.o. female with rash and itching of the face that started yesterday. Will give Rx for atarax and Prednisone and she will follow up with her PCP. She will return here as needed.   Final diagnoses:  Allergic contact dermatitis       Denise Napoleon, NP 12/23/14 2146  Samuel Jester, DO 12/26/14 1610

## 2014-12-23 NOTE — ED Notes (Signed)
Rash on face, onset yesterday, worse today, itching

## 2014-12-31 ENCOUNTER — Encounter (HOSPITAL_COMMUNITY): Payer: Self-pay | Admitting: Emergency Medicine

## 2014-12-31 ENCOUNTER — Emergency Department (HOSPITAL_COMMUNITY)
Admission: EM | Admit: 2014-12-31 | Discharge: 2014-12-31 | Disposition: A | Discharge status: Home / Self Care | Payer: Medicaid Other | Attending: Emergency Medicine | Admitting: Emergency Medicine

## 2014-12-31 DIAGNOSIS — G8929 Other chronic pain: Secondary | ICD-10-CM | POA: Insufficient documentation

## 2014-12-31 DIAGNOSIS — G40909 Epilepsy, unspecified, not intractable, without status epilepticus: Secondary | ICD-10-CM | POA: Insufficient documentation

## 2014-12-31 DIAGNOSIS — Z8709 Personal history of other diseases of the respiratory system: Secondary | ICD-10-CM | POA: Diagnosis not present

## 2014-12-31 DIAGNOSIS — R5383 Other fatigue: Secondary | ICD-10-CM | POA: Insufficient documentation

## 2014-12-31 DIAGNOSIS — I10 Essential (primary) hypertension: Secondary | ICD-10-CM | POA: Insufficient documentation

## 2014-12-31 DIAGNOSIS — Z3202 Encounter for pregnancy test, result negative: Secondary | ICD-10-CM | POA: Diagnosis not present

## 2014-12-31 DIAGNOSIS — R112 Nausea with vomiting, unspecified: Secondary | ICD-10-CM | POA: Insufficient documentation

## 2014-12-31 DIAGNOSIS — Z79899 Other long term (current) drug therapy: Secondary | ICD-10-CM | POA: Diagnosis not present

## 2014-12-31 LAB — COMPREHENSIVE METABOLIC PANEL
ALT: 10 U/L — AB (ref 14–54)
AST: 11 U/L — AB (ref 15–41)
Albumin: 3.4 g/dL — ABNORMAL LOW (ref 3.5–5.0)
Alkaline Phosphatase: 61 U/L (ref 38–126)
Anion gap: 5 (ref 5–15)
BUN: 14 mg/dL (ref 6–20)
CALCIUM: 8.6 mg/dL — AB (ref 8.9–10.3)
CHLORIDE: 99 mmol/L — AB (ref 101–111)
CO2: 34 mmol/L — AB (ref 22–32)
CREATININE: 0.83 mg/dL (ref 0.44–1.00)
GFR calc Af Amer: >60 mL/min (ref >60)
GFR calc non Af Amer: >60 mL/min (ref >60)
GLUCOSE: 88 mg/dL (ref 65–99)
Potassium: 3.2 mmol/L — ABNORMAL LOW (ref 3.5–5.1)
SODIUM: 138 mmol/L (ref 135–145)
Total Bilirubin: 0.7 mg/dL (ref 0.3–1.2)
Total Protein: 7.6 g/dL (ref 6.5–8.1)

## 2014-12-31 LAB — CBC
HCT: 39.5 % (ref 36.0–46.0)
Hemoglobin: 12.5 g/dL (ref 12.0–15.0)
MCH: 28.9 pg (ref 26.0–34.0)
MCHC: 31.6 g/dL (ref 30.0–36.0)
MCV: 91.2 fL (ref 78.0–100.0)
Platelets: 457 10*3/uL — ABNORMAL HIGH (ref 150–400)
RBC: 4.33 MIL/uL (ref 3.87–5.11)
RDW: 14.2 % (ref 11.5–15.5)
WBC: 13.9 10*3/uL — ABNORMAL HIGH (ref 4.0–10.5)

## 2014-12-31 LAB — URINALYSIS, ROUTINE W REFLEX MICROSCOPIC
BILIRUBIN URINE: NEGATIVE
Glucose, UA: NEGATIVE mg/dL
HGB URINE DIPSTICK: NEGATIVE
KETONES UR: NEGATIVE mg/dL
LEUKOCYTES UA: NEGATIVE
Nitrite: NEGATIVE
PROTEIN: NEGATIVE mg/dL
Specific Gravity, Urine: 1.02 (ref 1.005–1.030)
Urobilinogen, UA: 0.2 mg/dL (ref 0.0–1.0)
pH: 6 (ref 5.0–8.0)

## 2014-12-31 LAB — LIPASE, BLOOD: LIPASE: 17 U/L — AB (ref 22–51)

## 2014-12-31 LAB — PREGNANCY, URINE: Preg Test, Ur: NEGATIVE

## 2014-12-31 MED ORDER — ONDANSETRON HCL 4 MG/2ML IJ SOLN
4.0000 mg | Freq: Once | INTRAMUSCULAR | Status: AC
  Administered 2014-12-31: 4 mg via INTRAVENOUS
  Filled 2014-12-31: qty 2

## 2014-12-31 MED ORDER — SODIUM CHLORIDE 0.9 % IV BOLUS (SEPSIS)
1000.0000 mL | Freq: Once | INTRAVENOUS | Status: AC
  Administered 2014-12-31: 1000 mL via INTRAVENOUS

## 2014-12-31 MED ORDER — ONDANSETRON 8 MG PO TBDP: 8.0000 mg | ORAL_TABLET | Freq: Three times a day (TID) | ORAL | Status: DC | PRN

## 2014-12-31 NOTE — Discharge Instructions (Signed)

## 2014-12-31 NOTE — ED Notes (Signed)
PT c/o nausea with vomiting x3 in the past 24 hours but denies any abdominal pain. PT denies any urinary symptoms and normal BM today.

## 2014-12-31 NOTE — ED Notes (Signed)
Gave pt a ginger ale for a fluid challenge 

## 2014-12-31 NOTE — ED Provider Notes (Signed)
CSN: [id number]     Arrival date & time 12/31/14  1244 History   First MD Initiated Contact with Patient 12/31/14 1346     Chief Complaint  Patient presents with  . Nausea     (Consider location/radiation/quality/duration/timing/severity/associated sxs/prior Treatment) The history is provided by the patient.   patient presents with nausea vomiting without diarrhea. No abdominal pain. Has had some chills and fevers. She's had for last couple days. Decreased intake. Has been having normal bowel movements. States she feels somewhat fatigued. Denies possibility of pregnancy. No sick contacts.  Past Medical History  Diagnosis Date  . History of seizure     x 1 around 2010 - unknown cause  . Tonsillar and adenoid hypertrophy 04/2014  . Hypertension     states under control since 11/2013  . Chronic headache    Past Surgical History  Procedure Laterality Date  . Cesarean section  10/05/2003  . Breast reduction surgery    . Cholecystectomy  12/01/2003  . Tubal ligation  01/08/2004  . Tonsillectomy and adenoidectomy Bilateral 05/05/2014    Procedure: BILATERAL TONSILLECTOMY  W/  ABLATATION OF ADENOIDS;  Surgeon: Darletta Moll, MD;  Location: [facility];  Service: ENT;  Laterality: Bilateral;   History reviewed. No pertinent family history. Social History  Substance Use Topics  . Smoking status: Never Smoker   . Smokeless tobacco: Never Used  . Alcohol Use: No   OB History    Gravida Para Term Preterm AB TAB SAB Ectopic Multiple Living            1     Review of Systems  Constitutional: Positive for chills, appetite change and fatigue. Negative for fever and activity change.  Eyes: Negative for pain.  Respiratory: Negative for chest tightness and shortness of breath.   Cardiovascular: Negative for chest pain and leg swelling.  Gastrointestinal: Positive for nausea and vomiting. Negative for abdominal pain and diarrhea.  Genitourinary: Negative for flank pain.   Musculoskeletal: Negative for back pain and neck stiffness.  Skin: Negative for rash.  Neurological: Negative for weakness, numbness and headaches.  Psychiatric/Behavioral: Negative for behavioral problems.      Allergies  Chocolate  Home Medications   Prior to Admission medications   Medication Sig Start Date End Date Taking? Authorizing Provider  amLODipine (NORVASC) 10 MG tablet Take 10 mg by mouth at bedtime.   Yes Historical Provider, MD  famotidine (PEPCID) 20 MG tablet Take 1 tablet (20 mg total) by mouth 2 (two) times daily. 12/23/14  Yes Hope Orlene Och, NP  hydrOXYzine (ATARAX/VISTARIL) 25 MG tablet Take 1 tablet (25 mg total) by mouth every 6 (six) hours. 12/23/14  Yes Hope Orlene Och, NP  lisinopril-hydrochlorothiazide (PRINZIDE,ZESTORETIC) 20-12.5 MG per tablet Take 2 tablets by mouth daily. 10/14/14  Yes Historical Provider, MD  predniSONE (DELTASONE) 10 MG tablet Take 2 tablets (20 mg total) by mouth 2 (two) times daily with a meal. 12/23/14  Yes Hope Orlene Och, NP  ondansetron (ZOFRAN-ODT) 8 MG disintegrating tablet Take 1 tablet (8 mg total) by mouth every 8 (eight) hours as needed for nausea or vomiting. 12/31/14   Benjiman Core, MD   BP 145/111 mmHg  Pulse 83  Temp(Src) 98 F (36.7 C) (Oral)  Resp 20  Ht  (1.575 m)  Wt 246 lb (111.585 kg)  BMI 44.98 kg/m2  SpO2 100%  LMP 12/23/2014 Physical Exam  Constitutional: She appears well-developed.  HENT:  Head: Atraumatic.  Neck: Neck supple.  Cardiovascular: Normal rate.   Abdominal: Soft. There is no tenderness.  Musculoskeletal: Normal range of motion.  Neurological: She is alert.  Skin: Skin is warm.  Psychiatric: She has a normal mood and affect.    ED Course  Procedures (including critical care time) Labs Review Labs Reviewed  LIPASE, BLOOD - Abnormal; Notable for the following:    Lipase 17 (*)    All other components within normal limits  COMPREHENSIVE METABOLIC PANEL - Abnormal; Notable for the  following:    Potassium 3.2 (*)    Chloride 99 (*)    CO2 34 (*)    Calcium 8.6 (*)    Albumin 3.4 (*)    AST 11 (*)    ALT 10 (*)    All other components within normal limits  CBC - Abnormal; Notable for the following:    WBC 13.9 (*)    Platelets 457 (*)    All other components within normal limits  URINALYSIS, ROUTINE W REFLEX MICROSCOPIC (NOT AT [facility]) - Abnormal; Notable for the following:    APPearance HAZY (*)    All other components within normal limits  PREGNANCY, URINE    Imaging Review No results found. I have personally reviewed and evaluated these images and lab results as part of my medical decision-making.   EKG Interpretation None      MDM   Final diagnoses:  Non-intractable vomiting with nausea, vomiting of unspecified type    Nausea and vomiting. Lab work overall reassuring. Feels better after treatments. Mild hypokalemia. Will discharge home.    Benjiman Core, MD 12/31/14 1556

## 2015-11-04 ENCOUNTER — Emergency Department (HOSPITAL_COMMUNITY)
Admission: EM | Admit: 2015-11-04 | Discharge: 2015-11-04 | Disposition: A | Discharge status: Home / Self Care | Payer: Medicaid Other | Attending: Emergency Medicine | Admitting: Emergency Medicine

## 2015-11-04 ENCOUNTER — Encounter (HOSPITAL_COMMUNITY): Payer: Self-pay

## 2015-11-04 ENCOUNTER — Emergency Department (HOSPITAL_COMMUNITY): Payer: Medicaid Other

## 2015-11-04 DIAGNOSIS — J069 Acute upper respiratory infection, unspecified: Secondary | ICD-10-CM | POA: Diagnosis not present

## 2015-11-04 DIAGNOSIS — B09 Unspecified viral infection characterized by skin and mucous membrane lesions: Secondary | ICD-10-CM | POA: Diagnosis not present

## 2015-11-04 DIAGNOSIS — I10 Essential (primary) hypertension: Secondary | ICD-10-CM | POA: Diagnosis not present

## 2015-11-04 DIAGNOSIS — R05 Cough: Secondary | ICD-10-CM | POA: Diagnosis present

## 2015-11-04 LAB — CBC WITH DIFFERENTIAL/PLATELET
BASOS ABS: 0 10*3/uL (ref 0.0–0.1)
BASOS PCT: 0 %
EOS ABS: 0.2 10*3/uL (ref 0.0–0.7)
Eosinophils Relative: 3 %
HEMATOCRIT: 36.4 % (ref 36.0–46.0)
HEMOGLOBIN: 11.5 g/dL — AB (ref 12.0–15.0)
Lymphocytes Relative: 30 %
Lymphs Abs: 1.9 10*3/uL (ref 0.7–4.0)
MCH: 28 pg (ref 26.0–34.0)
MCHC: 31.6 g/dL (ref 30.0–36.0)
MCV: 88.6 fL (ref 78.0–100.0)
Monocytes Absolute: 0.7 10*3/uL (ref 0.1–1.0)
Monocytes Relative: 12 %
NEUTROS PCT: 55 %
Neutro Abs: 3.5 10*3/uL (ref 1.7–7.7)
Platelets: 309 10*3/uL (ref 150–400)
RBC: 4.11 MIL/uL (ref 3.87–5.11)
RDW: 14.6 % (ref 11.5–15.5)
WBC: 6.3 10*3/uL (ref 4.0–10.5)

## 2015-11-04 LAB — BASIC METABOLIC PANEL
ANION GAP: 5 (ref 5–15)
BUN: 8 mg/dL (ref 6–20)
CHLORIDE: 103 mmol/L (ref 101–111)
CO2: 27 mmol/L (ref 22–32)
Calcium: 8 mg/dL — ABNORMAL LOW (ref 8.9–10.3)
Creatinine, Ser: 0.73 mg/dL (ref 0.44–1.00)
GFR calc Af Amer: >60 mL/min (ref >60)
GFR calc non Af Amer: >60 mL/min (ref >60)
Glucose, Bld: 99 mg/dL (ref 65–99)
POTASSIUM: 3.2 mmol/L — AB (ref 3.5–5.1)
SODIUM: 135 mmol/L (ref 135–145)

## 2015-11-04 MED ORDER — PREDNISONE 20 MG PO TABS: 40.0000 mg | ORAL_TABLET | Freq: Every day | ORAL | Status: DC

## 2015-11-04 MED ORDER — DIPHENHYDRAMINE HCL 25 MG PO CAPS
25.0000 mg | ORAL_CAPSULE | Freq: Once | ORAL | Status: AC
  Administered 2015-11-04: 25 mg via ORAL
  Filled 2015-11-04: qty 1

## 2015-11-04 MED ORDER — DIPHENHYDRAMINE HCL 25 MG PO TABS: 25.0000 mg | ORAL_TABLET | Freq: Three times a day (TID) | ORAL | Status: DC | PRN

## 2015-11-04 MED ORDER — AMLODIPINE BESYLATE 10 MG PO TABS: 10.0000 mg | ORAL_TABLET | Freq: Every day | ORAL | Status: DC

## 2015-11-04 MED ORDER — AMLODIPINE BESYLATE 5 MG PO TABS
5.0000 mg | ORAL_TABLET | Freq: Once | ORAL | Status: AC
  Administered 2015-11-04: 5 mg via ORAL
  Filled 2015-11-04: qty 1

## 2015-11-04 MED ORDER — AZITHROMYCIN 250 MG PO TABS
250.0000 mg | ORAL_TABLET | Freq: Every day | ORAL | Status: DC
Start: 2015-11-04 — End: 2016-06-15

## 2015-11-04 MED ORDER — LISINOPRIL-HYDROCHLOROTHIAZIDE 20-12.5 MG PO TABS: 2.0000 | ORAL_TABLET | Freq: Every day | ORAL | Status: DC

## 2015-11-04 NOTE — ED Provider Notes (Signed)
CSN: [id number]     Arrival date & time 11/04/15  0053 History   First MD Initiated Contact with Patient 11/04/15 0201     Chief Complaint  Patient presents with  . Cough  . Rash     (Consider location/radiation/quality/duration/timing/severity/associated sxs/prior Treatment) HPI  This is a 35 year old female who presents with 2 to three-day history of cough, congestion, rash, and low-grade fever. Patient reports that the cough is nonproductive. She reports nasal congestion as well as an itchy rash noted on her arms, neck, and trunk.  She has not taken anything for her symptoms. Denies chest pain or shortness of breath. She denies any new detergents or lotions. No known sick contacts. Denies headache or neck stiffness.  Past Medical History  Diagnosis Date  . History of seizure     x 1 around 2010 - unknown cause  . Tonsillar and adenoid hypertrophy 04/2014  . Hypertension     states under control since 11/2013  . Chronic headache    Past Surgical History  Procedure Laterality Date  . Cesarean section  10/05/2003  . Breast reduction surgery    . Cholecystectomy  12/01/2003  . Tubal ligation  01/08/2004  . Tonsillectomy and adenoidectomy Bilateral 05/05/2014    Procedure: BILATERAL TONSILLECTOMY  W/  ABLATATION OF ADENOIDS;  Surgeon: Darletta MollSui W Teoh, MD;  Location: [facility];  Service: ENT;  Laterality: Bilateral;   No family history on file. Social History  Substance Use Topics  . Smoking status: Never Smoker   . Smokeless tobacco: Never Used  . Alcohol Use: No   OB History    Gravida Para Term Preterm AB TAB SAB Ectopic Multiple Living            1     Review of Systems  HENT: Positive for congestion.   Respiratory: Positive for cough. Negative for shortness of breath.   Cardiovascular: Negative for chest pain.  Gastrointestinal: Negative for nausea and vomiting.  Skin: Positive for rash.  All other systems reviewed and are negative.     Allergies   Chocolate  Home Medications   Prior to Admission medications   Medication Sig Start Date End Date Taking? Authorizing Provider  amLODipine (NORVASC) 10 MG tablet Take 1 tablet (10 mg total) by mouth daily. 11/04/15   Shon Batonourtney F Deijah Spikes, MD  azithromycin (ZITHROMAX) 250 MG tablet Take 1 tablet (250 mg total) by mouth daily. Take first 2 tablets together, then 1 every day until finished. 11/04/15   Shon Batonourtney F Chaeli Judy, MD  diphenhydrAMINE (BENADRYL) 25 MG tablet Take 1 tablet (25 mg total) by mouth every 8 (eight) hours as needed. 11/04/15   Shon Batonourtney F Davis Vannatter, MD  famotidine (PEPCID) 20 MG tablet Take 1 tablet (20 mg total) by mouth 2 (two) times daily. 12/23/14   Hope Orlene OchM Neese, NP  hydrOXYzine (ATARAX/VISTARIL) 25 MG tablet Take 1 tablet (25 mg total) by mouth every 6 (six) hours. 12/23/14   Hope Orlene OchM Neese, NP  lisinopril-hydrochlorothiazide (PRINZIDE,ZESTORETIC) 20-12.5 MG tablet Take 2 tablets by mouth daily. 11/04/15   Shon Batonourtney F Prabhjot Maddux, MD  ondansetron (ZOFRAN-ODT) 8 MG disintegrating tablet Take 1 tablet (8 mg total) by mouth every 8 (eight) hours as needed for nausea or vomiting. 12/31/14   Benjiman CoreNathan Pickering, MD  predniSONE (DELTASONE) 20 MG tablet Take 2 tablets (40 mg total) by mouth daily with breakfast. 11/04/15   Shon Batonourtney F Anye Brose, MD   BP 175/112 mmHg  Pulse 92  Temp(Src) 100.2 F (37.9 C) (  Oral)  Resp 18  Ht  (1.575 m)  Wt 261 lb (118.389 kg)  BMI 47.73 kg/m2  SpO2 99%  LMP 10/25/2015 Physical Exam  Constitutional: She is oriented to person, place, and time. She appears well-developed and well-nourished. No distress.  HENT:  Head: Normocephalic and atraumatic.  Mouth/Throat: Oropharynx is clear and moist.  Eyes: Pupils are equal, round, and reactive to light.  Cardiovascular: Normal rate, regular rhythm and normal heart sounds.   Pulmonary/Chest: Effort normal and breath sounds normal. No respiratory distress. She has no wheezes. She has no rales.  Abdominal: Soft. Bowel sounds  are normal. There is no tenderness. There is no rebound.  Neurological: She is alert and oriented to person, place, and time.  Skin: Skin is warm and dry.  Scattered hives of varying stages and size noted over the bilateral arms, neck, and trunk as well as scantly over the lower extremities  Psychiatric: She has a normal mood and affect.  Nursing note and vitals reviewed.   ED Course  Procedures (including critical care time) Labs Review Labs Reviewed  CBC WITH DIFFERENTIAL/PLATELET - Abnormal; Notable for the following:    Hemoglobin 11.5 (*)    All other components within normal limits  BASIC METABOLIC PANEL - Abnormal; Notable for the following:    Potassium 3.2 (*)    Calcium 8.0 (*)    All other components within normal limits    Imaging Review Dg Chest 2 View  11/04/2015  CLINICAL DATA:  Productive cough, congestion, and rash to the arms and neck for several days. Nonsmoker. History of hypertension. EXAM: CHEST  2 VIEW COMPARISON:  09/05/2011 FINDINGS: Normal heart size and pulmonary vascularity. Peribronchial thickening with perihilar streaky opacities suggesting bronchiolitis or small airways disease. Linear atelectasis in the left mid lung. No airspace consolidation. No blunting of costophrenic angles. No pneumothorax. Mediastinal contours appear intact. IMPRESSION: Peribronchial thickening with perihilar streaky opacities suggesting bronchitis or small airways disease. Electronically Signed   By: Burman Nieves M.D.   On: 11/04/2015 02:32   I have personally reviewed and evaluated these images and lab results as part of my medical decision-making.   EKG Interpretation None      MDM   Final diagnoses:  Upper respiratory infection  Viral exanthem  Essential hypertension    Patient presents with upper respiratory symptoms. Is nontoxic. Temperature of 100.2. Breath sounds are clear. Likely viral upper respiratory infection and viral exanthem. Lab work obtained and  largely reassuring. Chest x-ray shows no evidence of pneumonia but does show streaky opacities suggestive of bronchitis or small airway disease. Given history of fever and cough, patient may also have early pneumonia. For this reason will placed on azithromycin. Patient was discharged with azithromycin, prednisone for bronchitis, and Benadryl for itching. Follow-up in one to 2 days if not improving.  After history, exam, and medical workup I feel the patient has been appropriately medically screened and is safe for discharge home. Pertinent diagnoses were discussed with the patient. Patient was given return precautions.     Shon Baton, MD 11/04/15 [phone]

## 2015-11-04 NOTE — Discharge Instructions (Signed)
Upper Respiratory Infection, Adult °Most upper respiratory infections (URIs) are a viral infection of the air passages leading to the lungs. A URI affects the nose, throat, and upper air passages. The most common type of URI is nasopharyngitis and is typically referred to as "the common cold." °URIs run their course and usually go away on their own. Most of the time, a URI does not require medical attention, but sometimes a bacterial infection in the upper airways can follow a viral infection. This is called a secondary infection. Sinus and middle ear infections are common types of secondary upper respiratory infections. °Bacterial pneumonia can also complicate a URI. A URI can worsen asthma and chronic obstructive pulmonary disease (COPD). Sometimes, these complications can require emergency medical care and may be life threatening.  °CAUSES °Almost all URIs are caused by viruses. A virus is a type of germ and can spread from one person to another.  °RISKS FACTORS °You may be at risk for a URI if:  °· You smoke.   °· You have chronic heart or lung disease. °· You have a weakened defense (immune) system.   °· You are very young or very old.   °· You have nasal allergies or asthma. °· You work in crowded or poorly ventilated areas. °· You work in health care facilities or schools. °SIGNS AND SYMPTOMS  °Symptoms typically develop 2-3 days after you come in contact with a cold virus. Most viral URIs last 7-10 days. However, viral URIs from the influenza virus (flu virus) can last 14-18 days and are typically more severe. Symptoms may include:  °· Runny or stuffy (congested) nose.   °· Sneezing.   °· Cough.   °· Sore throat.   °· Headache.   °· Fatigue.   °· Fever.   °· Loss of appetite.   °· Pain in your forehead, behind your eyes, and over your cheekbones (sinus pain). °· Muscle aches.   °DIAGNOSIS  °Your health care provider may diagnose a URI by: °· Physical exam. °· Tests to check that your symptoms are not due to  another condition such as: °· Strep throat. °· Sinusitis. °· Pneumonia. °· Asthma. °TREATMENT  °A URI goes away on its own with time. It cannot be cured with medicines, but medicines may be prescribed or recommended to relieve symptoms. Medicines may help: °· Reduce your fever. °· Reduce your cough. °· Relieve nasal congestion. °HOME CARE INSTRUCTIONS  °· Take medicines only as directed by your health care provider.   °· Gargle warm saltwater or take cough drops to comfort your throat as directed by your health care provider. °· Use a warm mist humidifier or inhale steam from a shower to increase air moisture. This may make it easier to breathe. °· Drink enough fluid to keep your urine clear or pale yellow.   °· Eat soups and other clear broths and maintain good nutrition.   °· Rest as needed.   °· Return to work when your temperature has returned to normal or as your health care provider advises. You may need to stay home longer to avoid infecting others. You can also use a face mask and careful hand washing to prevent spread of the virus. °· Increase the usage of your inhaler if you have asthma.   °· Do not use any tobacco products, including cigarettes, chewing tobacco, or electronic cigarettes. If you need help quitting, ask your health care provider. °PREVENTION  °The best way to protect yourself from getting a cold is to practice good hygiene.  °· Avoid oral or hand contact with people with cold   symptoms.   °· Wash your hands often if contact occurs.   °There is no clear evidence that vitamin C, vitamin E, echinacea, or exercise reduces the chance of developing a cold. However, it is always recommended to get plenty of rest, exercise, and practice good nutrition.  °SEEK MEDICAL CARE IF:  °· You are getting worse rather than better.   °· Your symptoms are not controlled by medicine.   °· You have chills. °· You have worsening shortness of breath. °· You have brown or red mucus. °· You have yellow or brown nasal  discharge. °· You have pain in your face, especially when you bend forward. °· You have a fever. °· You have swollen neck glands. °· You have pain while swallowing. °· You have white areas in the back of your throat. °SEEK IMMEDIATE MEDICAL CARE IF:  °· You have severe or persistent: °· Headache. °· Ear pain. °· Sinus pain. °· Chest pain. °· You have chronic lung disease and any of the following: °· Wheezing. °· Prolonged cough. °· Coughing up blood. °· A change in your usual mucus. °· You have a stiff neck. °· You have changes in your: °· Vision. °· Hearing. °· Thinking. °· Mood. °MAKE SURE YOU:  °· Understand these instructions. °· Will watch your condition. °· Will get help right away if you are not doing well or get worse. °  °This information is not intended to replace advice given to you by your health care provider. Make sure you discuss any questions you have with your health care provider. °  °Document Released: 10/04/2000 Document Revised: 08/25/2014 Document Reviewed: 07/16/2013 °Elsevier Interactive Patient Education ©2016 [employer] °Hypertension °Hypertension, commonly called high blood pressure, is when the force of blood pumping through your arteries is too strong. Your arteries are the blood vessels that carry blood from your heart throughout your body. A blood pressure reading consists of a higher number over a lower number, such as 110/72. The higher number (systolic) is the pressure inside your arteries when your heart pumps. The lower number (diastolic) is the pressure inside your arteries when your heart relaxes. Ideally you want your blood pressure below 120/80. °Hypertension forces your heart to work harder to pump blood. Your arteries may become narrow or stiff. Having untreated or uncontrolled hypertension can cause heart attack, stroke, kidney disease, and other problems. °RISK FACTORS °Some risk factors for high blood pressure are controllable. Others are not.  °Risk factors you cannot  control include:  °· Race. You may be at higher risk if you are African American. °· Age. Risk increases with age. °· Gender. Men are at higher risk than women before age 45 years. After age 65, women are at higher risk than men. °Risk factors you can control include: °· Not getting enough exercise or physical activity. °· Being overweight. °· Getting too much fat, sugar, calories, or salt in your diet. °· Drinking too much alcohol. °SIGNS AND SYMPTOMS °Hypertension does not usually cause signs or symptoms. Extremely high blood pressure (hypertensive crisis) may cause headache, anxiety, shortness of breath, and nosebleed. °DIAGNOSIS °To check if you have hypertension, your health care provider will measure your blood pressure while you are seated, with your arm held at the level of your heart. It should be measured at least twice using the same arm. Certain conditions can cause a difference in blood pressure between your right and left arms. A blood pressure reading that is higher than normal on one occasion does not mean that   you need treatment. If it is not clear whether you have high blood pressure, you may be asked to return on a different day to have your blood pressure checked again. Or, you may be asked to monitor your blood pressure at home for 1 or more weeks. °TREATMENT °Treating high blood pressure includes making lifestyle changes and possibly taking medicine. Living a healthy lifestyle can help lower high blood pressure. You may need to change some of your habits. °Lifestyle changes may include: °· Following the DASH diet. This diet is high in fruits, vegetables, and whole grains. It is low in salt, red meat, and added sugars. °· Keep your sodium intake below 2,300 mg per day. °· Getting at least 30-45 minutes of aerobic exercise at least 4 times per week. °· Losing weight if necessary. °· Not smoking. °· Limiting alcoholic beverages. °· Learning ways to reduce stress. °Your health care provider may  prescribe medicine if lifestyle changes are not enough to get your blood pressure under control, and if one of the following is true: °· You are 18-59 years of age and your systolic blood pressure is above 140. °· You are 60 years of age or older, and your systolic blood pressure is above 150. °· Your diastolic blood pressure is above 90. °· You have diabetes, and your systolic blood pressure is over 140 or your diastolic blood pressure is over 90. °· You have kidney disease and your blood pressure is above 140/90. °· You have heart disease and your blood pressure is above 140/90. °Your personal target blood pressure may vary depending on your medical conditions, your age, and other factors. °HOME CARE INSTRUCTIONS °· Have your blood pressure rechecked as directed by your health care provider.   °· Take medicines only as directed by your health care provider. Follow the directions carefully. Blood pressure medicines must be taken as prescribed. The medicine does not work as well when you skip doses. Skipping doses also puts you at risk for problems. °· Do not smoke.   °· Monitor your blood pressure at home as directed by your health care provider.  °SEEK MEDICAL CARE IF:  °· You think you are having a reaction to medicines taken. °· You have recurrent headaches or feel dizzy. °· You have swelling in your ankles. °· You have trouble with your vision. °SEEK IMMEDIATE MEDICAL CARE IF: °· You develop a severe headache or confusion. °· You have unusual weakness, numbness, or feel faint. °· You have severe chest or abdominal pain. °· You vomit repeatedly. °· You have trouble breathing. °MAKE SURE YOU:  °· Understand these instructions. °· Will watch your condition. °· Will get help right away if you are not doing well or get worse. °  °This information is not intended to replace advice given to you by your health care provider. Make sure you discuss any questions you have with your health care provider. °  °Document  Released: 04/10/2005 Document Revised: 08/25/2014 Document Reviewed: 01/31/2013 °Elsevier Interactive Patient Education ©2016 [employer] Inc[employer] ° °

## 2015-11-04 NOTE — ED Notes (Signed)
Pt c/o cough, congestion, also has several itchy bumps/rash to arms and neck.

## 2016-05-22 ENCOUNTER — Encounter: Payer: Self-pay | Admitting: Gastroenterology

## 2016-06-15 ENCOUNTER — Encounter: Payer: Self-pay | Admitting: Gastroenterology

## 2016-06-15 ENCOUNTER — Other Ambulatory Visit: Payer: Self-pay

## 2016-06-15 ENCOUNTER — Ambulatory Visit (INDEPENDENT_AMBULATORY_CARE_PROVIDER_SITE_OTHER): Payer: Medicaid Other | Admitting: Gastroenterology

## 2016-06-15 VITALS — BP 158/108 | HR 111 | Temp 97.8°F | Ht 62.0 in | Wt 272.8 lb

## 2016-06-15 DIAGNOSIS — K5900 Constipation, unspecified: Secondary | ICD-10-CM | POA: Insufficient documentation

## 2016-06-15 DIAGNOSIS — K625 Hemorrhage of anus and rectum: Secondary | ICD-10-CM | POA: Diagnosis not present

## 2016-06-15 DIAGNOSIS — Z800 Family history of malignant neoplasm of digestive organs: Secondary | ICD-10-CM

## 2016-06-15 MED ORDER — SOD PICOSULFATE-MAG OX-CIT ACD 10-3.5-12 MG-GM-GM PO PACK: 1.0000 | PACK | ORAL | 0 refills | Status: DC

## 2016-06-15 NOTE — Patient Instructions (Signed)
I have given you a sample called Linzess to take once each morning, 30 minutes before breakfast. This is for constipation. IF you have loose stool for more than 4 days, let me know. It should get better though and not persist (linzess can cause loose stool).  We have scheduled you for a colonoscopy with Dr. Darrick PennaFields.

## 2016-06-15 NOTE — Progress Notes (Addendum)
REVIEWED-NO ADDITIONAL RECOMMENDATIONS.  Primary Care Physician:  Sharlene Dory, NP Primary Gastroenterologist:  Dr. Darrick Penna   Chief Complaint  Patient presents with  . Abdominal Pain    HPI:   Denise Walters is a 36 y.o. female presenting today at the request of her PCP secondary to abdominal pain. She has a family history of colon cancer, with her mother diagnosed at age 106.   She tells me she has abdominal pain LLQ that has been present for several weeks, worse at night instead of the day. Has to switch sides to ease the discomfort and "presses" down to make it feel better. No fever/chills. Has some days of constipation, some days of loose stool. Wonders if her weight is causing issues with abdominal discomfort. Has seen paper hematochezia with wiping at times. No N/V, no upper GI symptoms. Sometimes has to force herself to have a BM. Strains. Difficulty sleeping and headaches noted recently. Curious about weight loss surgery, diet programs, etc. Continues to talk about her weight and wants to be present for her daughter, who is about 77. Wants to be able to run and play with her daughter, but her weight makes it difficult. Transportation is an issue, and she does not have a license. She tells me her sister was killed by a car one day in front of her, and this is why she does not want to drive. She has a desire to pursue healthy lifestyle, but she is unsure how to do it.   Past Medical History:  Diagnosis Date  . Chronic headache   . History of seizure    x 1 around 2010 - unknown cause  . Hypertension    states under control since 11/2013  . Tonsillar and adenoid hypertrophy 04/2014    Past Surgical History:  Procedure Laterality Date  . BREAST REDUCTION SURGERY    . CESAREAN SECTION  10/05/2003  . CHOLECYSTECTOMY  12/01/2003  . TONSILLECTOMY AND ADENOIDECTOMY Bilateral 05/05/2014   Procedure: BILATERAL TONSILLECTOMY  W/  ABLATATION OF ADENOIDS;  Surgeon: Darletta Moll, MD;   Location: [facility];  Service: ENT;  Laterality: Bilateral;  . TUBAL LIGATION  01/08/2004    Current Outpatient Prescriptions  Medication Sig Dispense Refill  . amLODipine (NORVASC) 10 MG tablet Take 1 tablet (10 mg total) by mouth daily. 15 tablet 0  . lisinopril-hydrochlorothiazide (PRINZIDE,ZESTORETIC) 20-12.5 MG tablet Take 2 tablets by mouth daily. (Patient taking differently: Take 1 tablet by mouth daily. ) 30 tablet 0  . Sod Picosulfate-Mag Ox-Cit Acd (PREPOPIK) 10-3.5-12 MG-GM-GM PACK Take 1 Container by mouth as directed. 1 each 0   No current facility-administered medications for this visit.     Allergies as of 06/15/2016 - Review Complete 06/15/2016  Allergen Reaction Noted  . Chocolate Hives 04/24/2011    Family History  Problem Relation Age of Onset  . Colon cancer Mother     Social History   Social History  . Marital status: Single    Spouse name: N/A  . Number of children: N/A  . Years of education: N/A   Occupational History  . Not on file.   Social History Main Topics  . Smoking status: Never Smoker  . Smokeless tobacco: Never Used  . Alcohol use No  . Drug use: No  . Sexual activity: Yes    Birth control/ protection: Surgical   Other Topics Concern  . Not on file   Social History Narrative  . No narrative on file  Review of Systems: Gen: Denies any fever, chills, fatigue, weight loss, lack of appetite.  CV: Denies chest pain, heart palpitations, peripheral edema, syncope.  Resp: +DOE GI: see HPI  GU : Denies urinary burning, urinary frequency, urinary hesitancy MS: Denies joint pain, muscle weakness, cramps, or limitation of movement.  Derm: Denies rash, itching, dry skin Psych: Denies depression, anxiety, memory loss, and confusion Heme: see HPI   Physical Exam: BP (!) 158/108 (BP Location: Left Arm, Cuff Size: Large)   Pulse (!) 111   Temp 97.8 F (36.6 C) (Oral)   Ht 5\' 2"  (1.575 m)   Wt 272 lb 12.8 oz (123.7 kg)    LMP 06/11/2016   BMI 49.90 kg/m  General:   Alert and oriented. Pleasant and cooperative. Well-nourished and well-developed.  Head:  Normocephalic and atraumatic. Eyes:  Without icterus, sclera clear and conjunctiva pink.  Ears:  Normal auditory acuity. Nose:  No deformity, discharge,  or lesions. Lungs:  Clear to auscultation bilaterally. No wheezes, rales, or rhonchi. No distress.  Heart:  S1, S2 present without murmurs appreciated.  Abdomen:  +BS, soft, very mild TTP LLQ and non-distended. No HSM noted. No guarding or rebound. No masses appreciated.  Rectal:  Deferred  Msk:  Symmetrical without gross deformities. Normal posture. Extremities:  Without  edema. Neurologic:  Alert and  oriented x4;  grossly normal neurologically. Psych:  Alert and cooperative. Normal mood and affect.

## 2016-06-20 ENCOUNTER — Encounter: Payer: Self-pay | Admitting: Gastroenterology

## 2016-06-20 NOTE — Assessment & Plan Note (Signed)
Mother at age 36. Colonoscopy as planned.

## 2016-06-20 NOTE — Assessment & Plan Note (Signed)
With LLQ discomfort. No concerning findings on physical exam. No need for CT, no alarm features. Likely related to constipation. Trial Linzess 72 mcg once daily.  As of note, she is quite concerned about her weight and desires a healthier lifestyle. Discussed at length nutrition apps, tracking food, and even weight loss surgery. I offered to arrange evaluations with a nutritionist or [facility], but she would like to hold off right now. Discussed using [employer] for now, as this is a free app that she can track food.

## 2016-06-20 NOTE — Assessment & Plan Note (Signed)
36 year old female with rectal bleeding in the setting of straining and constipation. +FH of colon cancer, as mom was diagnosed in her early 5950s. No prior colonoscopy. Will address constipation and also proceed with colonoscopy in near future.  Proceed with colonoscopy with Dr. Darrick PennaFields in the near future. The risks, benefits, and alternatives have been discussed in detail with the patient. They state understanding and desire to proceed.  Phenergan 25 mg IV on call

## 2016-06-21 NOTE — Progress Notes (Signed)
CC'ED TO PCP 

## 2016-06-28 ENCOUNTER — Encounter (HOSPITAL_COMMUNITY): Payer: Self-pay | Admitting: *Deleted

## 2016-06-28 ENCOUNTER — Ambulatory Visit (HOSPITAL_COMMUNITY)
Admission: RE | Admit: 2016-06-28 | Discharge: 2016-06-28 | Disposition: A | Discharge status: Home / Self Care | Payer: Medicaid Other | Source: Ambulatory Visit | Attending: Gastroenterology | Admitting: Gastroenterology

## 2016-06-28 ENCOUNTER — Encounter (HOSPITAL_COMMUNITY)
Admission: RE | Disposition: A | Discharge status: Home / Self Care | Payer: Self-pay | Source: Ambulatory Visit | Attending: Gastroenterology

## 2016-06-28 DIAGNOSIS — K5730 Diverticulosis of large intestine without perforation or abscess without bleeding: Secondary | ICD-10-CM | POA: Insufficient documentation

## 2016-06-28 DIAGNOSIS — K648 Other hemorrhoids: Secondary | ICD-10-CM | POA: Insufficient documentation

## 2016-06-28 DIAGNOSIS — K921 Melena: Secondary | ICD-10-CM

## 2016-06-28 DIAGNOSIS — I10 Essential (primary) hypertension: Secondary | ICD-10-CM | POA: Diagnosis not present

## 2016-06-28 DIAGNOSIS — K625 Hemorrhage of anus and rectum: Secondary | ICD-10-CM

## 2016-06-28 DIAGNOSIS — Z800 Family history of malignant neoplasm of digestive organs: Secondary | ICD-10-CM

## 2016-06-28 HISTORY — PX: COLONOSCOPY: SHX5424

## 2016-06-28 SURGERY — COLONOSCOPY
Anesthesia: Moderate Sedation

## 2016-06-28 MED ORDER — MIDAZOLAM HCL 5 MG/5ML IJ SOLN
INTRAMUSCULAR | Status: AC
  Filled 2016-06-28: qty 10

## 2016-06-28 MED ORDER — MEPERIDINE HCL 100 MG/ML IJ SOLN
INTRAMUSCULAR | Status: AC
  Filled 2016-06-28: qty 2

## 2016-06-28 MED ORDER — SODIUM CHLORIDE 0.9% FLUSH
INTRAVENOUS | Status: AC
  Filled 2016-06-28: qty 10

## 2016-06-28 MED ORDER — MIDAZOLAM HCL 5 MG/5ML IJ SOLN
INTRAMUSCULAR | Status: DC | PRN
  Administered 2016-06-28: 2 mg via INTRAVENOUS
  Administered 2016-06-28: 1 mg via INTRAVENOUS
  Administered 2016-06-28: 2 mg via INTRAVENOUS

## 2016-06-28 MED ORDER — MEPERIDINE HCL 100 MG/ML IJ SOLN
INTRAMUSCULAR | Status: DC | PRN
  Administered 2016-06-28 (×3): 25 mg via INTRAVENOUS

## 2016-06-28 MED ORDER — SODIUM CHLORIDE 0.9 % IV SOLN
INTRAVENOUS | Status: DC
  Administered 2016-06-28: 1000 mL via INTRAVENOUS

## 2016-06-28 MED ORDER — PROMETHAZINE HCL 25 MG/ML IJ SOLN
INTRAMUSCULAR | Status: AC
  Filled 2016-06-28: qty 1

## 2016-06-28 MED ORDER — PROMETHAZINE HCL 25 MG/ML IJ SOLN
25.0000 mg | Freq: Once | INTRAMUSCULAR | Status: AC
  Administered 2016-06-28: 25 mg via INTRAVENOUS

## 2016-06-28 NOTE — Op Note (Signed)
[facility], [place] Patient Name: Denise Walters Procedure Date: 06/28/2016 10:15 AM MRN: [id number] Date of Birth: 1980-08-01 Attending MD: Jonette Eva , MD CSN: [id number] Age: 36 Admit Type: Outpatient Procedure:                Colonoscopy, DIAGNOSTIC Indications:              Hematochezia Providers:                Jonette Eva, MD, Nena Polio, RN, Tammy Vaught, RN Referring MD:              Medicines:                Meperidine 75 mg IV, Midazolam 5 mg IV Complications:            No immediate complications. Estimated Blood Loss:     Estimated blood loss: none. Procedure:                Pre-Anesthesia Assessment:                           - Prior to the procedure, a History and Physical                            was performed, and patient medications and                            allergies were reviewed. The patient's tolerance of                            previous anesthesia was also reviewed. The risks                            and benefits of the procedure and the sedation                            options and risks were discussed with the patient.                            All questions were answered, and informed consent                            was obtained. Prior Anticoagulants: The patient has                            taken no previous anticoagulant or antiplatelet                            agents. ASA Grade Assessment: II - A patient with                            mild systemic disease. After reviewing the risks                            and benefits, the patient was deemed in  satisfactory condition to undergo the procedure.                            After obtaining informed consent, the colonoscope                            was passed under direct vision. Throughout the                            procedure, the patient's blood pressure, pulse, and                            oxygen saturations were monitored continuously. The                     EC-[id number] ([id number]) scope was introduced through                            the anus and advanced to the 10 cm into the ileum.                            The colonoscopy was somewhat difficult due to a                            tortuous colon. Successful completion of the                            procedure was aided by COLOWRAP. The patient                            tolerated the procedure fairly well. The quality of                            the bowel preparation was excellent. The terminal                            ileum, ileocecal valve, appendiceal orifice, and                            rectum were photographed. Scope In: 11:02:24 AM Scope Out: 11:15:49 AM Scope Withdrawal Time: 0 hours 11 minutes 7 seconds  Total Procedure Duration: 0 hours 13 minutes 25 seconds  Findings:      The terminal ileum appeared normal.      A single small-mouthed diverticulum was found in the mid transverse       colon.      The recto-sigmoid colon and sigmoid colon were moderately redundant.      Non-bleeding external and internal hemorrhoids were found during       retroflexion. Impression:               - Diverticulosis in the mid transverse colon.                           - Redundant colon.                           -  RECTAL BLEEDING DUE TO internal hemorrhoids. Moderate Sedation:      Moderate (conscious) sedation was administered by the endoscopy nurse       and supervised by the endoscopist. The following parameters were       monitored: oxygen saturation, heart rate, blood pressure, and response       to care. Total physician intraservice time was 26 minutes. Recommendation:           - High fiber diet. LOSE WEIGHT.                           - Continue present medications. CONTINUE LINZESS.                           - Repeat colonoscopy in 5 years for surveillance.                           - Return to GI office in 6 months.                           - Patient has a  contact number available for                            emergencies. The signs and symptoms of potential                            delayed complications were discussed with the                            patient. Return to normal activities tomorrow.                            Written discharge instructions were provided to the                            patient. Procedure Code(s):        --- Professional ---                           [id number], Colonoscopy, flexible; diagnostic, including                            collection of specimen(s) by brushing or washing,                            when performed (separate procedure)                           99152, Moderate sedation services provided by the                            same physician or other qualified health care                            professional performing the diagnostic or  therapeutic service that the sedation supports,                            requiring the presence of an independent trained                            observer to assist in the monitoring of the                            patient's level of consciousness and physiological                            status; initial 15 minutes of intraservice time,                            patient age 62 years or older                           [phone], Moderate sedation services; each additional                            15 minutes intraservice time Diagnosis Code(s):        --- Professional ---                           K64.8, Other hemorrhoids                           K92.1, Melena (includes Hematochezia)                           K57.30, Diverticulosis of large intestine without                            perforation or abscess without bleeding                           Q43.8, Other specified congenital malformations of                            intestine CPT copyright 2016 American Medical Association. All rights reserved. The codes  documented in this report are preliminary and upon coder review may  be revised to meet current compliance requirements. Jonette Eva, MD Jonette Eva, MD 06/28/2016 11:31:13 AM This report has been signed electronically. Number of Addenda: 0

## 2016-06-28 NOTE — Discharge Instructions (Signed)
You have internal hemorrhoids, which is why YOU SEE BLOOD IN YOUR STIOOL. YOU DID NOT HAVE ANY POLYPS.   AVOID CONSTIPATION. CONTINUE LINZESS.  DRINK WATER TO KEEP YOUR URINE LIGHT YELLOW.  CONTINUE YOUR WEIGHT LOSS EFFORTS. LOSE 20-30 LBS IN THE NEXT YEAR.  FOLLOW A HIGH FIBER DIET. AVOID ITEMS THAT CAUSE BLOATING. SEE INFO BELOW.  USE PREPARATION H FOUR TIMES  A DAY IF NEEDED TO RELIEVE RECTAL PAIN/PRESSURE/BLEEDING.  Next colonoscopy in 5 years. Colonoscopy Care After Read the instructions outlined below and refer to this sheet in the next week. These discharge instructions provide you with general information on caring for yourself after you leave the hospital. While your treatment has been planned according to the most current medical practices available, unavoidable complications occasionally occur. If you have any problems or questions after discharge, call DR. Silas Walters, [phone].  ACTIVITY  You may resume your regular activity, but move at a slower pace for the next 24 hours.   Take frequent rest periods for the next 24 hours.   Walking will help get rid of the air and reduce the bloated feeling in your belly (abdomen).   No driving for 24 hours (because of the medicine (anesthesia) used during the test).   You may shower.   Do not sign any important legal documents or operate any machinery for 24 hours (because of the anesthesia used during the test).    NUTRITION  Drink plenty of fluids.   You may resume your normal diet as instructed by your doctor.   Begin with a light meal and progress to your normal diet. Heavy or fried foods are harder to digest and may make you feel sick to your stomach (nauseated).   Avoid alcoholic beverages for 24 hours or as instructed.    MEDICATIONS  You may resume your normal medications.   WHAT YOU CAN EXPECT TODAY  Some feelings of bloating in the abdomen.   Passage of more gas than usual.   Spotting of blood in your  stool or on the toilet paper  .  IF YOU HAD POLYPS REMOVED DURING THE COLONOSCOPY:  Eat a soft diet IF YOU HAVE NAUSEA, BLOATING, ABDOMINAL PAIN, OR VOMITING.    FINDING OUT THE RESULTS OF YOUR TEST Not all test results are available during your visit. DR. Darrick Walters WILL CALL YOU WITHIN 7 DAYS OF YOUR PROCEDUE WITH YOUR RESULTS. Do not assume everything is normal if you have not heard from DR. Charie Walters IN ONE WEEK, CALL HER OFFICE AT [phone].  SEEK IMMEDIATE MEDICAL ATTENTION AND CALL THE OFFICE: [phone] IF:  You have more than a spotting of blood in your stool.   Your belly is swollen (abdominal distention).   You are nauseated or vomiting.   You have a temperature over 101F.   You have abdominal pain or discomfort that is severe or gets worse throughout the day.  High-Fiber Diet A high-fiber diet changes your normal diet to include more whole grains, legumes, fruits, and vegetables. Changes in the diet involve replacing refined carbohydrates with unrefined foods. The calorie level of the diet is essentially unchanged. The Dietary Reference Intake (recommended amount) for adult males is 38 grams per day. For adult females, it is 25 grams per day. Pregnant and lactating women should consume 28 grams of fiber per day. Fiber is the intact part of a plant that is not broken down during digestion. Functional fiber is fiber that has been isolated from the plant to provide a  beneficial effect in the body. PURPOSE  Increase stool bulk.   Ease and regulate bowel movements.   Lower cholesterol.   REDUCE RISK OF COLON CANCER  INDICATIONS THAT YOU NEED MORE FIBER  Constipation and hemorrhoids.   Uncomplicated diverticulosis (intestine condition) and irritable bowel syndrome.   Weight management.   As a protective measure against hardening of the arteries (atherosclerosis), diabetes, and cancer.   GUIDELINES FOR INCREASING FIBER IN THE DIET  Start adding fiber to the diet  slowly. A gradual increase of about 5 more grams (2 slices of whole-wheat bread, 2 servings of most fruits or vegetables, or 1 bowl of high-fiber cereal) per day is best. Too rapid an increase in fiber may result in constipation, flatulence, and bloating.   Drink enough water and fluids to keep your urine clear or pale yellow. Water, juice, or caffeine-free drinks are recommended. Not drinking enough fluid may cause constipation.   Eat a variety of high-fiber foods rather than one type of fiber.   Try to increase your intake of fiber through using high-fiber foods rather than fiber pills or supplements that contain small amounts of fiber.   The goal is to change the types of food eaten. Do not supplement your present diet with high-fiber foods, but replace foods in your present diet.   INCLUDE A VARIETY OF FIBER SOURCES  Replace refined and processed grains with whole grains, canned fruits with fresh fruits, and incorporate other fiber sources. White rice, white breads, and most bakery goods contain little or no fiber.   Brown whole-grain rice, buckwheat oats, and many fruits and vegetables are all good sources of fiber. These include: broccoli, Brussels sprouts, cabbage, cauliflower, beets, sweet potatoes, white potatoes (skin on), carrots, tomatoes, eggplant, squash, berries, fresh fruits, and dried fruits.   Cereals appear to be the richest source of fiber. Cereal fiber is found in whole grains and bran. Bran is the fiber-rich outer coat of cereal grain, which is largely removed in refining. In whole-grain cereals, the bran remains. In breakfast cereals, the largest amount of fiber is found in those with "bran" in their names. The fiber content is sometimes indicated on the label.   You may need to include additional fruits and vegetables each day.   In baking, for 1 cup white flour, you may use the following substitutions:   1 cup whole-wheat flour minus 2 tablespoons.   1/2 cup white  flour plus 1/2 cup whole-wheat flour.   Hemorrhoids Hemorrhoids are dilated (enlarged) veins around the rectum. Sometimes clots will form in the veins. This makes them swollen and painful. These are called thrombosed hemorrhoids. Causes of hemorrhoids include:  Constipation.   Straining to have a bowel movement.   HEAVY LIFTING  HOME CARE INSTRUCTIONS  Eat a well balanced diet and drink 6 to 8 glasses of water every day to avoid constipation. You may also use a bulk laxative.   Avoid straining to have bowel movements.   Keep anal area dry and clean.   Do not use a donut shaped pillow or sit on the toilet for long periods. This increases blood pooling and pain.   Move your bowels when your body has the urge; this will require less straining and will decrease pain and pressure.

## 2016-06-28 NOTE — H&P (Signed)
  Primary Care Physician:  Sharlene DoryJOELIN, VINEETHA, NP Primary Gastroenterologist:  Dr. Darrick PennaFields  Pre-Procedure History & Physical: HPI:  Denise Walters is a 36 y.o. female here for  BRBPR.  Past Medical History:  Diagnosis Date  . Chronic headache   . History of seizure    x 1 around 2010 - unknown cause  . Hypertension    states under control since 11/2013  . Tonsillar and adenoid hypertrophy 04/2014    Past Surgical History:  Procedure Laterality Date  . BREAST REDUCTION SURGERY    . CESAREAN SECTION  10/05/2003  . CHOLECYSTECTOMY  12/01/2003  . TONSILLECTOMY AND ADENOIDECTOMY Bilateral 05/05/2014   Procedure: BILATERAL TONSILLECTOMY  W/  ABLATATION OF ADENOIDS;  Surgeon: Darletta MollSui W Teoh, MD;  Location: [facility];  Service: ENT;  Laterality: Bilateral;  . TUBAL LIGATION  01/08/2004    Prior to Admission medications   Medication Sig Start Date End Date Taking? Authorizing Provider  amLODipine (NORVASC) 10 MG tablet Take 1 tablet (10 mg total) by mouth daily. 11/04/15  Yes Shon Batonourtney F Horton, MD  lisinopril-hydrochlorothiazide (PRINZIDE,ZESTORETIC) 20-12.5 MG tablet Take 2 tablets by mouth daily. Patient taking differently: Take 1 tablet by mouth daily.  11/04/15  Yes Shon Batonourtney F Horton, MD  Sod Picosulfate-Mag Ox-Cit Acd (PREPOPIK) 10-3.5-12 MG-GM-GM PACK Take 1 Container by mouth as directed. 06/15/16  Yes West BaliSandi L Hiep Ollis, MD    Allergies as of 06/15/2016 - Review Complete 06/15/2016  Allergen Reaction Noted  . Chocolate Hives 04/24/2011    Family History  Problem Relation Age of Onset  . Colon cancer Mother   . Heart attack Father   . Hypertension Brother     Social History   Social History  . Marital status: Single    Spouse name: N/A  . Number of children: N/A  . Years of education: N/A   Occupational History  . Not on file.   Social History Main Topics  . Smoking status: Never Smoker  . Smokeless tobacco: Never Used  . Alcohol use No  . Drug use: No  .  Sexual activity: Yes    Birth control/ protection: Surgical   Other Topics Concern  . Not on file   Social History Narrative  . No narrative on file    Review of Systems: See HPI, otherwise negative ROS   Physical Exam: BP 128/80   Pulse (!) 106   Temp 97.6 F (36.4 C) (Oral)   Resp 16   Ht 5\' 2"  (1.575 m)   Wt 268 lb (121.6 kg)   LMP 06/23/2016   SpO2 98%   BMI 49.02 kg/m  General:   Alert,  pleasant and cooperative in NAD Head:  Normocephalic and atraumatic. Neck:  Supple; Lungs:  Clear throughout to auscultation.    Heart:  Regular rate and rhythm. Abdomen:  Soft, nontender and nondistended. Normal bowel sounds, without guarding, and without rebound.   Neurologic:  Alert and  oriented x4;  grossly normal neurologically.  Impression/Plan:   BRBPR  PLAN: TCS TODAY. DISCUSSED PROCEDURE, BENEFITS, & RISKS: < 1% chance of medication reaction, bleeding, perforation, or rupture of spleen/liver.

## 2016-06-29 ENCOUNTER — Encounter (HOSPITAL_COMMUNITY): Payer: Self-pay | Admitting: Gastroenterology

## 2016-12-04 ENCOUNTER — Encounter: Payer: Self-pay | Admitting: Gastroenterology

## 2017-02-03 ENCOUNTER — Emergency Department (HOSPITAL_COMMUNITY)
Admission: EM | Admit: 2017-02-03 | Discharge: 2017-02-03 | Disposition: A | Discharge status: Home / Self Care | Payer: Medicaid Other | Attending: Emergency Medicine | Admitting: Emergency Medicine

## 2017-02-03 ENCOUNTER — Encounter (HOSPITAL_COMMUNITY): Payer: Self-pay | Admitting: Adult Health

## 2017-02-03 DIAGNOSIS — L509 Urticaria, unspecified: Secondary | ICD-10-CM | POA: Insufficient documentation

## 2017-02-03 DIAGNOSIS — I10 Essential (primary) hypertension: Secondary | ICD-10-CM | POA: Diagnosis not present

## 2017-02-03 DIAGNOSIS — T7840XA Allergy, unspecified, initial encounter: Secondary | ICD-10-CM

## 2017-02-03 DIAGNOSIS — Z79899 Other long term (current) drug therapy: Secondary | ICD-10-CM | POA: Diagnosis not present

## 2017-02-03 MED ORDER — DEXAMETHASONE SODIUM PHOSPHATE 4 MG/ML IJ SOLN
8.0000 mg | Freq: Once | INTRAMUSCULAR | Status: AC
  Administered 2017-02-03: 8 mg via INTRAMUSCULAR
  Filled 2017-02-03: qty 2

## 2017-02-03 MED ORDER — FAMOTIDINE 20 MG PO TABS: 20.0000 mg | ORAL_TABLET | Freq: Two times a day (BID) | ORAL | 0 refills | Status: DC

## 2017-02-03 MED ORDER — FAMOTIDINE 20 MG PO TABS
40.0000 mg | ORAL_TABLET | Freq: Once | ORAL | Status: AC
  Administered 2017-02-03: 40 mg via ORAL
  Filled 2017-02-03: qty 2

## 2017-02-03 NOTE — ED Triage Notes (Signed)
PResents with urticaria and pruritis that began yesterday after getting stung or bitten by something while sitting on her porch. She took a benadryl at 9:30 this am. Hives to bilateral arms and on flank. Airway intact, no wheezing or SOB.

## 2017-02-03 NOTE — Discharge Instructions (Signed)
You were given an injection of steroids here. Recommend Benadryl 3 times a day. Also prescription for Pepcid which has antihistamine effect.  You can also put hydrocortisone 1% cream on the itchy areas. This is over-the-counter.

## 2017-02-03 NOTE — ED Provider Notes (Signed)
[facility] Provider Note   CSN: [id number] Arrival date & time: 02/03/17  1201     History   Chief Complaint Chief Complaint  Patient presents with  . Allergic Reaction    HPI Denise Walters is a 36 y.o. female.  Cc:  Urticaria and pruritus for the past 24 hours.  Patient's thinks she was bitten or stung by an insect on her porch. She has tried Benadryl with minimal relief. She states hives on her arms and flank area. No respiratory distress. No trouble swallowing. Severity of symptoms is mild to moderate.      Past Medical History:  Diagnosis Date  . Chronic headache   . History of seizure    x 1 around 2010 - unknown cause  . Hypertension    states under control since 11/2013  . Tonsillar and adenoid hypertrophy 04/2014    Patient Active Problem List   Diagnosis Date Noted  . Rectal bleeding 06/15/2016  . FH: colon cancer 06/15/2016  . Constipation 06/15/2016    Past Surgical History:  Procedure Laterality Date  . BREAST REDUCTION SURGERY    . CESAREAN SECTION  10/05/2003  . CHOLECYSTECTOMY  12/01/2003  . COLONOSCOPY N/A 06/28/2016   Procedure: COLONOSCOPY;  Surgeon: West Bali, MD;  Location: [facility];  Service: Endoscopy;  Laterality: N/A;  10:30am  . TONSILLECTOMY AND ADENOIDECTOMY Bilateral 05/05/2014   Procedure: BILATERAL TONSILLECTOMY  W/  ABLATATION OF ADENOIDS;  Surgeon: Darletta Moll, MD;  Location: [facility];  Service: ENT;  Laterality: Bilateral;  . TUBAL LIGATION  01/08/2004    OB History    Gravida Para Term Preterm AB Living             1   SAB TAB Ectopic Multiple Live Births                   Home Medications    Prior to Admission medications   Medication Sig Start Date End Date Taking? Authorizing Provider  amLODipine (NORVASC) 10 MG tablet Take 1 tablet (10 mg total) by mouth daily. 11/04/15   Horton, Mayer Masker, MD  famotidine (PEPCID) 20 MG tablet Take 1 tablet (20 mg total) by mouth 2 (two) times daily.  02/03/17   Donnetta Hutching, MD  lisinopril-hydrochlorothiazide (PRINZIDE,ZESTORETIC) 20-12.5 MG tablet Take 2 tablets by mouth daily. Patient taking differently: Take 1 tablet by mouth daily.  11/04/15   Horton, Mayer Masker, MD    Family History Family History  Problem Relation Age of Onset  . Colon cancer Mother   . Heart attack Father   . Hypertension Brother     Social History Social History  Substance Use Topics  . Smoking status: Never Smoker  . Smokeless tobacco: Never Used  . Alcohol use No     Allergies   Chocolate   Review of Systems Review of Systems  All other systems reviewed and are negative.    Physical Exam Updated Vital Signs BP (S) (!) 154/107 (BP Location: Left Arm)   Pulse (!) 108   Temp 98.1 F (36.7 C) (Oral)   Resp 18   Ht  (1.575 m)   Wt 126 kg (277 lb 11.2 oz)   LMP 02/02/2017 (Exact Date)   SpO2 98%   BMI 50.79 kg/m   Physical Exam  Constitutional: She is oriented to person, place, and time. She appears well-developed and well-nourished.  HENT:  Head: Normocephalic and atraumatic.  Eyes: Conjunctivae are normal.  Neck: Neck supple.  Cardiovascular: Normal rate and regular rhythm.   Pulmonary/Chest: Effort normal and breath sounds normal.  Abdominal: Soft. Bowel sounds are normal.  Musculoskeletal: Normal range of motion.  Neurological: She is alert and oriented to person, place, and time.  Skin:  Wheals on upper arms bilat  Psychiatric: She has a normal mood and affect. Her behavior is normal.  Nursing note and vitals reviewed.    ED Treatments / Results  Labs (all labs ordered are listed, but only abnormal results are displayed) Labs Reviewed - No data to display  EKG  EKG Interpretation None       Radiology No results found.  Procedures Procedures (including critical care time)  Medications Ordered in ED Medications  dexamethasone (DECADRON) injection 8 mg (8 mg Intramuscular Given 02/03/17 1238)    famotidine (PEPCID) tablet 40 mg (40 mg Oral Given 02/03/17 1238)     Initial Impression / Assessment and Plan / ED Course  I have reviewed the triage vital signs and the nursing notes.  Pertinent labs & imaging results that were available during my care of the patient were reviewed by me and considered in my medical decision making (see chart for details).     Patient presents with an allergic phenomenon.    Final Clinical Impressions(s) / ED Diagnoses   Final diagnoses:  Allergic reaction, initial encounter    New Prescriptions Discharge Medication List as of 02/03/2017  1:06 PM    START taking these medications   Details  famotidine (PEPCID) 20 MG tablet Take 1 tablet (20 mg total) by mouth 2 (two) times daily., Starting Sat 02/03/2017, Print         Donnetta Hutching, MD 02/03/17 2122

## 2017-07-29 ENCOUNTER — Other Ambulatory Visit: Payer: Self-pay

## 2017-07-29 ENCOUNTER — Encounter (HOSPITAL_COMMUNITY): Payer: Self-pay | Admitting: Student

## 2017-07-29 ENCOUNTER — Emergency Department (HOSPITAL_COMMUNITY): Payer: Medicaid Other

## 2017-07-29 ENCOUNTER — Emergency Department (HOSPITAL_COMMUNITY)
Admission: EM | Admit: 2017-07-29 | Discharge: 2017-07-29 | Disposition: A | Discharge status: Home / Self Care | Payer: Medicaid Other | Attending: Emergency Medicine | Admitting: Emergency Medicine

## 2017-07-29 DIAGNOSIS — I10 Essential (primary) hypertension: Secondary | ICD-10-CM | POA: Diagnosis not present

## 2017-07-29 DIAGNOSIS — Z79899 Other long term (current) drug therapy: Secondary | ICD-10-CM | POA: Insufficient documentation

## 2017-07-29 DIAGNOSIS — R1032 Left lower quadrant pain: Secondary | ICD-10-CM | POA: Diagnosis present

## 2017-07-29 LAB — URINALYSIS, ROUTINE W REFLEX MICROSCOPIC
Bilirubin Urine: NEGATIVE
Glucose, UA: NEGATIVE mg/dL
Hgb urine dipstick: NEGATIVE
Ketones, ur: NEGATIVE mg/dL
Leukocytes, UA: NEGATIVE
Nitrite: NEGATIVE
Protein, ur: NEGATIVE mg/dL
Specific Gravity, Urine: 1.016 (ref 1.005–1.030)
pH: 7 (ref 5.0–8.0)

## 2017-07-29 LAB — CBC WITH DIFFERENTIAL/PLATELET
Basophils Absolute: 0 K/uL (ref 0.0–0.1)
Basophils Relative: 0 %
Eosinophils Absolute: 0.1 K/uL (ref 0.0–0.7)
Eosinophils Relative: 1 %
HCT: 35.3 % — ABNORMAL LOW (ref 36.0–46.0)
Hemoglobin: 10.6 g/dL — ABNORMAL LOW (ref 12.0–15.0)
Lymphocytes Relative: 30 %
Lymphs Abs: 3.3 K/uL (ref 0.7–4.0)
MCH: 25.9 pg — ABNORMAL LOW (ref 26.0–34.0)
MCHC: 30 g/dL (ref 30.0–36.0)
MCV: 86.3 fL (ref 78.0–100.0)
Monocytes Absolute: 0.8 K/uL (ref 0.1–1.0)
Monocytes Relative: 8 %
Neutro Abs: 6.8 K/uL (ref 1.7–7.7)
Neutrophils Relative %: 61 %
Platelets: 479 K/uL — ABNORMAL HIGH (ref 150–400)
RBC: 4.09 MIL/uL (ref 3.87–5.11)
RDW: 15.5 % (ref 11.5–15.5)
WBC: 11.1 K/uL — ABNORMAL HIGH (ref 4.0–10.5)

## 2017-07-29 LAB — COMPREHENSIVE METABOLIC PANEL
ALBUMIN: 3.4 g/dL — AB (ref 3.5–5.0)
ALT: 12 U/L — AB (ref 14–54)
AST: 13 U/L — AB (ref 15–41)
Alkaline Phosphatase: 78 U/L (ref 38–126)
Anion gap: 10 (ref 5–15)
BILIRUBIN TOTAL: 0.6 mg/dL (ref 0.3–1.2)
BUN: 13 mg/dL (ref 6–20)
CO2: 28 mmol/L (ref 22–32)
CREATININE: 0.82 mg/dL (ref 0.44–1.00)
Calcium: 8.7 mg/dL — ABNORMAL LOW (ref 8.9–10.3)
Chloride: 98 mmol/L — ABNORMAL LOW (ref 101–111)
GFR calc Af Amer: >60 mL/min (ref >60)
GLUCOSE: 92 mg/dL (ref 65–99)
Potassium: 2.9 mmol/L — ABNORMAL LOW (ref 3.5–5.1)
Sodium: 136 mmol/L (ref 135–145)
TOTAL PROTEIN: 8.1 g/dL (ref 6.5–8.1)

## 2017-07-29 LAB — PREGNANCY, URINE: Preg Test, Ur: NEGATIVE

## 2017-07-29 MED ORDER — KETOROLAC TROMETHAMINE 30 MG/ML IJ SOLN
30.0000 mg | Freq: Once | INTRAMUSCULAR | Status: AC
  Administered 2017-07-29: 30 mg via INTRAVENOUS
  Filled 2017-07-29: qty 1

## 2017-07-29 MED ORDER — IOPAMIDOL (ISOVUE-300) INJECTION 61%
30.0000 mL | Freq: Once | INTRAVENOUS | Status: AC | PRN
  Administered 2017-07-29: 30 mL via ORAL

## 2017-07-29 MED ORDER — IOPAMIDOL (ISOVUE-300) INJECTION 61%
100.0000 mL | Freq: Once | INTRAVENOUS | Status: AC | PRN
  Administered 2017-07-29: 100 mL via INTRAVENOUS

## 2017-07-29 MED ORDER — POTASSIUM CHLORIDE CRYS ER 20 MEQ PO TBCR
40.0000 meq | EXTENDED_RELEASE_TABLET | Freq: Once | ORAL | Status: AC
  Administered 2017-07-29: 40 meq via ORAL
  Filled 2017-07-29: qty 2

## 2017-07-29 MED ORDER — SODIUM CHLORIDE 0.9 % IV BOLUS
1000.0000 mL | Freq: Once | INTRAVENOUS | Status: AC
  Administered 2017-07-29: 1000 mL via INTRAVENOUS

## 2017-07-29 NOTE — ED Triage Notes (Signed)
Pt c/o of LLQ pain that started 07/23/17. It got worse today. Denies N/V/D. Denies taking any medications to help the pain.

## 2017-07-29 NOTE — Discharge Instructions (Addendum)
Your evaluated in the emergency department for a few days of left lower quadrant abdominal pain.  We did not find an obvious cause of your symptoms after blood work urinalysis and CAT scan.  This is possibly muscular and you should continue Tylenol ibuprofen and heating pad to the area.  Please follow-up with your doctor and return if you have any worsening symptoms.

## 2017-07-29 NOTE — ED Provider Notes (Signed)
[facility] EMERGENCY DEPARTMENT Provider Note   CSN: [id number] Arrival date & time: 07/29/17  2028     History   Chief Complaint Chief Complaint  Patient presents with  . Abdominal Pain    HPI Denise Walters is a 37 y.o. female.  She is complaining of 1 week of left lower quadrant abdominal pain.  She is tried nothing for it.  The pain is made worse when she is trying to get up from lying in bed.  She rates the pain as 6 out of 10 that is 8 out of 10 when she is moving.  She denies any trauma.  Is not associated with any nausea vomiting constipation diarrhea fevers cough sore throat or any urinary symptoms.  Last menstrual period was 2 weeks ago.  She is having no vaginal discharge or bleeding.  She can think of nothing that has caused this pain.  The history is provided by the patient.  Abdominal Pain   This is a new problem. The current episode started more than 2 days ago. The problem occurs constantly. The problem has not changed since onset.The pain is associated with an unknown factor. The pain is located in the LLQ. The quality of the pain is sharp. The pain is moderate. Pertinent negatives include fever, diarrhea, hematochezia, melena, vomiting, constipation, dysuria, frequency, hematuria, headaches, arthralgias and myalgias. The symptoms are aggravated by activity. The symptoms are relieved by recumbency.    Past Medical History:  Diagnosis Date  . Chronic headache   . History of seizure    x 1 around 2010 - unknown cause  . Hypertension    states under control since 11/2013  . Tonsillar and adenoid hypertrophy 04/2014    Patient Active Problem List   Diagnosis Date Noted  . Rectal bleeding 06/15/2016  . FH: colon cancer 06/15/2016  . Constipation 06/15/2016    Past Surgical History:  Procedure Laterality Date  . BREAST REDUCTION SURGERY    . CESAREAN SECTION  10/05/2003  . CHOLECYSTECTOMY  12/01/2003  . COLONOSCOPY N/A 06/28/2016   Procedure: COLONOSCOPY;  Surgeon:  West Bali, MD;  Location: [facility];  Service: Endoscopy;  Laterality: N/A;  10:30am  . TONSILLECTOMY AND ADENOIDECTOMY Bilateral 05/05/2014   Procedure: BILATERAL TONSILLECTOMY  W/  ABLATATION OF ADENOIDS;  Surgeon: Darletta Moll, MD;  Location: [facility];  Service: ENT;  Laterality: Bilateral;  . TUBAL LIGATION  01/08/2004     OB History    Gravida      Para      Term      Preterm      AB      Living  1     SAB      TAB      Ectopic      Multiple      Live Births               Home Medications    Prior to Admission medications   Medication Sig Start Date End Date Taking? Authorizing Provider  amLODipine (NORVASC) 10 MG tablet Take 1 tablet (10 mg total) by mouth daily. 11/04/15   Horton, Mayer Masker, MD  famotidine (PEPCID) 20 MG tablet Take 1 tablet (20 mg total) by mouth 2 (two) times daily. 02/03/17   Donnetta Hutching, MD  lisinopril-hydrochlorothiazide (PRINZIDE,ZESTORETIC) 20-12.5 MG tablet Take 2 tablets by mouth daily. Patient taking differently: Take 1 tablet by mouth daily.  11/04/15   Horton, Mayer Masker,  MD    Family History Family History  Problem Relation Age of Onset  . Colon cancer Mother   . Heart attack Father   . Hypertension Brother     Social History Social History   Tobacco Use  . Smoking status: Never Smoker  . Smokeless tobacco: Never Used  Substance Use Topics  . Alcohol use: No  . Drug use: No     Allergies   Chocolate   Review of Systems Review of Systems  Constitutional: Negative for chills and fever.  HENT: Negative for ear pain and sore throat.   Eyes: Negative for pain and visual disturbance.  Respiratory: Negative for cough and shortness of breath.   Cardiovascular: Negative for chest pain and palpitations.  Gastrointestinal: Positive for abdominal pain. Negative for constipation, diarrhea, hematochezia, melena and vomiting.  Genitourinary: Negative for dysuria, frequency and hematuria.    Musculoskeletal: Negative for arthralgias, back pain and myalgias.  Skin: Negative for color change and rash.  Neurological: Negative for seizures, syncope and headaches.  All other systems reviewed and are negative.    Physical Exam Updated Vital Signs BP (!) 148/88 (BP Location: Right Arm)   Pulse (!) 105   Temp 98.4 F (36.9 C) (Oral)   Resp 20   Ht 5\' 2"  (1.575 m)   Wt 131.6 kg (290 lb 1 oz)   LMP 07/20/2017   SpO2 99%   BMI 53.05 kg/m   Physical Exam  Constitutional: She appears well-developed and well-nourished. No distress.  HENT:  Head: Normocephalic and atraumatic.  Eyes: Conjunctivae are normal.  Neck: Neck supple.  Cardiovascular: Normal rate and regular rhythm.  No murmur heard. Pulmonary/Chest: Effort normal and breath sounds normal. No respiratory distress.  Abdominal: Soft. There is tenderness in the left lower quadrant. There is no rigidity, no rebound and no guarding. No hernia.  Musculoskeletal: She exhibits no edema or deformity.  Neurological: She is alert.  Skin: Skin is warm and dry.  Psychiatric: She has a normal mood and affect.  Nursing note and vitals reviewed.    ED Treatments / Results  Labs (all labs ordered are listed, but only abnormal results are displayed) Labs Reviewed  COMPREHENSIVE METABOLIC PANEL - Abnormal; Notable for the following components:      Result Value   Potassium 2.9 (*)    Chloride 98 (*)    Calcium 8.7 (*)    Albumin 3.4 (*)    AST 13 (*)    ALT 12 (*)    All other components within normal limits  CBC WITH DIFFERENTIAL/PLATELET - Abnormal; Notable for the following components:   WBC 11.1 (*)    Hemoglobin 10.6 (*)    HCT 35.3 (*)    MCH 25.9 (*)    Platelets 479 (*)    All other components within normal limits  URINALYSIS, ROUTINE W REFLEX MICROSCOPIC  PREGNANCY, URINE    EKG None  Radiology Ct Abdomen Pelvis W Contrast  Result Date: 07/29/2017 CLINICAL DATA:  Left lower quadrant abdomen pain  starting July 23, 2017. EXAM: CT ABDOMEN AND PELVIS WITH CONTRAST TECHNIQUE: Multidetector CT imaging of the abdomen and pelvis was performed using the standard protocol following bolus administration of intravenous contrast. CONTRAST:  30mL ISOVUE-300 IOPAMIDOL (ISOVUE-300) INJECTION 61%, 100mL ISOVUE-300 IOPAMIDOL (ISOVUE-300) INJECTION 61% COMPARISON:  None. FINDINGS: Lower chest: No acute abnormality. Hepatobiliary: Diffuse low density of the liver without vessel displacement is identified. No focal liver lesion is identified. Patient is status post prior cholecystectomy. The biliary  ducts are normal. Pancreas: Unremarkable. No pancreatic ductal dilatation or surrounding inflammatory changes. Spleen: Normal in size without focal abnormality. Adrenals/Urinary Tract: The adrenal glands are normal. The right kidney is normal. There is no hydronephrosis bilaterally. There is a 5 mm nonobstructing stone in the mid to lower pole left kidney. There is a 2 cm low-density lesion in the anterior midpole left kidney, favor cysts. Stomach/Bowel: There is a small hiatal hernia. The stomach is otherwise normal. There is no small bowel obstruction. The colon is normal. There is no evidence of diverticulitis. The appendix is not seen but no inflammatory changes identified around the cecum to suggest appendicitis. Vascular/Lymphatic: No significant vascular findings are present. No enlarged abdominal or pelvic lymph nodes. Reproductive: Uterus and bilateral adnexa are unremarkable. Other: None.  Left pelvic phleboliths are identified. Musculoskeletal: No acute or significant osseous findings. IMPRESSION: No evidence of bowel obstruction or diverticulitis. No hydronephrosis bilaterally. Nonobstructing stone in left kidney. Fatty infiltration of liver. Prior cholecystectomy. Electronically Signed   By: Sherian Rein M.D.   On: 07/29/2017 22:01    Procedures Procedures (including critical care time)  Medications Ordered in  ED Medications  sodium chloride 0.9 % bolus 1,000 mL (has no administration in time range)  ketorolac (TORADOL) 30 MG/ML injection 30 mg (has no administration in time range)     Initial Impression / Assessment and Plan / ED Course  I have reviewed the triage vital signs and the nursing notes.  Pertinent labs & imaging results that were available during my care of the patient were reviewed by me and considered in my medical decision making (see chart for details).  Clinical Course as of Jul 31 1207  Wynelle Link Jul 29, 2017  2237 Reviewed patient's CT and lab work and urinalysis findings with her.  She is comfortable going home and following up with her doctor.  She understands indications for return.   [MB]    Clinical Course User Index [MB] Terrilee Files, MD    Final Clinical Impressions(s) / ED Diagnoses   Final diagnoses:  LLQ pain    ED Discharge Orders    None       Terrilee Files, MD 07/30/17 [phone]

## 2017-10-19 ENCOUNTER — Other Ambulatory Visit: Payer: Self-pay

## 2017-10-19 ENCOUNTER — Encounter (HOSPITAL_COMMUNITY): Payer: Self-pay | Admitting: Emergency Medicine

## 2017-10-19 ENCOUNTER — Emergency Department (HOSPITAL_COMMUNITY)
Admission: EM | Admit: 2017-10-19 | Discharge: 2017-10-19 | Disposition: A | Discharge status: Home / Self Care | Payer: Medicaid Other | Attending: Emergency Medicine | Admitting: Emergency Medicine

## 2017-10-19 ENCOUNTER — Emergency Department (HOSPITAL_COMMUNITY): Payer: Medicaid Other

## 2017-10-19 DIAGNOSIS — E876 Hypokalemia: Secondary | ICD-10-CM | POA: Insufficient documentation

## 2017-10-19 DIAGNOSIS — R0789 Other chest pain: Secondary | ICD-10-CM | POA: Insufficient documentation

## 2017-10-19 DIAGNOSIS — Z79899 Other long term (current) drug therapy: Secondary | ICD-10-CM | POA: Diagnosis not present

## 2017-10-19 DIAGNOSIS — I10 Essential (primary) hypertension: Secondary | ICD-10-CM | POA: Diagnosis not present

## 2017-10-19 DIAGNOSIS — R079 Chest pain, unspecified: Secondary | ICD-10-CM

## 2017-10-19 LAB — CBC
HCT: 36.2 % (ref 36.0–46.0)
Hemoglobin: 10.9 g/dL — ABNORMAL LOW (ref 12.0–15.0)
MCH: 25.8 pg — AB (ref 26.0–34.0)
MCHC: 30.1 g/dL (ref 30.0–36.0)
MCV: 85.8 fL (ref 78.0–100.0)
Platelets: 450 10*3/uL — ABNORMAL HIGH (ref 150–400)
RBC: 4.22 MIL/uL (ref 3.87–5.11)
RDW: 15.9 % — AB (ref 11.5–15.5)
WBC: 10.4 10*3/uL (ref 4.0–10.5)

## 2017-10-19 LAB — BASIC METABOLIC PANEL
Anion gap: 7 (ref 5–15)
BUN: 10 mg/dL (ref 6–20)
CALCIUM: 8.5 mg/dL — AB (ref 8.9–10.3)
CHLORIDE: 104 mmol/L (ref 98–111)
CO2: 27 mmol/L (ref 22–32)
CREATININE: 0.78 mg/dL (ref 0.44–1.00)
GFR calc Af Amer: >60 mL/min (ref >60)
GFR calc non Af Amer: >60 mL/min (ref >60)
GLUCOSE: 117 mg/dL — AB (ref 70–99)
Potassium: 3.2 mmol/L — ABNORMAL LOW (ref 3.5–5.1)
Sodium: 138 mmol/L (ref 135–145)

## 2017-10-19 LAB — I-STAT BETA HCG BLOOD, ED (MC, WL, AP ONLY): I-stat hCG, quantitative: <5 m[IU]/mL (ref <5)

## 2017-10-19 LAB — TROPONIN I

## 2017-10-19 MED ORDER — POTASSIUM CHLORIDE CRYS ER 20 MEQ PO TBCR
40.0000 meq | EXTENDED_RELEASE_TABLET | Freq: Once | ORAL | Status: AC
  Administered 2017-10-19: 40 meq via ORAL
  Filled 2017-10-19: qty 2

## 2017-10-19 MED ORDER — ACETAMINOPHEN 500 MG PO TABS
1000.0000 mg | ORAL_TABLET | Freq: Once | ORAL | Status: AC
  Administered 2017-10-19: 1000 mg via ORAL
  Filled 2017-10-19: qty 2

## 2017-10-19 MED ORDER — FAMOTIDINE 20 MG PO TABS
20.0000 mg | ORAL_TABLET | Freq: Once | ORAL | Status: AC
  Administered 2017-10-19: 20 mg via ORAL
  Filled 2017-10-19: qty 1

## 2017-10-19 MED ORDER — IBUPROFEN 400 MG PO TABS
600.0000 mg | ORAL_TABLET | Freq: Once | ORAL | Status: AC
  Administered 2017-10-19: 600 mg via ORAL
  Filled 2017-10-19: qty 2

## 2017-10-19 NOTE — Discharge Instructions (Addendum)
If you were given medicines take as directed.  If you are on coumadin or contraceptives realize their levels and effectiveness is altered by many different medicines.  If you have any reaction (rash, tongues swelling, other) to the medicines stop taking and see a physician.    If your blood pressure was elevated in the ER make sure you follow up for management with a primary doctor or return for chest pain, shortness of breath or stroke symptoms.  Please follow up as directed and return to the ER or see a physician for new or worsening symptoms.  Thank you. Vitals:   10/19/17 0914 10/19/17 0919  BP:  125/89  Pulse:  95  Resp:  17  Temp:  98.3 F (36.8 C)  TempSrc:  Oral  SpO2:  96%  Weight: 131.5 kg (290 lb)   Height: 5\' 2"  (1.575 m)

## 2017-10-19 NOTE — ED Provider Notes (Signed)
[facility] EMERGENCY DEPARTMENT Provider Note   CSN: [id number] Arrival date & time: 10/19/17  [id number]     History   Chief Complaint Chief Complaint  Patient presents with  . Chest Pain    HPI Denise Walters is a 37 y.o. female.  Patient presents with anterior chest pain nonradiating for the past 3 days.  Constant worse with movement.  No exertional symptoms.  No history of cardiac problems or blood clot history.  No family history of blood clots.  Patient's father did have a heart attack.  Patient denies cigarette smoking or cocaine abuse.  Pain mild at this time.     Past Medical History:  Diagnosis Date  . Chronic headache   . History of seizure    x 1 around 2010 - unknown cause  . Hypertension    states under control since 11/2013  . Seizures (HCC)   . Tonsillar and adenoid hypertrophy 04/2014    Patient Active Problem List   Diagnosis Date Noted  . Rectal bleeding 06/15/2016  . FH: colon cancer 06/15/2016  . Constipation 06/15/2016    Past Surgical History:  Procedure Laterality Date  . BREAST REDUCTION SURGERY    . CESAREAN SECTION  10/05/2003  . CHOLECYSTECTOMY  12/01/2003  . COLONOSCOPY N/A 06/28/2016   Procedure: COLONOSCOPY;  Surgeon: West BaliSandi L Fields, MD;  Location: [facility];  Service: Endoscopy;  Laterality: N/A;  10:30am  . TONSILLECTOMY    . TONSILLECTOMY AND ADENOIDECTOMY Bilateral 05/05/2014   Procedure: BILATERAL TONSILLECTOMY  W/  ABLATATION OF ADENOIDS;  Surgeon: Darletta MollSui W Teoh, MD;  Location: [facility];  Service: ENT;  Laterality: Bilateral;  . TUBAL LIGATION  01/08/2004     OB History    Gravida      Para      Term      Preterm      AB      Living  1     SAB      TAB      Ectopic      Multiple      Live Births               Home Medications    Prior to Admission medications   Medication Sig Start Date End Date Taking? Authorizing Provider  amLODipine (NORVASC) 10 MG tablet Take 10 mg by mouth daily.    Yes [provider]  carvedilol (COREG) 25 MG tablet Take 25 mg by mouth 2 (two) times daily. 07/25/17  Yes [provider]  lisinopril (PRINIVIL,ZESTRIL) 20 MG tablet Take 1 tablet by mouth daily. 07/25/17  Yes [provider]  lisinopril-hydrochlorothiazide (PRINZIDE,ZESTORETIC) 20-25 MG tablet Take 1 tablet by mouth daily. Add to the lisinopril 20 mg   Yes [provider]    Family History Family History  Problem Relation Age of Onset  . Colon cancer Mother   . Heart attack Father   . Hypertension Brother     Social History Social History   Tobacco Use  . Smoking status: Never Smoker  . Smokeless tobacco: Never Used  Substance Use Topics  . Alcohol use: No  . Drug use: No     Allergies   Chocolate   Review of Systems Review of Systems  Constitutional: Negative for chills and fever.  HENT: Negative for congestion.   Eyes: Negative for visual disturbance.  Respiratory: Negative for shortness of breath.   Cardiovascular: Positive for chest pain. Negative for leg swelling.  Gastrointestinal: Negative for abdominal pain and vomiting.  Genitourinary: Negative for dysuria and flank pain.  Musculoskeletal: Negative for back pain, neck pain and neck stiffness.  Skin: Negative for rash.  Neurological: Negative for light-headedness and headaches.     Physical Exam Updated Vital Signs BP 126/86   Pulse 88   Temp 98.3 F (36.8 C) (Oral)   Resp 18   Ht 5\' 2"  (1.575 m)   Wt 131.5 kg (290 lb)   LMP 10/03/2017   SpO2 99%   BMI 53.04 kg/m   Physical Exam  Constitutional: She is oriented to person, place, and time. She appears well-developed and well-nourished.  HENT:  Head: Normocephalic and atraumatic.  Eyes: Conjunctivae are normal. Right eye exhibits no discharge. Left eye exhibits no discharge.  Neck: Normal range of motion. Neck supple. No tracheal deviation present.  Cardiovascular: Normal rate, regular rhythm and normal pulses.    Pulmonary/Chest: Effort normal and breath sounds normal.  Patient has moderate tenderness to palpation anterior sternum/parasternal which patient says is identical to her pain.  Abdominal: Soft. She exhibits no distension. There is no tenderness. There is no guarding.  Musculoskeletal: She exhibits no edema.  Neurological: She is alert and oriented to person, place, and time.  Skin: Skin is warm. No rash noted.  Psychiatric: She has a normal mood and affect.  Nursing note and vitals reviewed.    ED Treatments / Results  Labs (all labs ordered are listed, but only abnormal results are displayed) Labs Reviewed  BASIC METABOLIC PANEL - Abnormal; Notable for the following components:      Result Value   Potassium 3.2 (*)    Glucose, Bld 117 (*)    Calcium 8.5 (*)    All other components within normal limits  CBC - Abnormal; Notable for the following components:   Hemoglobin 10.9 (*)    MCH 25.8 (*)    RDW 15.9 (*)    Platelets 450 (*)    All other components within normal limits  TROPONIN I  I-STAT BETA HCG BLOOD, ED ([facility], [facility], [facility] ONLY)    EKG EKG Interpretation  Date/Time:  Friday October 19 2017 09:17:41 EDT Ventricular Rate:  96 PR Interval:    QRS Duration: 80 QT Interval:  340 QTC Calculation: 430 R Axis:   52 Text Interpretation:  Sinus rhythm Baseline wander in lead(s) II III aVF Confirmed by Blane Ohara [phone]) on 10/19/2017 9:40:11 AM   Radiology Dg Chest 2 View  Result Date: 10/19/2017 CLINICAL DATA:  Mid chest pain for 3 days. EXAM: CHEST - 2 VIEW COMPARISON:  Two-view chest x-ray 11/04/2015 FINDINGS: The heart size and mediastinal contours are within normal limits. Both lungs are clear. The visualized skeletal structures are unremarkable. IMPRESSION: Negative. Electronically Signed   By: Marin Roberts M.D.   On: 10/19/2017 10:00    Procedures Procedures (including critical care time)  Medications Ordered in ED Medications  potassium chloride SA  (K-DUR,KLOR-CON) CR tablet 40 mEq (has no administration in time range)  acetaminophen (TYLENOL) tablet 1,000 mg (1,000 mg Oral Given 10/19/17 0951)  famotidine (PEPCID) tablet 20 mg (20 mg Oral Given 10/19/17 0951)  ibuprofen (ADVIL,MOTRIN) tablet 600 mg (600 mg Oral Given 10/19/17 1044)     Initial Impression / Assessment and Plan / ED Course  I have reviewed the triage vital signs and the nursing notes.  Pertinent labs & imaging results that were available during my care of the patient were reviewed by me and considered in  my medical decision making (see chart for details).    Well-appearing patient presents with constant anterior chest pain for 3 days.  Patient does have obesity, family history and high blood pressure.  Patient however does have atypical symptoms and fortunately is younger in age.  We discussed improving exercise and dietary choices.  We discussed screening for any signs of heart attack.  Patient has no blood clot risks factors and is PE RC negative.  Patient will follow-up closely outpatient or return for worsening symptoms assuming blood work reassuring. Patient improved on reassessment.  Discussed close outpatient follow-up.  Troponin negative, blood work unremarkable except for mild low potassium oral potassium ordered.  Patient will follow-up on Monday. Results and differential diagnosis were discussed with the patient/parent/guardian. Xrays were independently reviewed by myself.  Close follow up outpatient was discussed, comfortable with the plan.   Medications  potassium chloride SA (K-DUR,KLOR-CON) CR tablet 40 mEq (has no administration in time range)  acetaminophen (TYLENOL) tablet 1,000 mg (1,000 mg Oral Given 10/19/17 0951)  famotidine (PEPCID) tablet 20 mg (20 mg Oral Given 10/19/17 0951)  ibuprofen (ADVIL,MOTRIN) tablet 600 mg (600 mg Oral Given 10/19/17 1044)    Vitals:   10/19/17 0914 10/19/17 0919 10/19/17 0930 10/19/17 1000  BP:  125/89 (!) 116/98 126/86    Pulse:  95 89 88  Resp:  17 (!) 25 18  Temp:  98.3 F (36.8 C)    TempSrc:  Oral    SpO2:  96% 97% 99%  Weight: 131.5 kg (290 lb)     Height: 5\' 2"  (1.575 m)       Final diagnoses:  Acute chest pain  Hypokalemia     Final Clinical Impressions(s) / ED Diagnoses   Final diagnoses:  Acute chest pain  Hypokalemia    ED Discharge Orders    None       Blane Ohara, MD 10/19/17 1150

## 2017-10-19 NOTE — ED Triage Notes (Signed)
Pt reports sternal Cp X3 days with no radiation. Has tried baking soda and water without relief.

## 2017-10-19 NOTE — ED Notes (Signed)
EDP at bedside updating patient and family. 

## 2017-11-21 ENCOUNTER — Encounter: Payer: Self-pay | Admitting: Cardiology

## 2017-11-21 NOTE — Progress Notes (Signed)
Cardiology Office Note  Date: 11/22/2017   ID: Denise Walters, DOB [birth year], MRN [id number]  PCP: Sharlene Dory, NP  Consulting Cardiologist: Nona Dell, MD   Chief Complaint  Patient presents with  . Chest Pain    History of Present Illness: Denise Walters is a 37 y.o. female referred for cardiology consultation by Ms.Joelin NP for evaluation of chest pain.  Records indicate ER visit at [facility] back in June with fairly prolonged episode of chest discomfort.  Troponin I level was negative and ECG showed normal sinus rhythm.  Chest x-ray showed no acute findings.  She presents today and we discussed her symptoms.  She tells me that she experienced a pressure-like sensation in her chest after getting up one morning, this lasted essentially all day and night and due to persistence the following day she was seen in the ER.  She does not recall any trigger, thought it might have been indigestion and took Tums without relief.  She says that she has had these pains before, sometimes with exertion but not always.  She states that her father died in his early 62s with a heart attack.  She has a personal history of hypertension, reports no known diabetes mellitus or hyperlipidemia.  She has not undergone any previous ischemic testing.  Past Medical History:  Diagnosis Date  . Chronic headache   . Elevated lipids   . History of seizure 2010  . Hypertension   . Tonsillar and adenoid hypertrophy     Past Surgical History:  Procedure Laterality Date  . BREAST REDUCTION SURGERY    . CESAREAN SECTION  10/05/2003  . CHOLECYSTECTOMY  12/01/2003  . COLONOSCOPY N/A 06/28/2016   Procedure: COLONOSCOPY;  Surgeon: West Bali, MD;  [facility];  Service: Endoscopy;  Laterality: N/A;  10:30am  . TONSILLECTOMY    . TONSILLECTOMY AND ADENOIDECTOMY Bilateral 05/05/2014   Procedure: BILATERAL TONSILLECTOMY  W/  ABLATATION OF ADENOIDS;  Surgeon: Denise Moll, MD;  Location: [facility];  Service: ENT;  Laterality: Bilateral;  . TUBAL LIGATION  01/08/2004    Current Outpatient Medications  Medication Sig Dispense Refill  . amLODipine (NORVASC) 10 MG tablet Take 10 mg by mouth daily.    . carvedilol (COREG) 25 MG tablet Take 25 mg by mouth 2 (two) times daily.  3  . lisinopril (PRINIVIL,ZESTRIL) 20 MG tablet Take 1 tablet by mouth daily.  3  . lisinopril-hydrochlorothiazide (PRINZIDE,ZESTORETIC) 20-25 MG tablet Take 1 tablet by mouth daily. Add to the lisinopril 20 mg     No current facility-administered medications for this visit.    Allergies:  Chocolate   Social History: The patient  reports that she has never smoked. She has never used smokeless tobacco. She reports that she does not drink alcohol or use drugs.   Family History: The patient's family history includes Colon cancer in her mother; Heart attack in her father; Hypertension in her brother.   ROS:  Please see the history of present illness. Otherwise, complete review of systems is positive for none.  All other systems are reviewed and negative.   Physical Exam: VS:  BP 136/87   Pulse 94   Ht 5\' 2"  (1.575 m)   Wt 283 lb (128.4 kg)   SpO2 98%   BMI 51.76 kg/m , BMI Body mass index is 51.76 kg/m.  Wt Readings from Last 3 Encounters:  11/22/17 283 lb (128.4 kg)  10/19/17 290 lb (131.5  kg)  07/29/17 290 lb 1 oz (131.6 kg)    General: Morbidly obese woman, appears comfortable at rest. HEENT: Conjunctiva and lids normal, oropharynx clear. Neck: Supple, no elevated JVP or carotid bruits, no thyromegaly. Lungs: Clear to auscultation, nonlabored breathing at rest. Cardiac: Regular rate and rhythm, no S3, 2/6 systolic murmur, no pericardial rub. Abdomen: Soft, nontender, bowel sounds present. Extremities: No pitting edema, distal pulses 2+. Skin: Warm and dry. Musculoskeletal: No kyphosis. Neuropsychiatric: Alert and oriented x3, affect grossly appropriate.  ECG: I personally  reviewed the tracing from 10/19/2017 which showed sinus rhythm.  Recent Labwork: 07/29/2017: ALT 12; AST 13 10/19/2017: BUN 10; Creatinine, Ser 0.78; Hemoglobin 10.9; Platelets 450; Potassium 3.2; Sodium 138   Other Studies Reviewed Today:  Chest x-ray 10/19/2017: FINDINGS: The heart size and mediastinal contours are within normal limits. Both lungs are clear. The visualized skeletal structures are unremarkable.  IMPRESSION: Negative.  Assessment and Plan:  1.  Intermittent chest discomfort with typical and atypical features.  ER assessment in late June was overall reassuring with negative troponin I level and nonacute ECG.  Chest x-ray was also without acute findings.  She has a family history of premature CAD in her father and personal history of hypertension on multimodal therapy.  Plan is to further risk stratify with a Lexiscan Myoview, 2-day protocol.  2.  Heart murmur on examination, not previously evaluated, will obtain an echocardiogram.  3.  Essential hypertension, blood pressure control is reasonable today.  Current medicines were reviewed with the patient today.   Orders Placed This Encounter  Procedures  . NM Myocar Multi W/Spect W/Wall Motion / EF  . ECHOCARDIOGRAM COMPLETE    Disposition: Call with test results.  Signed, Jonelle SidleSamuel G. McDowell, MD, [facility] Hospital[facility]ACC 11/22/2017 2:50 PM    [facility] [address], [place], [place] [address] Phone: [phone]) [phone]; Fax: [phone]) [phone]

## 2017-11-22 ENCOUNTER — Encounter: Payer: Self-pay | Admitting: Cardiology

## 2017-11-22 ENCOUNTER — Telehealth: Payer: Self-pay | Admitting: Cardiology

## 2017-11-22 ENCOUNTER — Ambulatory Visit (INDEPENDENT_AMBULATORY_CARE_PROVIDER_SITE_OTHER): Payer: Medicaid Other | Admitting: Cardiology

## 2017-11-22 VITALS — BP 136/87 | HR 94 | Ht 62.0 in | Wt 283.0 lb

## 2017-11-22 DIAGNOSIS — R0789 Other chest pain: Secondary | ICD-10-CM | POA: Diagnosis not present

## 2017-11-22 DIAGNOSIS — R011 Cardiac murmur, unspecified: Secondary | ICD-10-CM | POA: Diagnosis not present

## 2017-11-22 DIAGNOSIS — Z8249 Family history of ischemic heart disease and other diseases of the circulatory system: Secondary | ICD-10-CM | POA: Diagnosis not present

## 2017-11-22 DIAGNOSIS — I10 Essential (primary) hypertension: Secondary | ICD-10-CM | POA: Diagnosis not present

## 2017-11-22 NOTE — Patient Instructions (Signed)
Medication Instructions:  Your physician recommends that you continue on your current medications as directed. Please refer to the Current Medication list given to you today.  Labwork: NONE  Testing/Procedures: Your physician has requested that you have an echocardiogram. Echocardiography is a painless test that uses sound waves to create images of your heart. It provides your doctor with information about the size and shape of your heart and how well your heart's chambers and valves are working. This procedure takes approximately one hour. There are no restrictions for this procedure.  Your physician has requested that you have a lexiscan myoview. For further information please visit [email or web address]. Please follow instruction sheet, as given.  Follow-Up: Your physician recommends that you schedule a follow-up appointment PENDING TEST RESULTS  Any Other Special Instructions Will Be Listed Below (If Applicable).  If you need a refill on your cardiac medications before your next appointment, please call your pharmacy. 

## 2017-11-22 NOTE — Telephone Encounter (Signed)
Pre-cert Verification for the following procedure   Lexiscan 2 DAY PROTOCOLL Echo    (both are scheduled for 11-28-2017) at [facility]

## 2017-11-28 ENCOUNTER — Ambulatory Visit (HOSPITAL_COMMUNITY)
Admission: RE | Admit: 2017-11-28 | Discharge: 2017-11-28 | Disposition: A | Discharge status: Home / Self Care | Payer: Medicaid Other | Source: Ambulatory Visit | Attending: Family | Admitting: Family

## 2017-11-28 ENCOUNTER — Encounter (HOSPITAL_COMMUNITY): Payer: Self-pay

## 2017-11-28 ENCOUNTER — Encounter (HOSPITAL_BASED_OUTPATIENT_CLINIC_OR_DEPARTMENT_OTHER)
Admission: RE | Admit: 2017-11-28 | Discharge: 2017-11-28 | Disposition: A | Discharge status: Home / Self Care | Payer: Medicaid Other | Source: Ambulatory Visit | Attending: Cardiology | Admitting: Cardiology

## 2017-11-28 ENCOUNTER — Ambulatory Visit (HOSPITAL_BASED_OUTPATIENT_CLINIC_OR_DEPARTMENT_OTHER)
Admission: RE | Admit: 2017-11-28 | Discharge: 2017-11-28 | Disposition: A | Discharge status: Home / Self Care | Payer: Medicaid Other | Source: Ambulatory Visit | Attending: Cardiology | Admitting: Cardiology

## 2017-11-28 DIAGNOSIS — Z6841 Body Mass Index (BMI) 40.0 and over, adult: Secondary | ICD-10-CM | POA: Diagnosis not present

## 2017-11-28 DIAGNOSIS — R0789 Other chest pain: Secondary | ICD-10-CM | POA: Insufficient documentation

## 2017-11-28 DIAGNOSIS — R011 Cardiac murmur, unspecified: Secondary | ICD-10-CM | POA: Diagnosis present

## 2017-11-28 DIAGNOSIS — I10 Essential (primary) hypertension: Secondary | ICD-10-CM | POA: Diagnosis not present

## 2017-11-28 MED ORDER — SODIUM CHLORIDE 0.9% FLUSH
INTRAVENOUS | Status: AC
  Administered 2017-11-28: 10 mL via INTRAVENOUS
  Filled 2017-11-28: qty 10

## 2017-11-28 MED ORDER — REGADENOSON 0.4 MG/5ML IV SOLN
INTRAVENOUS | Status: AC
  Administered 2017-11-28: 0.4 mg via INTRAVENOUS
  Filled 2017-11-28: qty 5

## 2017-11-28 MED ORDER — TECHNETIUM TC 99M TETROFOSMIN IV KIT
30.0000 | PACK | Freq: Once | INTRAVENOUS | Status: AC | PRN
  Administered 2017-11-28: 32 via INTRAVENOUS

## 2017-11-28 NOTE — Progress Notes (Signed)
*  PRELIMINARY RESULTS* Echocardiogram 2D Echocardiogram has been performed.  Stacey DrainWhite, Vernis Cabacungan J 11/28/2017, 12:17 PM

## 2017-11-29 ENCOUNTER — Telehealth: Payer: Self-pay

## 2017-11-29 ENCOUNTER — Encounter (HOSPITAL_COMMUNITY)
Admission: RE | Admit: 2017-11-29 | Discharge: 2017-11-29 | Disposition: A | Discharge status: Home / Self Care | Payer: Medicaid Other | Source: Ambulatory Visit | Attending: Cardiology | Admitting: Cardiology

## 2017-11-29 ENCOUNTER — Encounter (HOSPITAL_COMMUNITY): Payer: Self-pay

## 2017-11-29 LAB — NM MYOCAR MULTI W/SPECT W/WALL MOTION / EF
CHL CUP RESTING HR STRESS: 95 {beats}/min
LHR: 0.25
LV dias vol: 95 mL (ref 46–106)
LVSYSVOL: 41 mL
Peak HR: 95 {beats}/min
SDS: 7
SRS: 2
SSS: 9
TID: 0.93

## 2017-11-29 MED ORDER — TECHNETIUM TC 99M TETROFOSMIN IV KIT
25.0000 | PACK | Freq: Once | INTRAVENOUS | Status: AC | PRN
  Administered 2017-11-29: 24 via INTRAVENOUS

## 2017-11-29 NOTE — Telephone Encounter (Signed)
Patient notified. Routed to PCP 

## 2017-11-29 NOTE — Telephone Encounter (Signed)
-----   Message from Ellsworth LennoxBrittany M Strader, [place]C sent at 11/28/2017  2:41 PM EDT ----- Please let the patient know her echocardiogram showed normal pumping function of the heart with a preserved EF of 60-65%. Did have some thickness of the heart muscle while is likely due to her history of HTN. No significant valve abnormalities. Please forward a copy to Sharlene DoryJoelin, Vineetha, NP. Thank you.

## 2017-12-04 ENCOUNTER — Telehealth: Payer: Self-pay

## 2017-12-04 NOTE — Telephone Encounter (Signed)
-----   Message from Ellsworth LennoxBrittany M Strader, [place]-C sent at 11/29/2017  5:19 PM EDT ----- Covering for Dr. Diona BrownerMcDowell - Please let the patient know that her stress test showed no evidence of significant ischemia (potential blockage) and was overall a low risk study. Please forward a copy to Sharlene DoryJoelin, Vineetha, NP.

## 2017-12-04 NOTE — Telephone Encounter (Signed)
Patient notified. Routed to PCP 

## 2018-01-22 ENCOUNTER — Ambulatory Visit (INDEPENDENT_AMBULATORY_CARE_PROVIDER_SITE_OTHER): Payer: Medicaid Other | Admitting: Gastroenterology

## 2018-01-22 ENCOUNTER — Encounter: Payer: Self-pay | Admitting: Gastroenterology

## 2018-01-22 ENCOUNTER — Encounter

## 2018-01-22 DIAGNOSIS — R079 Chest pain, unspecified: Secondary | ICD-10-CM | POA: Insufficient documentation

## 2018-01-22 DIAGNOSIS — R0789 Other chest pain: Secondary | ICD-10-CM | POA: Diagnosis not present

## 2018-01-22 DIAGNOSIS — K760 Fatty (change of) liver, not elsewhere classified: Secondary | ICD-10-CM | POA: Diagnosis not present

## 2018-01-22 MED ORDER — PANTOPRAZOLE SODIUM 40 MG PO TBEC: 40.0000 mg | DELAYED_RELEASE_TABLET | Freq: Every day | ORAL | 3 refills | Status: DC

## 2018-01-22 NOTE — Progress Notes (Signed)
CC'D TO PCP °

## 2018-01-22 NOTE — Patient Instructions (Signed)
1. Start pantoprazole once daily 30 minutes before first meal of the day.  2. Call in 1-2 weeks and let me know how you are feeling. If persistent symptoms, would consider upper endoscopy.  3. You have fatty liver seen on prior CT. It is important to consider weight loss options to control damage to your liver over time.    Food Choices for Gastroesophageal Reflux Disease, Adult When you have gastroesophageal reflux disease (GERD), the foods you eat and your eating habits are very important. Choosing the right foods can help ease the discomfort of GERD. Consider working with a diet and nutrition specialist (dietitian) to help you make healthy food choices. What general guidelines should I follow? Eating plan  Choose healthy foods low in fat, such as fruits, vegetables, whole grains, low-fat dairy products, and lean meat, fish, and poultry.  Eat frequent, small meals instead of three large meals each day. Eat your meals slowly, in a relaxed setting. Avoid bending over or lying down until 2-3 hours after eating.  Limit high-fat foods such as fatty meats or fried foods.  Limit your intake of oils, butter, and shortening to less than 8 teaspoons each day.  Avoid the following: ? Foods that cause symptoms. These may be different for different people. Keep a food diary to keep track of foods that cause symptoms. ? Alcohol. ? Drinking large amounts of liquid with meals. ? Eating meals during the 2-3 hours before bed.  Cook foods using methods other than frying. This may include baking, grilling, or broiling. Lifestyle   Maintain a healthy weight. Ask your health care provider what weight is healthy for you. If you need to lose weight, work with your health care provider to do so safely.  Exercise for at least 30 minutes on 5 or more days each week, or as told by your health care provider.  Avoid wearing clothes that fit tightly around your waist and chest.  Do not use any products that  contain nicotine or tobacco, such as cigarettes and e-cigarettes. If you need help quitting, ask your health care provider.  Sleep with the head of your bed raised. Use a wedge under the mattress or blocks under the bed frame to raise the head of the bed. What foods are not recommended? The items listed may not be a complete list. Talk with your dietitian about what dietary choices are best for you. Grains Pastries or quick breads with added fat. [place] toast. Vegetables Deep fried vegetables. [place] fries. Any vegetables prepared with added fat. Any vegetables that cause symptoms. For some people this may include tomatoes and tomato products, chili peppers, onions and garlic, and horseradish. Fruits Any fruits prepared with added fat. Any fruits that cause symptoms. For some people this may include citrus fruits, such as oranges, grapefruit, pineapple, and lemons. Meats and other protein foods High-fat meats, such as fatty beef or pork, hot dogs, ribs, ham, sausage, salami and bacon. Fried meat or protein, including fried fish and fried chicken. Nuts and nut butters. Dairy Whole milk and chocolate milk. Sour cream. Cream. Ice cream. Cream cheese. Milk shakes. Beverages Coffee and tea, with or without caffeine. Carbonated beverages. Sodas. Energy drinks. Fruit juice made with acidic fruits (such as orange or grapefruit). Tomato juice. Alcoholic drinks. Fats and oils Butter. Margarine. Shortening. Ghee. Sweets and desserts Chocolate and cocoa. Donuts. Seasoning and other foods Pepper. Peppermint and spearmint. Any condiments, herbs, or seasonings that cause symptoms. For some people, this may include curry,  hot sauce, or vinegar-based salad dressings. Summary  When you have gastroesophageal reflux disease (GERD), food and lifestyle choices are very important to help ease the discomfort of GERD.  Eat frequent, small meals instead of three large meals each day. Eat your meals slowly, in a  relaxed setting. Avoid bending over or lying down until 2-3 hours after eating.  Limit high-fat foods such as fatty meat or fried foods. This information is not intended to replace advice given to you by your health care provider. Make sure you discuss any questions you have with your health care provider. Document Released: 04/10/2005 Document Revised: 04/11/2016 Document Reviewed: 04/11/2016 Elsevier Interactive Patient Education  2018 Elsevier Inc.   Fatty Liver Fatty liver, also called hepatic steatosis or steatohepatitis, is a condition in which too much fat has built up in your liver cells. The liver removes harmful substances from your bloodstream. It produces fluids your body needs. It also helps your body use and store energy from the food you eat. In many cases, fatty liver does not cause symptoms or problems. It is often diagnosed when tests are being done for other reasons. However, over time, fatty liver can cause inflammation that may lead to more serious liver problems, such as scarring of the liver (cirrhosis). What are the causes? Causes of fatty liver may include:  Drinking too much alcohol.  Poor nutrition.  Obesity.  Cushing syndrome.  Diabetes.  Hyperlipidemia.  Pregnancy.  Certain drugs.  Poisons.  Some viral infections.  What increases the risk? You may be more likely to develop fatty liver if you:  Abuse alcohol.  Are pregnant.  Are overweight.  Have diabetes.  Have hepatitis.  Have a high triglyceride level.  What are the signs or symptoms? Fatty liver often does not cause any symptoms. In cases where symptoms develop, they can include:  Fatigue.  Weakness.  Weight loss.  Confusion.  Abdominal pain.  Yellowing of your skin and the white parts of your eyes (jaundice).  Nausea and vomiting.  How is this diagnosed? Fatty liver may be diagnosed by:  Physical exam and medical history.  Blood tests.  Imaging tests, such as  an ultrasound, CT scan, or MRI.  Liver biopsy. A small sample of liver tissue is removed using a needle. The sample is then looked at under a microscope.  How is this treated? Fatty liver is often caused by other health conditions. Treatment for fatty liver may involve medicines and lifestyle changes to manage conditions such as:  Alcoholism.  High cholesterol.  Diabetes.  Being overweight or obese.  Follow these instructions at home:  Eat a healthy diet as directed by your health care provider.  Exercise regularly. This can help you lose weight and control your cholesterol and diabetes. Talk to your health care provider about an exercise plan and which activities are best for you.  Do not drink alcohol.  Take medicines only as directed by your health care provider. Contact a health care provider if: You have difficulty controlling your:  Blood sugar.  Cholesterol.  Alcohol consumption.  Get help right away if:  You have abdominal pain.  You have jaundice.  You have nausea and vomiting. This information is not intended to replace advice given to you by your health care provider. Make sure you discuss any questions you have with your health care provider. Document Released: 05/26/2005 Document Revised: 09/16/2015 Document Reviewed: 08/20/2013 Elsevier Interactive Patient Education  [employer].

## 2018-01-22 NOTE — Assessment & Plan Note (Signed)
37 year old morbidly obese female with several month history of discomfort in the substernal chest region often worse with certain foods, tea.  She has been evaluated by cardiology with low risk stress test.  She has not tried significant reflux regimen.  Would offer pantoprazole 40 mg daily before breakfast, if no response would consider upper endoscopy.  She will call in 1 to 2 weeks with a progress report.  Reinforced antireflux measures as well.

## 2018-01-22 NOTE — Progress Notes (Signed)
Primary Care Physician:  Sharlene Dory, NP  Primary Gastroenterologist:  Jonette Eva, MD   Chief Complaint  Patient presents with  . Chest Pain    in center of chest when she eats certain foods, drinks tea     HPI:  Denise Walters is a 37 y.o. female here for further evaluation of noncardiac chest pain.  She was last seen in 2018 rectal bleeding.  Colonoscopy with diverticulosis, hemorrhoids.  Next colonoscopy in 5 years due to family history of colon cancer.  Patient complains of several month history of chest discomfort.  Occurring about once per week but may last for 2 to 3 days in a row.  No burning.  Feels like someone is pressing forward in the center of her chest and will not let up.  Tries to walk it off but due to her weight she gets short of breath.  No diaphoresis.  She denies typical heartburn.  She denies dysphagia, odynophagia.  No abdominal pain.  Her bowel movements are okay.  No blood in stool or melena.  For the chest pain she has tried baking soda with water, TUMS does not work.  Occasional Tylenol for headache, denies NSAID or aspirin use.  She has heavy menstrual cycles lasting for more than a week at a time, monthly.  Hemoglobin has been in the 10.9-11.5 range for the past 2 years, hematocrit normal.  CT abdomen pelvis with contrast in April showed nonobstructing stone in the left kidney, fatty liver.  Current Outpatient Medications  Medication Sig Dispense Refill  . amLODipine (NORVASC) 10 MG tablet Take 10 mg by mouth daily.    . carvedilol (COREG) 25 MG tablet Take 25 mg by mouth 2 (two) times daily.  3  . lisinopril (PRINIVIL,ZESTRIL) 20 MG tablet Take 1 tablet by mouth daily.  3  . lisinopril-hydrochlorothiazide (PRINZIDE,ZESTORETIC) 20-25 MG tablet Take 1 tablet by mouth daily. Add to the lisinopril 20 mg     No current facility-administered medications for this visit.     Allergies as of 01/22/2018 - Review Complete 01/22/2018  Allergen Reaction Noted   . Chocolate Hives 04/24/2011    Past Medical History:  Diagnosis Date  . Chronic headache   . Elevated lipids   . History of seizure 2010  . Hypertension   . Tonsillar and adenoid hypertrophy     Past Surgical History:  Procedure Laterality Date  . BREAST REDUCTION SURGERY    . CESAREAN SECTION  10/05/2003  . CHOLECYSTECTOMY  12/01/2003  . COLONOSCOPY N/A 06/28/2016   Procedure: COLONOSCOPY;  Surgeon: West Bali, MD;  [facility];  Service: Endoscopy;  Laterality: N/A;  10:30am  . TONSILLECTOMY    . TONSILLECTOMY AND ADENOIDECTOMY Bilateral 05/05/2014   Procedure: BILATERAL TONSILLECTOMY  W/  ABLATATION OF ADENOIDS;  Surgeon: Darletta Moll, MD;  Location: [facility];  Service: ENT;  Laterality: Bilateral;  . TUBAL LIGATION  01/08/2004    Family History  Problem Relation Age of Onset  . Colon cancer Mother   . Heart attack Father   . Hypertension Brother     Social History   Socioeconomic History  . Marital status: Single    Spouse name: Not on file  . Number of children: Not on file  . Years of education: Not on file  . Highest education level: Not on file  Occupational History  . Not on file  Social Needs  . Financial resource strain: Not on file  .  Food insecurity:    Worry: Not on file    Inability: Not on file  . Transportation needs:    Medical: Not on file    Non-medical: Not on file  Tobacco Use  . Smoking status: Never Smoker  . Smokeless tobacco: Never Used  Substance and Sexual Activity  . Alcohol use: No  . Drug use: No  . Sexual activity: Yes    Birth control/protection: Surgical  Lifestyle  . Physical activity:    Days per week: Not on file    Minutes per session: Not on file  . Stress: Not on file  Relationships  . Social connections:    Talks on phone: Not on file    Gets together: Not on file    Attends religious service: Not on file    Active member of club or organization: Not on file    Attends meetings of  clubs or organizations: Not on file    Relationship status: Not on file  . Intimate partner violence:    Fear of current or ex partner: Not on file    Emotionally abused: Not on file    Physically abused: Not on file    Forced sexual activity: Not on file  Other Topics Concern  . Not on file  Social History Narrative  . Not on file      ROS:  General: Negative for anorexia, weight loss, fever, chills, fatigue, weakness. Eyes: Negative for vision changes.  ENT: Negative for hoarseness, difficulty swallowing , nasal congestion. CV: Negative for chest pain, angina, palpitations, dyspnea on exertion, peripheral edema.  See HPI Respiratory: Negative for dyspnea at rest, dyspnea on exertion, cough, sputum, wheezing.  GI: See history of present illness. GU:  Negative for dysuria, hematuria, urinary incontinence, urinary frequency, nocturnal urination.  MS: Negative for joint pain, low back pain.  Derm: Negative for rash or itching.  Neuro: Negative for weakness, abnormal sensation, seizure, frequent headaches, memory loss, confusion.  Psych: Negative for anxiety, depression, suicidal ideation, hallucinations.  Endo: Negative for unusual weight change.  Heme: Negative for bruising or bleeding. Allergy: Negative for rash or hives.    Physical Examination:  BP (!) 141/94   Pulse 89   Temp 98.3 F (36.8 C) (Oral)   Ht 5\' 2"  (1.575 m)   Wt 286 lb (129.7 kg)   LMP 01/09/2018   BMI 52.31 kg/m    General: Well-nourished, well-developed in no acute distress.  Head: Normocephalic, atraumatic.   Eyes: Conjunctiva pink, no icterus. Mouth: Oropharyngeal mucosa moist and pink , no lesions erythema or exudate. Neck: Supple without thyromegaly, masses, or lymphadenopathy.  Lungs: Clear to auscultation bilaterally.  Heart: Regular rate and rhythm, no murmurs rubs or gallops.  Abdomen: Bowel sounds are normal, nontender, nondistended, no hepatosplenomegaly or masses, no abdominal bruits or     hernia , no rebound or guarding.   Rectal: Not performed Extremities: No lower extremity edema. No clubbing or deformities.  Neuro: Alert and oriented x 4 , grossly normal neurologically.  Skin: Warm and dry, no rash or jaundice.   Psych: Alert and cooperative, normal mood and affect.  Labs: Lab Results  Component Value Date   CREATININE 0.78 10/19/2017   BUN 10 10/19/2017   NA 138 10/19/2017   K 3.2 (L) 10/19/2017   CL 104 10/19/2017   CO2 27 10/19/2017   Lab Results  Component Value Date   ALT 12 (L) 07/29/2017   AST 13 (L) 07/29/2017   ALKPHOS 78  07/29/2017   BILITOT 0.6 07/29/2017   Lab Results  Component Value Date   WBC 10.4 10/19/2017   HGB 10.9 (L) 10/19/2017   HCT 36.2 10/19/2017   MCV 85.8 10/19/2017   PLT 450 (H) 10/19/2017     Imaging Studies: No results found.

## 2018-01-22 NOTE — Assessment & Plan Note (Signed)
Fatty liver noted on CT.  LFTs normal.  Discussed with patient need for her to consider her weight loss options to improve her overall health and hopefully avoid chronic liver disease due to hepatic steatosis.  She reports having referral to initiate weight loss surgery.

## 2018-12-12 ENCOUNTER — Encounter (HOSPITAL_COMMUNITY): Payer: Self-pay

## 2018-12-12 ENCOUNTER — Emergency Department (HOSPITAL_COMMUNITY)
Admission: EM | Admit: 2018-12-12 | Discharge: 2018-12-12 | Disposition: A | Discharge status: Home / Self Care | Payer: Medicaid Other | Attending: Emergency Medicine | Admitting: Emergency Medicine

## 2018-12-12 ENCOUNTER — Other Ambulatory Visit: Payer: Self-pay

## 2018-12-12 DIAGNOSIS — Z79899 Other long term (current) drug therapy: Secondary | ICD-10-CM | POA: Diagnosis not present

## 2018-12-12 DIAGNOSIS — L02411 Cutaneous abscess of right axilla: Secondary | ICD-10-CM | POA: Diagnosis present

## 2018-12-12 DIAGNOSIS — I10 Essential (primary) hypertension: Secondary | ICD-10-CM | POA: Insufficient documentation

## 2018-12-12 HISTORY — DX: Cutaneous abscess, unspecified: L02.91

## 2018-12-12 LAB — BASIC METABOLIC PANEL
Anion gap: 9 (ref 5–15)
BUN: 9 mg/dL (ref 6–20)
CO2: 24 mmol/L (ref 22–32)
Calcium: 8.5 mg/dL — ABNORMAL LOW (ref 8.9–10.3)
Chloride: 104 mmol/L (ref 98–111)
Creatinine, Ser: 0.68 mg/dL (ref 0.44–1.00)
GFR calc Af Amer: >60 mL/min (ref >60)
GFR calc non Af Amer: >60 mL/min (ref >60)
Glucose, Bld: 96 mg/dL (ref 70–99)
Potassium: 3.3 mmol/L — ABNORMAL LOW (ref 3.5–5.1)
Sodium: 137 mmol/L (ref 135–145)

## 2018-12-12 LAB — CBC WITH DIFFERENTIAL/PLATELET
Abs Immature Granulocytes: 0.04 10*3/uL (ref 0.00–0.07)
Basophils Absolute: 0 10*3/uL (ref 0.0–0.1)
Basophils Relative: 0 %
Eosinophils Absolute: 0.2 10*3/uL (ref 0.0–0.5)
Eosinophils Relative: 1 %
HCT: 35.9 % — ABNORMAL LOW (ref 36.0–46.0)
Hemoglobin: 10.3 g/dL — ABNORMAL LOW (ref 12.0–15.0)
Immature Granulocytes: 0 %
Lymphocytes Relative: 20 %
Lymphs Abs: 2.4 10*3/uL (ref 0.7–4.0)
MCH: 24.2 pg — ABNORMAL LOW (ref 26.0–34.0)
MCHC: 28.7 g/dL — ABNORMAL LOW (ref 30.0–36.0)
MCV: 84.3 fL (ref 80.0–100.0)
Monocytes Absolute: 0.7 10*3/uL (ref 0.1–1.0)
Monocytes Relative: 6 %
Neutro Abs: 9 10*3/uL — ABNORMAL HIGH (ref 1.7–7.7)
Neutrophils Relative %: 73 %
Platelets: 494 10*3/uL — ABNORMAL HIGH (ref 150–400)
RBC: 4.26 MIL/uL (ref 3.87–5.11)
RDW: 17 % — ABNORMAL HIGH (ref 11.5–15.5)
WBC: 12.3 10*3/uL — ABNORMAL HIGH (ref 4.0–10.5)
nRBC: 0 % (ref 0.0–0.2)

## 2018-12-12 LAB — LACTIC ACID, PLASMA: Lactic Acid, Venous: 0.7 mmol/L (ref 0.5–1.9)

## 2018-12-12 MED ORDER — OXYCODONE HCL 5 MG PO TABS: 5.0000 mg | ORAL_TABLET | Freq: Four times a day (QID) | ORAL | 0 refills | Status: AC | PRN

## 2018-12-12 MED ORDER — ACETAMINOPHEN 325 MG PO TABS
650.0000 mg | ORAL_TABLET | Freq: Once | ORAL | Status: AC
  Administered 2018-12-12: 20:00:00 650 mg via ORAL
  Filled 2018-12-12: qty 2

## 2018-12-12 MED ORDER — DOXYCYCLINE HYCLATE 100 MG PO CAPS: 100.0000 mg | ORAL_CAPSULE | Freq: Two times a day (BID) | ORAL | 0 refills | Status: DC

## 2018-12-12 MED ORDER — LIDOCAINE-EPINEPHRINE (PF) 2 %-1:200000 IJ SOLN
20.0000 mL | Freq: Once | INTRAMUSCULAR | Status: AC
  Administered 2018-12-12: 20 mL
  Filled 2018-12-12: qty 20

## 2018-12-12 MED ORDER — OXYCODONE-ACETAMINOPHEN 5-325 MG PO TABS
1.0000 | ORAL_TABLET | Freq: Once | ORAL | Status: AC
Start: 2018-12-12 — End: 2018-12-12
  Administered 2018-12-12: 1 via ORAL
  Filled 2018-12-12: qty 1

## 2018-12-12 MED ORDER — DOXYCYCLINE HYCLATE 100 MG PO TABS
100.0000 mg | ORAL_TABLET | Freq: Once | ORAL | Status: AC
  Administered 2018-12-12: 22:00:00 100 mg via ORAL
  Filled 2018-12-12: qty 1

## 2018-12-12 NOTE — ED Triage Notes (Signed)
Pt has large abscess under right axilla. Abscess is draining large amount of purulent drainage. Pt states she has had the abscess for 4 days. Pt states she put fatback on wound to cleanse it.

## 2018-12-12 NOTE — Discharge Instructions (Addendum)
°  You have an abscess.  This was incised and drained. Packing was placed.   Lab work was reassuring.   Treatment of abscess includes antibiotics, warm therapy, ibuprofen and acetaminophen and wound care  Take doxycycline as prescribed and until completed. Symptoms typically improve in 48-72 hours.   Apply heat (heating pad) as often as possible  It is normal for some drainage and blood to continue to ooze out over the next few days up to a week, ideally this will decrease over time  Your abscess was large and you need a wound re-check in 72 hours. Go to urgent care or emergency department for wound and packing check in 72 hrs.  Any abscess can worsen, enlarge and spread infection into blood stream.  Return to the ER immediately if you have fevers, chills, worsening swelling, redness, warmth.   For pain and inflammation you can use a combination of ibuprofen and acetaminophen.  Take [phone] mg acetaminophen (tylenol) every 6 hours or 600 mg ibuprofen (advil, motrin) every 6 hours.  You can take these separately or combine them every 6 hours for maximum pain control. Do not exceed 4,000 mg acetaminophen or 2,400 mg ibuprofen in a 24 hour period.  Do not take ibuprofen containing products if you have history of kidney disease, ulcers, GI bleeding, severe acid reflux, or take a blood thinner.  Do not take acetaminophen if you have liver disease.   For break through and/or severe pain despite ibuprofen and acetaminophen regimen, take 5 mg oxycodone every 4 hours.  Oxycodone is a narcotic pain medication that has risk of overdose, death, dependence and abuse. Mild and expected side effects include nausea, stomach upset, drowsiness, constipation. Do not consume alcohol, drive or use heavy machinery while taking this medication. Do not leave unattended around children. Flush any remaining pills that you do not use and do not share.  The emergency department has a strict policy regarding prescription of  narcotic medications. We prescribe a short course for acute, new pain or injuries. We are unable to refill this medication in the emergency department for chronic pain or repeatedly.  Refill need to be done by specialist or primary care provider or pain clinic.  Contact your primary care provider or specialist for chronic pain management and refill on narcotic medications.   Your blood pressure was elevated today, make sure to take all blood pressure medicines and re-check your blood pressure at home. Follow up with your primary care doctor if you blood pressure is persistently higher than 140/60s because you may need medication changes.  Return to the ER if there is severe headache, chest pain, shortness of breath, leg swelling, severe abdominal or back pain, stroke symptoms

## 2018-12-12 NOTE — ED Provider Notes (Signed)
[facility] Provider Note   CSN: [id number] Arrival date & time: 12/12/18  1812     History   Chief Complaint Chief Complaint  Patient presents with  . Abscess    HPI Denise Walters is a 38 y.o. female with history of obesity, hyperlipidemia, hypertension presents to the ER for evaluation of boil to the right armpit.  Onset 4 days ago.  It is gradually worsening.  Associated with moderate to severe local pain, swelling, redness and pus drainage.  There is swelling around the abscess.  She has been putting hot compresses and fat pack on it to help with the draining.  No other interventions.  No alleviating factors.  Aggravated with any palpation and movement of her arm.  She denies any fevers, chills.  She denies any history of diabetes.  Incidentally noted to be hypertensive in the ER 186/131 however patient states that her blood pressure is hard to control and she is usually in the systolic 190s.  She has been compliant with all of her blood pressure medicines including amlodipine, carvedilol, lisinopril and HCTZ.  States she has not been sleeping well because of the pain in her armpit and there is usually makes her blood pressure higher.  She denies any severe headache, chest pain, shortness of breath, severe abdominal or back pain, lower extremity edema, orthopnea, stroke symptoms.     HPI  Past Medical History:  Diagnosis Date  . Abscess   . Chronic headache   . Elevated lipids   . History of seizure 2010  . Hypertension   . Tonsillar and adenoid hypertrophy     Patient Active Problem List   Diagnosis Date Noted  . Chest pain 01/22/2018  . Fatty liver 01/22/2018  . Rectal bleeding 06/15/2016  . FH: colon cancer 06/15/2016  . Constipation 06/15/2016    Past Surgical History:  Procedure Laterality Date  . BREAST REDUCTION SURGERY    . CESAREAN SECTION  10/05/2003  . CHOLECYSTECTOMY  12/01/2003  . COLONOSCOPY N/A 06/28/2016   Dr. Darrick PennaFields:  diverticulosis, hemorrhoids. [facility]  . TONSILLECTOMY    . TONSILLECTOMY AND ADENOIDECTOMY Bilateral 05/05/2014   Procedure: BILATERAL TONSILLECTOMY  W/  ABLATATION OF ADENOIDS;  Surgeon: Darletta MollSui W Teoh, MD;  Location: [facility];  Service: ENT;  Laterality: Bilateral;  . TUBAL LIGATION  01/08/2004     OB History    Gravida      Para      Term      Preterm      AB      Living  1     SAB      TAB      Ectopic      Multiple      Live Births               Home Medications    Prior to Admission medications   Medication Sig Start Date End Date Taking? Authorizing Provider  amLODipine (NORVASC) 10 MG tablet Take 10 mg by mouth daily.    [provider]  carvedilol (COREG) 25 MG tablet Take 25 mg by mouth 2 (two) times daily. 07/25/17   [provider]  doxycycline (VIBRAMYCIN) 100 MG capsule Take 1 capsule (100 mg total) by mouth 2 (two) times daily. 12/12/18   Liberty HandyGibbons, Chere Babson J, PA-C  lisinopril (PRINIVIL,ZESTRIL) 20 MG tablet Take 1 tablet by mouth daily. 07/25/17   [provider]  lisinopril-hydrochlorothiazide (PRINZIDE,ZESTORETIC) 20-25 MG tablet Take 1 tablet by mouth daily. Add to the lisinopril 20 mg    [provider]  oxyCODONE (OXY IR/ROXICODONE) 5 MG immediate release tablet Take 1 tablet (5 mg total) by mouth every 6 (six) hours as needed for up to 2 days for severe pain. 12/12/18 12/14/18  Liberty HandyGibbons, Ervin Rothbauer J, PA-C  pantoprazole (PROTONIX) 40 MG tablet Take 1 tablet (40 mg total) by mouth daily before breakfast. 01/22/18   Tiffany KocherLewis, Leslie S, PA-C    Family History Family History  Problem Relation Age of Onset  . Colon cancer Mother   . Heart attack Father   . Hypertension Brother     Social History Social History   Tobacco Use  . Smoking status: Never Smoker  . Smokeless tobacco: Never Used  Substance Use Topics  . Alcohol use: No  . Drug use: No     Allergies   Chocolate    Review of Systems Review of Systems  Skin: Positive for color change and wound.       Boil  All other systems reviewed and are negative.    Physical Exam Updated Vital Signs BP (!) 179/105 (BP Location: Right Arm)   Pulse (!) 118   Temp 98.2 F (36.8 C) (Oral)   Resp 16   Ht 5\' 2"  (1.575 m)   Wt 129.7 kg   LMP 12/11/2018   SpO2 100%   BMI 52.31 kg/m   Physical Exam Vitals signs and nursing note reviewed.  Constitutional:      General: She is not in acute distress.    Appearance: She is well-developed.     Comments: NAD.  Obese.  HENT:     Head: Normocephalic and atraumatic.     Right Ear: External ear normal.     Left Ear: External ear normal.     Nose: Nose normal.  Eyes:     Conjunctiva/sclera: Conjunctivae normal.  Neck:     Musculoskeletal: Normal range of motion and neck supple.  Cardiovascular:     Rate and Rhythm: Normal rate and regular rhythm.     Heart sounds: Normal heart sounds.  Pulmonary:     Effort: Pulmonary effort is normal.     Breath sounds: Normal breath sounds.  Musculoskeletal: Normal range of motion.  Skin:    General: Skin is warm and dry.     Capillary Refill: Capillary refill takes less than 2 seconds.     Comments: There is a focal area of fluctuance, erythema, edema, tenderness below the right axillary crease measuring approximately 5 cm x 2.5 cm.  There is surrounding induration, warmth and tenderness but no erythema with a total diameter of 10 cm x 6 cm circumferentially around the abscess.  Neurological:     Mental Status: She is alert and oriented to person, place, and time.  Psychiatric:        Behavior: Behavior normal.        Thought Content: Thought content normal.        Judgment: Judgment normal.      ED Treatments / Results  Labs (all labs ordered are listed, but only abnormal results are displayed) Labs Reviewed  CBC WITH DIFFERENTIAL/PLATELET - Abnormal; Notable for the following components:      Result Value    WBC 12.3 (*)    Hemoglobin 10.3 (*)    HCT 35.9 (*)    MCH 24.2 (*)    MCHC 28.7 (*)    RDW 17.0 (*)  Platelets 494 (*)    Neutro Abs 9.0 (*)    All other components within normal limits  BASIC METABOLIC PANEL - Abnormal; Notable for the following components:   Potassium 3.3 (*)    Calcium 8.5 (*)    All other components within normal limits  LACTIC ACID, PLASMA    EKG None  Radiology No results found.  Procedures .Marland Kitchen.Incision and Drainage  Date/Time: 12/12/2018 10:22 PM Performed by: Liberty HandyGibbons, Ashyah Quizon J, PA-C Authorized by: Liberty HandyGibbons, Hazael Olveda J, PA-C   Consent:    Consent obtained:  Verbal   Consent given by:  Patient   Risks discussed:  Bleeding, incomplete drainage, pain and damage to other organs   Alternatives discussed:  Alternative treatment Universal protocol:    Procedure explained and questions answered to patient or proxy's satisfaction: yes     Relevant documents present and verified: yes     Test results available and properly labeled: yes     Imaging studies available: yes     Required blood products, implants, devices, and special equipment available: yes     Site/side marked: yes     Immediately prior to procedure a time out was called: yes     Patient identity confirmed:  Verbally with patient and arm band Location:    Type:  Abscess   Size:  5 x 2.5   Location:  Upper extremity   Upper extremity location: R axilla. Anesthesia (see MAR for exact dosages):    Anesthesia method:  Local infiltration   Local anesthetic:  Lidocaine 2% WITH epi Procedure type:    Complexity:  Complex Procedure details:    Incision types:  Single straight   Incision depth:  Subcutaneous   Scalpel blade:  11   Wound management:  Probed and deloculated, irrigated with saline and extensive cleaning   Drainage:  Purulent   Drainage amount:  Copious   Packing materials:  1/4 in gauze Post-procedure details:    Patient tolerance of procedure:  Tolerated well, no immediate  complications Comments:     Abscess cavity was irrigated with 100 cc NS    (including critical care time)  Medications Ordered in ED Medications  acetaminophen (TYLENOL) tablet 650 mg (650 mg Oral Given 12/12/18 1956)  oxyCODONE-acetaminophen (PERCOCET/ROXICET) 5-325 MG per tablet 1 tablet (1 tablet Oral Given 12/12/18 1956)  lidocaine-EPINEPHrine (XYLOCAINE W/EPI) 2 %-1:200000 (PF) injection 20 mL (20 mLs Infiltration Given by Other 12/12/18 2033)  doxycycline (VIBRA-TABS) tablet 100 mg (100 mg Oral Given 12/12/18 2212)     Initial Impression / Assessment and Plan / ED Course  I have reviewed the triage vital signs and the nursing notes.  Pertinent labs & imaging results that were available during my care of the patient were reviewed by me and considered in my medical decision making (see chart for details).  Clinical Course as of Dec 11 2224  Thu Dec 12, 2018  2137 WBC(!): 12.3 [CG]  2137 Hemoglobin(!): 10.3 [CG]  2137 Potassium(!): 3.3 [CG]  2137 Lactic Acid, Venous: 0.7 [CG]  2137 Pulse Rate(!): 118 [CG]  2137 BP(!): 186/131 [CG]    Clinical Course User Index [CG] Liberty HandyGibbons, Vinicius Brockman J, PA-C   38 year old with history of obesity, poorly controlled HTN is here for right axillary abscess.  No systemic symptoms.  On arrival she is tachycardic and hypertensive but afebrile without tachypnea, hypoxemia.  Overall nontoxic-appearing.  I suspect tachycardia and hypertension is secondary to pain and chronic poorly controlled hypertension.  ER work-up reviewed by me  shows very mild leukocytosis WBC 12.3, no lactic acidosis.  Baseline anemia.  Normal creatinine.  K3.3.  Abscess was incised and drained by me.  There was copious amounts of purulent drainage with moderate improvement in edema after the procedure.  Abscess cavity was thoroughly irrigated and packing was placed.  No immediate complications after the procedure.  Although she has a very mild leukocytosis, initial tachycardia she  has no lactic acidosis and overall is well-appearing, nontoxic.  Given this, I do not think she needs emergent imaging, IV antibiotics or admission.  Patient was given first dose of antibiotic here.  Instructed importance of heat therapy, high-dose NSAIDs and doxycycline over the next 48 to 72 hours.  Given size of the surrounding induration I recommended she come to the ER in the next 72 hours for reevaluation of wound and packing removal.  At that time wound can be checked to see if there is clinical decline.  She understands symptoms that would warrant immediate return to the ER.  Patient is comfortable with the plan.  Final Clinical Impressions(s) / ED Diagnoses   Final diagnoses:  Abscess of axilla, right  Elevated blood pressure reading with diagnosis of hypertension    ED Discharge Orders         Ordered    doxycycline (VIBRAMYCIN) 100 MG capsule  2 times daily     12/12/18 2141    oxyCODONE (OXY IR/ROXICODONE) 5 MG immediate release tablet  Every 6 hours PRN     12/12/18 2141           Kinnie Feil, PA-C 12/12/18 2226    Margette Fast, MD 12/13/18 1247

## 2018-12-16 ENCOUNTER — Encounter (HOSPITAL_COMMUNITY): Payer: Self-pay | Admitting: Emergency Medicine

## 2018-12-16 ENCOUNTER — Other Ambulatory Visit: Payer: Self-pay

## 2018-12-16 ENCOUNTER — Emergency Department (HOSPITAL_COMMUNITY)
Admission: EM | Admit: 2018-12-16 | Discharge: 2018-12-16 | Disposition: A | Discharge status: Home / Self Care | Payer: Medicaid Other | Attending: Emergency Medicine | Admitting: Emergency Medicine

## 2018-12-16 DIAGNOSIS — Z5189 Encounter for other specified aftercare: Secondary | ICD-10-CM

## 2018-12-16 DIAGNOSIS — I10 Essential (primary) hypertension: Secondary | ICD-10-CM | POA: Insufficient documentation

## 2018-12-16 DIAGNOSIS — L02411 Cutaneous abscess of right axilla: Secondary | ICD-10-CM | POA: Diagnosis not present

## 2018-12-16 DIAGNOSIS — Z79899 Other long term (current) drug therapy: Secondary | ICD-10-CM | POA: Diagnosis not present

## 2018-12-16 NOTE — ED Triage Notes (Signed)
Pt reports abscess being drained from right axilla 3 days ago and is here for recheck. States it is getting better.

## 2018-12-16 NOTE — Discharge Instructions (Addendum)
Continue taking your antibiotic as directed until its finished.  Apply warm wet soaks or compresses on/off 2-3 times a day until the area heals.  Return here if needed

## 2018-12-18 NOTE — ED Provider Notes (Signed)
[facility] Provider Note   CSN: [id number] Arrival date & time: 12/16/18  1328     History   Chief Complaint Chief Complaint  Patient presents with  . Wound Check    HPI Denise Walters is a 38 y.o. female.     HPI   Denise Walters is a 38 y.o. female who presents to the Emergency Department requesting evaluation of a previously incised abscess to the right axilla.  She was here on 12/12/18 and had the abscess I&D'd.  She states the area is feeling better and she is currently taking the antibiotics that were prescribed.  She denies fever, increasing pain or increasing redness of the site.     Past Medical History:  Diagnosis Date  . Abscess   . Chronic headache   . Elevated lipids   . History of seizure 2010  . Hypertension   . Tonsillar and adenoid hypertrophy     Patient Active Problem List   Diagnosis Date Noted  . Chest pain 01/22/2018  . Fatty liver 01/22/2018  . Rectal bleeding 06/15/2016  . FH: colon cancer 06/15/2016  . Constipation 06/15/2016    Past Surgical History:  Procedure Laterality Date  . BREAST REDUCTION SURGERY    . CESAREAN SECTION  10/05/2003  . CHOLECYSTECTOMY  12/01/2003  . COLONOSCOPY N/A 06/28/2016   Dr. Darrick PennaFields: diverticulosis, hemorrhoids. [facility]  . TONSILLECTOMY    . TONSILLECTOMY AND ADENOIDECTOMY Bilateral 05/05/2014   Procedure: BILATERAL TONSILLECTOMY  W/  ABLATATION OF ADENOIDS;  Surgeon: Darletta MollSui W Teoh, MD;  Location: [facility];  Service: ENT;  Laterality: Bilateral;  . TUBAL LIGATION  01/08/2004     OB History    Gravida      Para      Term      Preterm      AB      Living  1     SAB      TAB      Ectopic      Multiple      Live Births               Home Medications    Prior to Admission medications   Medication Sig Start Date End Date Taking? Authorizing Provider  amLODipine (NORVASC) 10 MG tablet Take 10 mg by mouth daily.    [provider]  carvedilol (COREG) 25 MG tablet Take 25 mg by mouth 2 (two) times daily. 07/25/17   [provider]  doxycycline (VIBRAMYCIN) 100 MG capsule Take 1 capsule (100 mg total) by mouth 2 (two) times daily. 12/12/18   Liberty HandyGibbons, Claudia J, PA-C  lisinopril (PRINIVIL,ZESTRIL) 20 MG tablet Take 1 tablet by mouth daily. 07/25/17   [provider]  lisinopril-hydrochlorothiazide (PRINZIDE,ZESTORETIC) 20-25 MG tablet Take 1 tablet by mouth daily. Add to the lisinopril 20 mg    [provider]  pantoprazole (PROTONIX) 40 MG tablet Take 1 tablet (40 mg total) by mouth daily before breakfast. 01/22/18   Tiffany KocherLewis, Leslie S, PA-C    Family History Family History  Problem Relation Age of Onset  . Colon cancer Mother   . Heart attack Father   . Hypertension Brother     Social History Social History   Tobacco Use  . Smoking status: Never Smoker  . Smokeless tobacco: Never Used  Substance Use Topics  . Alcohol use: No  . Drug use: No  Allergies   Chocolate   Review of Systems Review of Systems  Constitutional: Negative for chills and fever.  Gastrointestinal: Negative for nausea and vomiting.  Musculoskeletal: Negative for arthralgias and myalgias.  Skin: Negative for color change.       Abscess right axilla with packing present.  Hematological: Negative for adenopathy.     Physical Exam Updated Vital Signs BP 129/88   Pulse 95   Temp 98.1 F (36.7 C) (Temporal)   Resp 18   Ht 5\' 2"  (1.575 m)   Wt 129.7 kg   LMP 12/11/2018   SpO2 96%   BMI 52.31 kg/m   Physical Exam Vitals signs and nursing note reviewed.  Constitutional:      General: She is not in acute distress.    Appearance: Normal appearance. She is well-developed. She is not ill-appearing or toxic-appearing.  HENT:     Head: Atraumatic.  Neck:     Musculoskeletal: Normal range of motion. No muscular tenderness.  Cardiovascular:     Rate and Rhythm: Normal rate and regular  rhythm.     Heart sounds: Normal heart sounds. No murmur.  Pulmonary:     Effort: No respiratory distress.     Breath sounds: Normal breath sounds.  Chest:     Chest wall: No tenderness.  Musculoskeletal:        General: No swelling.  Lymphadenopathy:     Cervical: No cervical adenopathy.  Skin:    General: Skin is warm.     Capillary Refill: Capillary refill takes less than 2 seconds.     Findings: No erythema.     Comments: Abscess to the right axilla with previous incision and drainage.  There is packing present.  No surrounding erythema or continued drainage.  Appears to be healing well  Neurological:     General: No focal deficit present.     Mental Status: She is alert.     Sensory: No sensory deficit.     Motor: No weakness or abnormal muscle tone.      ED Treatments / Results  Labs (all labs ordered are listed, but only abnormal results are displayed) Labs Reviewed - No data to display  EKG None  Radiology No results found.  Procedures Procedures (including critical care time)  Previous I&D right axilla.  Packing present, manually removed by me w/o difficulty.  Pink underlying tissue, no purulence present.  Appears to be healing well.    Medications Ordered in ED Medications - No data to display   Initial Impression / Assessment and Plan / ED Course  I have reviewed the triage vital signs and the nursing notes.  Pertinent labs & imaging results that were available during my care of the patient were reviewed by me and considered in my medical decision making (see chart for details).       Pt here for recheck of abscess and packing removal.  Area healing well.  Pt currently taking abx, she agrees to warm wet compresses.  Return precautions discussed.    Final Clinical Impressions(s) / ED Diagnoses   Final diagnoses:  Abscess re-check    ED Discharge Orders    None       Kem Parkinson, PA-C 12/18/18 1418    Milton Ferguson, MD 12/29/18 [phone]

## 2019-05-03 ENCOUNTER — Emergency Department (HOSPITAL_COMMUNITY)
Admission: EM | Admit: 2019-05-03 | Discharge: 2019-05-03 | Disposition: A | Discharge status: Home / Self Care | Payer: Medicaid Other | Attending: Emergency Medicine | Admitting: Emergency Medicine

## 2019-05-03 ENCOUNTER — Other Ambulatory Visit: Payer: Self-pay

## 2019-05-03 ENCOUNTER — Emergency Department (HOSPITAL_COMMUNITY): Payer: Medicaid Other

## 2019-05-03 ENCOUNTER — Encounter (HOSPITAL_COMMUNITY): Payer: Self-pay

## 2019-05-03 DIAGNOSIS — I10 Essential (primary) hypertension: Secondary | ICD-10-CM | POA: Diagnosis not present

## 2019-05-03 DIAGNOSIS — Z23 Encounter for immunization: Secondary | ICD-10-CM | POA: Insufficient documentation

## 2019-05-03 DIAGNOSIS — S81011A Laceration without foreign body, right knee, initial encounter: Secondary | ICD-10-CM | POA: Insufficient documentation

## 2019-05-03 DIAGNOSIS — Y999 Unspecified external cause status: Secondary | ICD-10-CM | POA: Insufficient documentation

## 2019-05-03 DIAGNOSIS — Z79899 Other long term (current) drug therapy: Secondary | ICD-10-CM | POA: Diagnosis not present

## 2019-05-03 DIAGNOSIS — W19XXXA Unspecified fall, initial encounter: Secondary | ICD-10-CM

## 2019-05-03 DIAGNOSIS — Y929 Unspecified place or not applicable: Secondary | ICD-10-CM | POA: Insufficient documentation

## 2019-05-03 DIAGNOSIS — W109XXA Fall (on) (from) unspecified stairs and steps, initial encounter: Secondary | ICD-10-CM | POA: Diagnosis not present

## 2019-05-03 DIAGNOSIS — Y9301 Activity, walking, marching and hiking: Secondary | ICD-10-CM | POA: Diagnosis not present

## 2019-05-03 MED ORDER — TETANUS-DIPHTH-ACELL PERTUSSIS 5-2.5-18.5 LF-MCG/0.5 IM SUSP
0.5000 mL | Freq: Once | INTRAMUSCULAR | Status: AC
  Administered 2019-05-03: 11:00:00 0.5 mL via INTRAMUSCULAR
  Filled 2019-05-03: qty 0.5

## 2019-05-03 MED ORDER — LIDOCAINE-EPINEPHRINE (PF) 2 %-1:200000 IJ SOLN
10.0000 mL | Freq: Once | INTRAMUSCULAR | Status: AC
  Administered 2019-05-03: 10 mL
  Filled 2019-05-03: qty 10

## 2019-05-03 NOTE — ED Triage Notes (Signed)
Pt reports slipping and falling on right knee. Pt has laceration to right knee. No bleeding noted

## 2019-05-03 NOTE — ED Provider Notes (Signed)
[facility] EMERGENCY DEPARTMENT Provider Note   CSN: [id number] Arrival date & time: 05/03/19  1761     History Chief Complaint  Patient presents with  . Fall  . Laceration    Denise Walters is a 39 y.o. female with a history of hypertension, seizures, and fatty liver disease who presents to the emergency department with complaints of right knee injury which occurred shortly prior to arrival this AM.  Patient states she was walking on some steps when her sandals slipped and she fell onto her knees.  Denies head injury or loss of consciousness.  States she is having pain with a laceration to the right anterior knee.  No alleviating or aggravating factors.  No intervention prior to arrival.  Denies any other significant areas of injury.  Denies neck pain, back pain, headache, numbness, tingling, weakness, fever, or chills.  Unknown last tetanus.  HPI     Past Medical History:  Diagnosis Date  . Abscess   . Chronic headache   . Elevated lipids   . History of seizure 2010  . Hypertension   . Seizures (HCC)   . Tonsillar and adenoid hypertrophy     Patient Active Problem List   Diagnosis Date Noted  . Chest pain 01/22/2018  . Fatty liver 01/22/2018  . Rectal bleeding 06/15/2016  . FH: colon cancer 06/15/2016  . Constipation 06/15/2016    Past Surgical History:  Procedure Laterality Date  . BREAST REDUCTION SURGERY    . CESAREAN SECTION  10/05/2003  . CHOLECYSTECTOMY  12/01/2003  . COLONOSCOPY N/A 06/28/2016   Dr. Darrick Penna: diverticulosis, hemorrhoids. next tcs in 06/2021 due to [facility] CRC.  . TONSILLECTOMY    . TONSILLECTOMY AND ADENOIDECTOMY Bilateral 05/05/2014   Procedure: BILATERAL TONSILLECTOMY  W/  ABLATATION OF ADENOIDS;  Surgeon: Darletta Moll, MD;  Location: [facility];  Service: ENT;  Laterality: Bilateral;  . TUBAL LIGATION  01/08/2004     OB History    Gravida      Para      Term      Preterm      AB      Living  1     SAB      TAB      Ectopic      Multiple      Live Births              Family History  Problem Relation Age of Onset  . Colon cancer Mother   . Heart attack Father   . Hypertension Brother     Social History   Tobacco Use  . Smoking status: Never Smoker  . Smokeless tobacco: Never Used  Substance Use Topics  . Alcohol use: No  . Drug use: No    Home Medications Prior to Admission medications   Medication Sig Start Date End Date Taking? Authorizing Provider  amLODipine (NORVASC) 10 MG tablet Take 10 mg by mouth daily.    [provider]  carvedilol (COREG) 25 MG tablet Take 25 mg by mouth 2 (two) times daily. 07/25/17   [provider]  doxycycline (VIBRAMYCIN) 100 MG capsule Take 1 capsule (100 mg total) by mouth 2 (two) times daily. 12/12/18   Liberty Handy, PA-C  lisinopril (PRINIVIL,ZESTRIL) 20 MG tablet Take 1 tablet by mouth daily. 07/25/17   [provider]  lisinopril-hydrochlorothiazide (PRINZIDE,ZESTORETIC) 20-25 MG tablet Take 1 tablet by mouth daily. Add to the lisinopril 20 mg  [provider]  pantoprazole (PROTONIX) 40 MG tablet Take 1 tablet (40 mg total) by mouth daily before breakfast. 01/22/18   Mahala Menghini, PA-C    Allergies    Chocolate  Review of Systems   Review of Systems  Constitutional: Negative for chills and fever.  Respiratory: Negative for shortness of breath.   Cardiovascular: Negative for chest pain.  Gastrointestinal: Negative for abdominal pain and vomiting.  Musculoskeletal: Positive for arthralgias. Negative for back pain and neck pain.  Skin: Positive for wound.  Neurological: Negative for dizziness, weakness, light-headedness, numbness and headaches.    Physical Exam Updated Vital Signs BP (!) 161/94 (BP Location: Right Arm)   Pulse 92   Temp 98.4 F (36.9 C) (Oral)   Resp 16   Ht 5\' 2"  (1.575 m)   Wt 136.1 kg   LMP 04/18/2019   SpO2 98%   BMI 54.87 kg/m   Physical Exam Vitals and nursing  note reviewed.  Constitutional:      General: She is not in acute distress.    Appearance: She is well-developed. She is not ill-appearing or toxic-appearing.  HENT:     Head: Normocephalic and atraumatic. No raccoon eyes or Battle's sign.     Right Ear: No hemotympanum.     Left Ear: No hemotympanum.  Eyes:     General:        Right eye: No discharge.        Left eye: No discharge.     Conjunctiva/sclera: Conjunctivae normal.     Pupils: Pupils are equal, round, and reactive to light.  Cardiovascular:     Rate and Rhythm: Normal rate and regular rhythm.     Pulses:          Dorsalis pedis pulses are 2+ on the right side and 2+ on the left side.       Posterior tibial pulses are 2+ on the right side and 2+ on the left side.     Heart sounds: No murmur.  Pulmonary:     Effort: Pulmonary effort is normal. No respiratory distress.     Breath sounds: Normal breath sounds. No wheezing or rales.  Abdominal:     General: There is no distension.     Palpations: Abdomen is soft.     Tenderness: There is no abdominal tenderness.  Musculoskeletal:     Cervical back: No spinous process tenderness.     Comments: Upper extremities: Intact active range of motion.  No point/focal tenderness. Back: No midline tenderness. Lower extremities: 5 cm linear laceration occurring diagonally to the right anterior knee, proximal portion is superficial, distal 3 cm portion is mildly gaping, about 2 mm deep no active bleeding.. Patient has intact AROM to bilateral hips, knees, ankles, and all digits. Tender to palpation to the anterior right knee.  Otherwise nontender.  Skin:    General: Skin is warm and dry.     Capillary Refill: Capillary refill takes less than 2 seconds.     Findings: No rash.  Neurological:     Mental Status: She is alert.     Comments: Alert. Clear speech. Sensation grossly intact to bilateral lower extremities. 5/5 strength with plantar/dorsiflexion bilaterally. Patient ambulatory.     Psychiatric:        Mood and Affect: Mood normal.        Behavior: Behavior normal.     ED Results / Procedures / Treatments   Labs (all labs ordered are listed, but only abnormal  results are displayed) Labs Reviewed - No data to display  EKG None  Radiology No results found.  Procedures .Marland KitchenLaceration Repair  Date/Time: 05/03/2019 11:29 AM Performed by: Cherly Anderson, PA-C Authorized by: Cherly Anderson, PA-C   Consent:    Consent obtained:  Verbal   Consent given by:  Patient   Risks discussed:  Infection, need for additional repair, nerve damage, poor wound healing, poor cosmetic result, pain, retained foreign body, tendon damage and vascular damage   Alternatives discussed:  No treatment and observation Anesthesia (see MAR for exact dosages):    Anesthesia method:  Local infiltration   Local anesthetic:  Lidocaine 2% WITH epi Laceration details:    Location:  Leg   Leg location:  R knee   Length (cm):  4   Depth (mm):  2 Repair type:    Repair type:  Simple Pre-procedure details:    Preparation:  Patient was prepped and draped in usual sterile fashion and imaging obtained to evaluate for foreign bodies Exploration:    Hemostasis achieved with:  Direct pressure   Wound exploration: wound explored through full range of motion and entire depth of wound probed and visualized   Treatment:    Area cleansed with:  Betadine   Amount of cleaning:  Standard   Irrigation solution:  Sterile water   Irrigation volume:  1L   Irrigation method:  Pressure wash   Visualized foreign bodies/material removed: no   Skin repair:    Repair method:  Sutures   Suture size:  4-0   Suture material:  Nylon   Suture technique:  Simple interrupted   Number of sutures:  3 Approximation:    Approximation:  Close Post-procedure details:    Dressing:  Non-adherent dressing   Patient tolerance of procedure:  Tolerated well, no immediate complications   (including  critical care time)  Medications Ordered in ED Medications  lidocaine-EPINEPHrine (XYLOCAINE W/EPI) 2 %-1:200000 (PF) injection 10 mL (10 mLs Infiltration Given 05/03/19 1137)  Tdap (BOOSTRIX) injection 0.5 mL (0.5 mLs Intramuscular Given 05/03/19 1109)    ED Course  I have reviewed the triage vital signs and the nursing notes.  Pertinent labs & imaging results that were available during my care of the patient were reviewed by me and considered in my medical decision making (see chart for details).    MDM Rules/Calculators/A&P                      Patient presents to the emergency department with laceration to right knee that occurred shortly PTA. Patient nontoxic appearing, resting comfortably. X-ray obtained in area of laceration, no fractures/dislocations or apparent radiopaque foreign bodies. Pressure irrigation performed. Wound explored and base of wound visualized in a bloodless field without evidence of foreign body. Laceration repair per procedure note above, tolerated well. Tetanus updated at today's visit. Ace wrap applied. Discussed suture home care as well as need for wound recheck and suture removal in 10 days.  I discussed results, treatment plan, need for follow-up, and return precautions with the patient including signs of infection. Provided opportunity for questions, patient confirmed understanding and is in agreement with plan.     Final Clinical Impression(s) / ED Diagnoses Final diagnoses:  Fall, initial encounter  Laceration of right knee, initial encounter    Rx / DC Orders ED Discharge Orders    None       Cherly Anderson, PA-C 05/03/19 1149    Vanetta Mulders, MD  05/16/19 1519  

## 2019-05-03 NOTE — Discharge Instructions (Addendum)
You were seen in the emergency department today for a laceration. Your laceration was closed with 3 stitches. Please keep this area clean and dry for the next 24 hours, after 24 hours you may get this area wet, but avoid soaking the area. Keep the area covered as best possible especially when in the sun to help in minimizing scarring.   Your tetanus has been updated  Your xray was normal.  Please keep the ace wrap on for the next few days to avoid excess bending of the knee as this can irritate the wound.   You will need to have the stitches removed and the wound rechecked in 10 days. Please return to the emergency department, go to an urgent care, or see your primary care provider to have this performed. Return to the ER soon should you start to experience pus type drainage from the wound, redness around the wound, or fevers as this could indicate the area is infected, please return to the ER for any other worsening symptoms or concerns that you may have.   Additional Information:  Your vital signs today were: Vitals:   05/03/19 0959  BP: (!) 161/94  Pulse: 92  Resp: 16  Temp: 98.4 F (36.9 C)  SpO2: 98%     If your blood pressure (BP) was elevated above 135/85 this visit, please have this repeated by your doctor within one month -----------------------------------------------------

## 2019-05-03 NOTE — ED Notes (Signed)
Sam PA in to repair

## 2019-05-13 ENCOUNTER — Encounter (HOSPITAL_COMMUNITY): Payer: Self-pay | Admitting: Emergency Medicine

## 2019-05-13 ENCOUNTER — Other Ambulatory Visit: Payer: Self-pay

## 2019-05-13 ENCOUNTER — Emergency Department (HOSPITAL_COMMUNITY)
Admission: EM | Admit: 2019-05-13 | Discharge: 2019-05-13 | Disposition: A | Discharge status: Home / Self Care | Payer: Medicaid Other | Attending: Emergency Medicine | Admitting: Emergency Medicine

## 2019-05-13 DIAGNOSIS — X58XXXD Exposure to other specified factors, subsequent encounter: Secondary | ICD-10-CM | POA: Insufficient documentation

## 2019-05-13 DIAGNOSIS — I10 Essential (primary) hypertension: Secondary | ICD-10-CM | POA: Insufficient documentation

## 2019-05-13 DIAGNOSIS — Z79899 Other long term (current) drug therapy: Secondary | ICD-10-CM | POA: Diagnosis not present

## 2019-05-13 DIAGNOSIS — Z4802 Encounter for removal of sutures: Secondary | ICD-10-CM

## 2019-05-13 DIAGNOSIS — S81011D Laceration without foreign body, right knee, subsequent encounter: Secondary | ICD-10-CM | POA: Diagnosis present

## 2019-05-13 NOTE — ED Triage Notes (Signed)
Suture removal from RT knee

## 2019-05-13 NOTE — Discharge Instructions (Addendum)
Return if any problems.

## 2019-05-13 NOTE — ED Provider Notes (Addendum)
[facility] EMERGENCY DEPARTMENT Provider Note   CSN: [id number] Arrival date & time: 05/13/19  1216     History Chief Complaint  Patient presents with  . Suture / Staple Removal    Denise Walters is a 39 y.o. female.  The history is provided by the patient. No language interpreter was used.  Suture / Staple Removal This is a new problem. The current episode started more than 1 week ago. The problem has not changed since onset.Nothing aggravates the symptoms. Nothing relieves the symptoms. She has tried nothing for the symptoms. The treatment provided no relief.       Past Medical History:  Diagnosis Date  . Abscess   . Chronic headache   . Elevated lipids   . History of seizure 2010  . Hypertension   . Seizures ([place])   . Tonsillar and adenoid hypertrophy     Patient Active Problem List   Diagnosis Date Noted  . Chest pain 01/22/2018  . Fatty liver 01/22/2018  . Rectal bleeding 06/15/2016  . FH: colon cancer 06/15/2016  . Constipation 06/15/2016    Past Surgical History:  Procedure Laterality Date  . BREAST REDUCTION SURGERY    . CESAREAN SECTION  10/05/2003  . CHOLECYSTECTOMY  12/01/2003  . COLONOSCOPY N/A 06/28/2016   Dr. Oneida Alar: diverticulosis, hemorrhoids. next tcs in 06/2021 due to [facility] CRC.  . TONSILLECTOMY    . TONSILLECTOMY AND ADENOIDECTOMY Bilateral 05/05/2014   Procedure: BILATERAL TONSILLECTOMY  W/  ABLATATION OF ADENOIDS;  Surgeon: Ascencion Dike, MD;  Location: [place];  Service: ENT;  Laterality: Bilateral;  . TUBAL LIGATION  01/08/2004     OB History    Gravida      Para      Term      Preterm      AB      Living  1     SAB      TAB      Ectopic      Multiple      Live Births              Family History  Problem Relation Age of Onset  . Colon cancer Mother   . Heart attack Father   . Hypertension Brother     Social History   Tobacco Use  . Smoking status: Never Smoker  . Smokeless tobacco: Never Used   Substance Use Topics  . Alcohol use: No  . Drug use: No    Home Medications Prior to Admission medications   Medication Sig Start Date End Date Taking? Authorizing Provider  amLODipine (NORVASC) 10 MG tablet Take 10 mg by mouth daily.    [provider]  carvedilol (COREG) 25 MG tablet Take 25 mg by mouth 2 (two) times daily. 07/25/17   [provider]  doxycycline (VIBRAMYCIN) 100 MG capsule Take 1 capsule (100 mg total) by mouth 2 (two) times daily. 12/12/18   Kinnie Feil, PA-C  lisinopril (PRINIVIL,ZESTRIL) 20 MG tablet Take 1 tablet by mouth daily. 07/25/17   [provider]  lisinopril-hydrochlorothiazide (PRINZIDE,ZESTORETIC) 20-25 MG tablet Take 1 tablet by mouth daily. Add to the lisinopril 20 mg    [provider]  pantoprazole (PROTONIX) 40 MG tablet Take 1 tablet (40 mg total) by mouth daily before breakfast. 01/22/18   Mahala Menghini, PA-C    Allergies    Chocolate  Review of Systems   Review of Systems  All other systems reviewed  and are negative.   Physical Exam Updated Vital Signs BP (!) 160/120 (BP Location: Right Wrist)   Pulse 95   Temp 99 F (37.2 C) (Oral)   Resp 19   Ht 5\' 2"  (1.575 m)   Wt 136 kg   LMP 04/18/2019   SpO2 100%   BMI 54.84 kg/m   Physical Exam Vitals reviewed.  Skin:    General: Skin is warm.     Comments: Well healed laceration right knee,    Neurological:     General: No focal deficit present.  Psychiatric:        Mood and Affect: Mood normal.     ED Results / Procedures / Treatments   Labs (all labs ordered are listed, but only abnormal results are displayed) Labs Reviewed - No data to display  EKG None  Radiology No results found.  Procedures Procedures (including critical care time)  Medications Ordered in ED Medications - No data to display  ED Course  I have reviewed the triage vital signs and the nursing notes.  Pertinent labs & imaging results that were available  during my care of the patient were reviewed by me and considered in my medical decision making (see chart for details).    MDM Rules/Calculators/A&P                      MDM  #3 sutures removed.  Final Clinical Impression(s) / ED Diagnoses Final diagnoses:  Visit for suture removal    Rx / DC Orders ED Discharge Orders    None    An After Visit Summary was printed and given to the patient.    04/20/2019, PA-C 05/13/19 1333    05/15/19, Elson Areas 05/13/19 1333    05/15/19, MD 05/15/19 [phone]

## 2020-02-11 ENCOUNTER — Other Ambulatory Visit: Payer: Self-pay

## 2020-02-11 ENCOUNTER — Encounter (HOSPITAL_COMMUNITY): Payer: Self-pay | Admitting: Emergency Medicine

## 2020-02-11 ENCOUNTER — Emergency Department (HOSPITAL_COMMUNITY)
Admission: EM | Admit: 2020-02-11 | Discharge: 2020-02-11 | Disposition: A | Discharge status: Home / Self Care | Payer: Medicaid Other | Attending: Emergency Medicine | Admitting: Emergency Medicine

## 2020-02-11 DIAGNOSIS — Z79899 Other long term (current) drug therapy: Secondary | ICD-10-CM | POA: Diagnosis not present

## 2020-02-11 DIAGNOSIS — R0989 Other specified symptoms and signs involving the circulatory and respiratory systems: Secondary | ICD-10-CM | POA: Insufficient documentation

## 2020-02-11 DIAGNOSIS — I10 Essential (primary) hypertension: Secondary | ICD-10-CM | POA: Diagnosis not present

## 2020-02-11 DIAGNOSIS — K219 Gastro-esophageal reflux disease without esophagitis: Secondary | ICD-10-CM | POA: Diagnosis not present

## 2020-02-11 DIAGNOSIS — K2960 Other gastritis without bleeding: Secondary | ICD-10-CM

## 2020-02-11 MED ORDER — LIDOCAINE VISCOUS HCL 2 % MT SOLN
15.0000 mL | Freq: Once | OROMUCOSAL | Status: AC
  Administered 2020-02-11: 15 mL via ORAL
  Filled 2020-02-11: qty 15

## 2020-02-11 MED ORDER — ALUM & MAG HYDROXIDE-SIMETH 200-200-20 MG/5ML PO SUSP
30.0000 mL | Freq: Once | ORAL | Status: AC
  Administered 2020-02-11: 30 mL via ORAL
  Filled 2020-02-11: qty 30

## 2020-02-11 MED ORDER — PANTOPRAZOLE SODIUM 40 MG PO TBEC: 40.0000 mg | DELAYED_RELEASE_TABLET | Freq: Every day | ORAL | 3 refills | Status: DC

## 2020-02-11 NOTE — ED Triage Notes (Signed)
Pt took last antibiotic pill last night about 5 pm and pill is stuck in throat.  Rates pain 7/10.

## 2020-02-11 NOTE — ED Notes (Signed)
Pt given cola per PA.  Instructed to take small sips.

## 2020-02-11 NOTE — Discharge Instructions (Addendum)
Return if any problems.  See your Physician for recheck  °

## 2020-02-11 NOTE — ED Provider Notes (Signed)
[facility] EMERGENCY DEPARTMENT Provider Note   CSN: [id number] Arrival date & time: 02/11/20  1437     History Chief Complaint  Patient presents with  . Foreign Body    throat    Denise Walters is a 39 y.o. female.  The history is provided by the patient. No language interpreter was used.  Foreign Body Location:  Swallowed Pain quality:  Aching Pain severity:  No pain Timing:  Constant Worsened by:  Drinking  Pt reports she took a pill and it stuck in her throat.  Pt reports it was an antibiotic capsule. Pt swallowed it yesterday.  Pt is able to swallow her saliva     Past Medical History:  Diagnosis Date  . Abscess   . Chronic headache   . Elevated lipids   . History of seizure 2010  . Hypertension   . Seizures (HCC)   . Tonsillar and adenoid hypertrophy     Patient Active Problem List   Diagnosis Date Noted  . Chest pain 01/22/2018  . Fatty liver 01/22/2018  . Rectal bleeding 06/15/2016  . FH: colon cancer 06/15/2016  . Constipation 06/15/2016    Past Surgical History:  Procedure Laterality Date  . BREAST REDUCTION SURGERY    . CESAREAN SECTION  10/05/2003  . CHOLECYSTECTOMY  12/01/2003  . COLONOSCOPY N/A 06/28/2016   Dr. Darrick Penna: diverticulosis, hemorrhoids. next tcs in 06/2021 due to [facility] CRC.  . TONSILLECTOMY    . TONSILLECTOMY AND ADENOIDECTOMY Bilateral 05/05/2014   Procedure: BILATERAL TONSILLECTOMY  W/  ABLATATION OF ADENOIDS;  Surgeon: Darletta Moll, MD;  Location: [facility];  Service: ENT;  Laterality: Bilateral;  . TUBAL LIGATION  01/08/2004     OB History    Gravida      Para      Term      Preterm      AB      Living  1     SAB      TAB      Ectopic      Multiple      Live Births              Family History  Problem Relation Age of Onset  . Colon cancer Mother   . Heart attack Father   . Hypertension Brother     Social History   Tobacco Use  . Smoking status: Never Smoker  . Smokeless tobacco:  Never Used  Vaping Use  . Vaping Use: Never used  Substance Use Topics  . Alcohol use: No  . Drug use: No    Home Medications Prior to Admission medications   Medication Sig Start Date End Date Taking? Authorizing Provider  amLODipine (NORVASC) 10 MG tablet Take 10 mg by mouth daily.    [provider]  carvedilol (COREG) 25 MG tablet Take 25 mg by mouth 2 (two) times daily. 07/25/17   [provider]  doxycycline (VIBRAMYCIN) 100 MG capsule Take 1 capsule (100 mg total) by mouth 2 (two) times daily. 12/12/18   Liberty Handy, PA-C  lisinopril (PRINIVIL,ZESTRIL) 20 MG tablet Take 1 tablet by mouth daily. 07/25/17   [provider]  lisinopril-hydrochlorothiazide (PRINZIDE,ZESTORETIC) 20-25 MG tablet Take 1 tablet by mouth daily. Add to the lisinopril 20 mg    [provider]  pantoprazole (PROTONIX) 40 MG tablet Take 1 tablet (40 mg total) by mouth daily before breakfast. 02/11/20   Elson Areas, PA-C  Allergies    Chocolate  Review of Systems   Review of Systems  All other systems reviewed and are negative.   Physical Exam Updated Vital Signs BP (!) 148/109   Pulse (!) 102   Temp (!) 97.2 F (36.2 C) (Oral)   Resp 20   Ht 5\' 2"  (1.575 m)   Wt 127.9 kg   LMP 01/26/2020 (Exact Date)   SpO2 100%   BMI 51.58 kg/m   Physical Exam Vitals and nursing note reviewed.  Constitutional:      Appearance: She is well-developed.  HENT:     Head: Normocephalic.  Cardiovascular:     Rate and Rhythm: Normal rate.  Pulmonary:     Effort: Pulmonary effort is normal.  Abdominal:     General: There is no distension.  Musculoskeletal:        General: Normal range of motion.     Cervical back: Normal range of motion.  Skin:    General: Skin is warm.  Neurological:     General: No focal deficit present.     Mental Status: She is alert and oriented to person, place, and time.  Psychiatric:        Mood and Affect: Mood normal.     ED  Results / Procedures / Treatments   Labs (all labs ordered are listed, but only abnormal results are displayed) Labs Reviewed - No data to display  EKG None  Radiology No results found.  Procedures Procedures (including critical care time)  Medications Ordered in ED Medications  alum & mag hydroxide-simeth (MAALOX/MYLANTA) 200-200-20 MG/5ML suspension 30 mL (30 mLs Oral Given 02/11/20 1654)    And  lidocaine (XYLOCAINE) 2 % viscous mouth solution 15 mL (15 mLs Oral Given 02/11/20 1654)    ED Course  I have reviewed the triage vital signs and the nursing notes.  Pertinent labs & imaging results that were available during my care of the patient were reviewed by me and considered in my medical decision making (see chart for details).    MDM Rules/Calculators/A&P                          MDM:  Pt able to eat.  Pt drinks soda.  Pt reports some relief with Gi cocktail.   Final Clinical Impression(s) / ED Diagnoses Final diagnoses:  Foreign body sensation in throat  Reflux gastritis    Rx / DC Orders ED Discharge Orders         Ordered    pantoprazole (PROTONIX) 40 MG tablet  Daily before breakfast        02/11/20 1718        An After Visit Summary was printed and given to the patient.    02/13/20, PA-C 02/11/20 02/13/20    Vinnie Langton, MD 02/13/20 [phone]

## 2020-03-15 IMAGING — DX DG CHEST 2V
2 series · 2 of 2 positions shown · non-contrast
Comparison: Two-view chest x-ray 11/04/2015

CLINICAL DATA: Mid chest pain for 3 days.

EXAM:
CHEST - 2 VIEW

[chest pa]
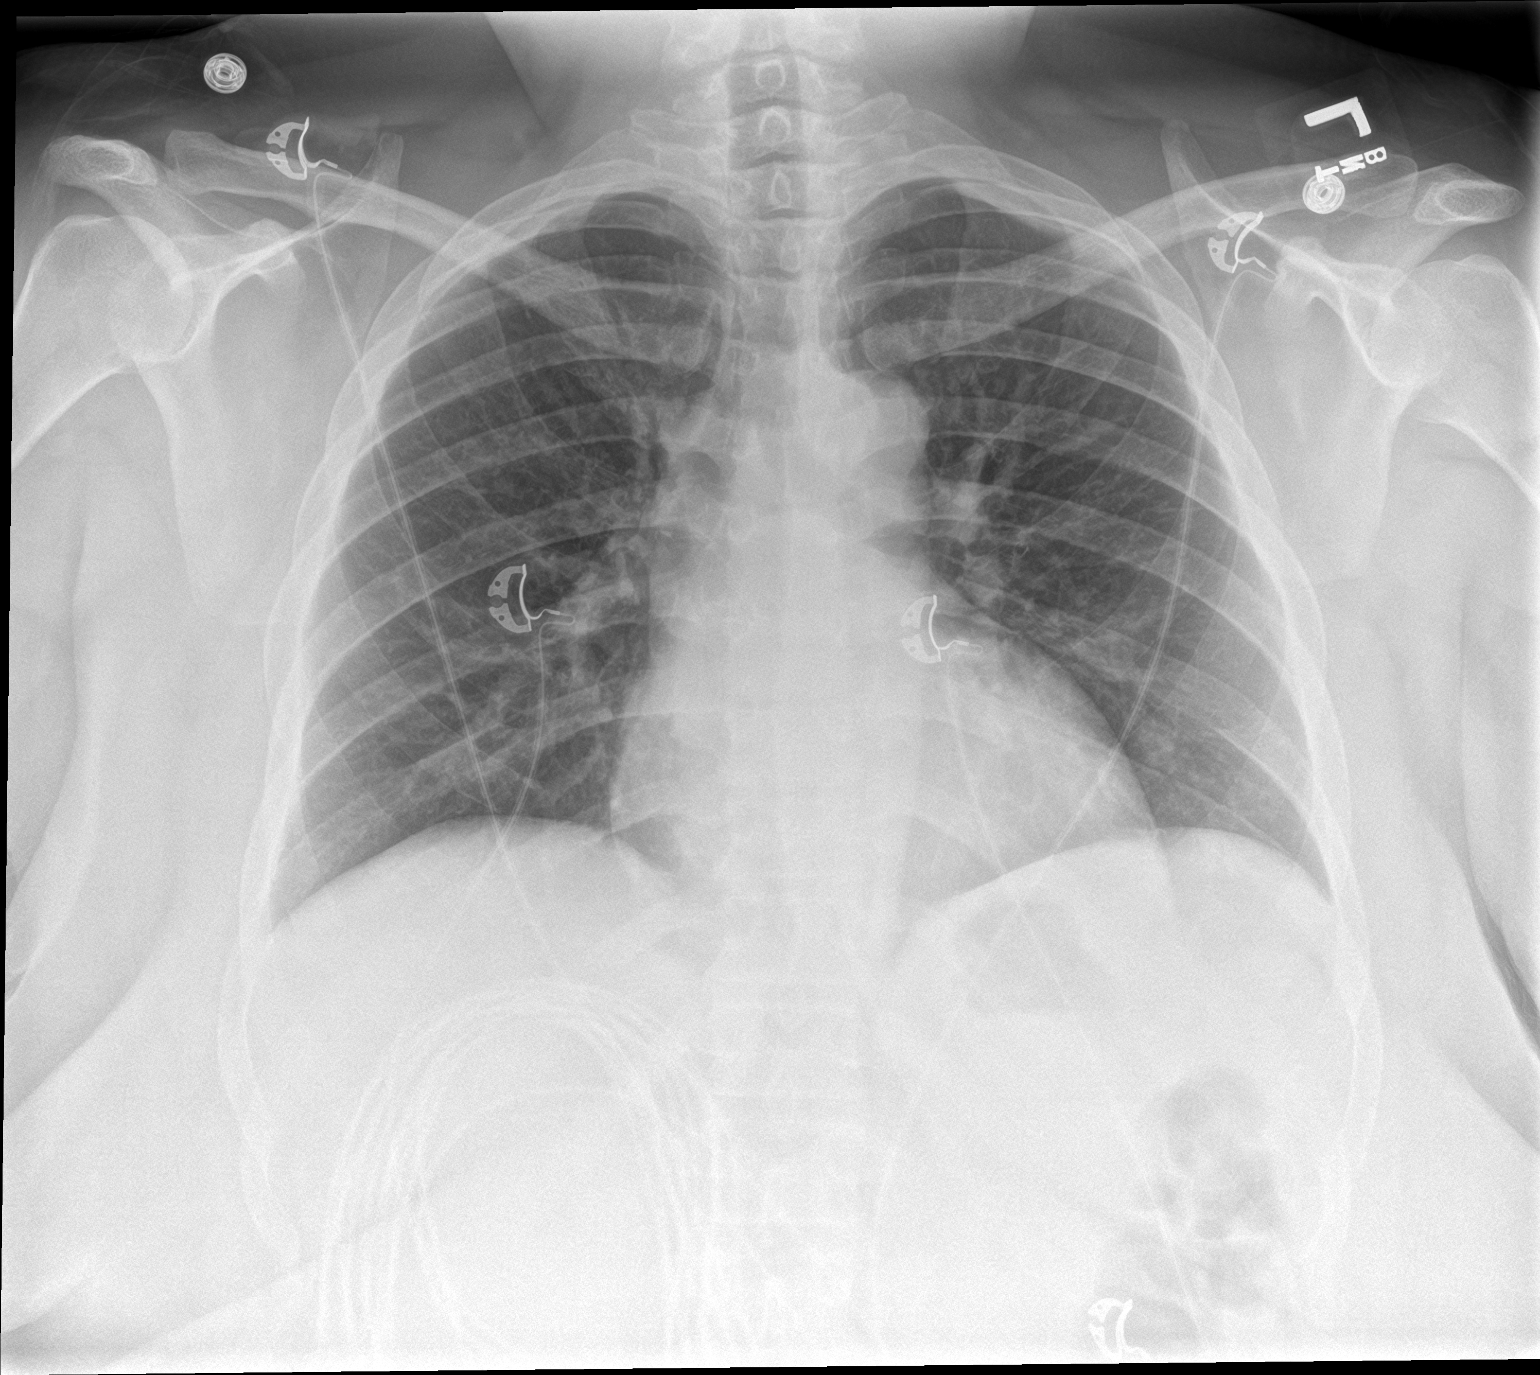

[chest lat]
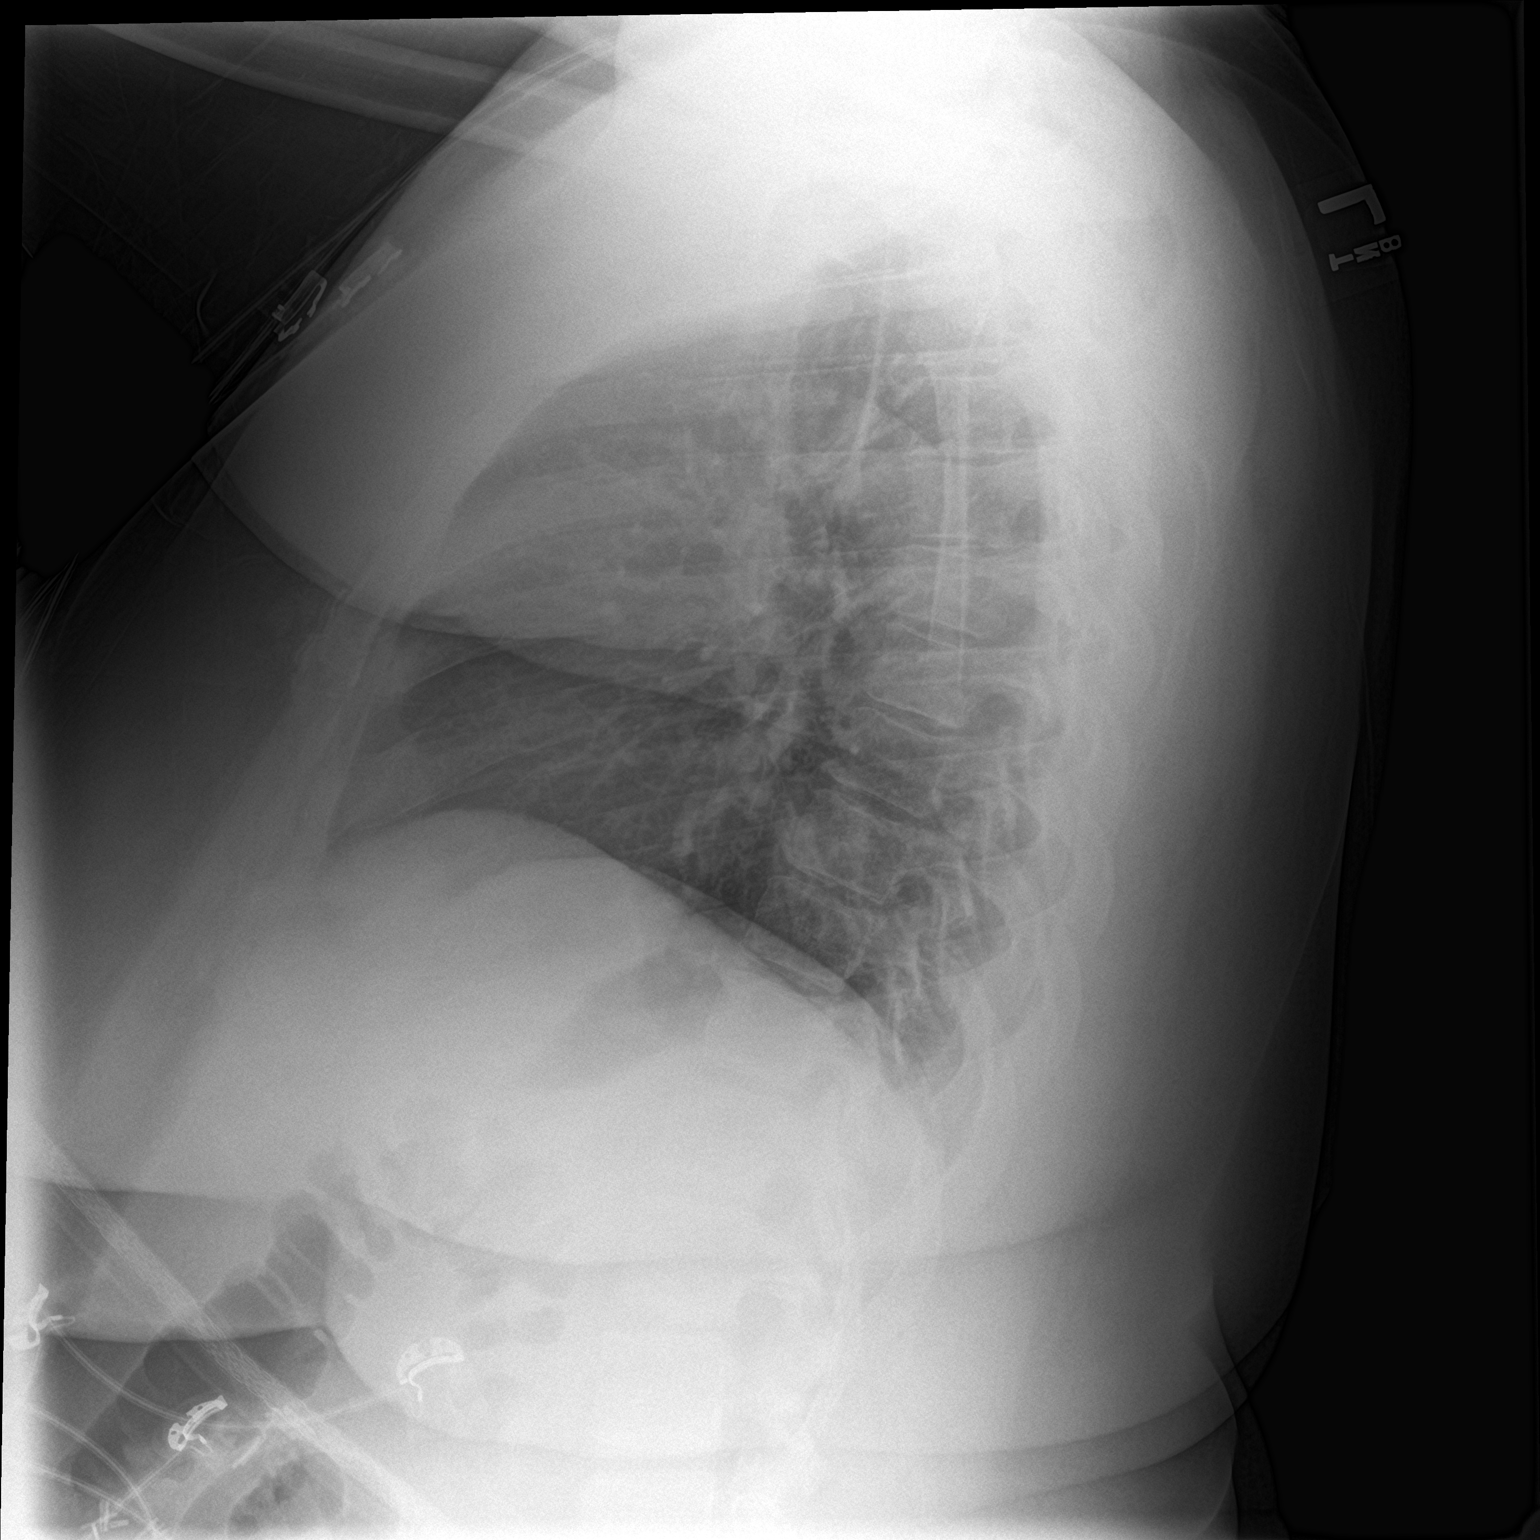

[2 of 2 positions shown; findings below may reference images not displayed]

FINDINGS: The heart size and mediastinal contours are within normal limits.
Both lungs are clear. The visualized skeletal structures are
unremarkable.
IMPRESSION: Negative.

## 2020-03-20 ENCOUNTER — Encounter (HOSPITAL_COMMUNITY): Payer: Self-pay | Admitting: *Deleted

## 2020-03-20 ENCOUNTER — Other Ambulatory Visit: Payer: Self-pay

## 2020-03-20 ENCOUNTER — Emergency Department (HOSPITAL_COMMUNITY): Payer: Medicaid Other

## 2020-03-20 ENCOUNTER — Emergency Department (HOSPITAL_COMMUNITY)
Admission: EM | Admit: 2020-03-20 | Discharge: 2020-03-20 | Disposition: A | Discharge status: Home / Self Care | Payer: Medicaid Other | Attending: Emergency Medicine | Admitting: Emergency Medicine

## 2020-03-20 DIAGNOSIS — I10 Essential (primary) hypertension: Secondary | ICD-10-CM | POA: Diagnosis not present

## 2020-03-20 DIAGNOSIS — K5900 Constipation, unspecified: Secondary | ICD-10-CM | POA: Diagnosis present

## 2020-03-20 DIAGNOSIS — Z79899 Other long term (current) drug therapy: Secondary | ICD-10-CM | POA: Diagnosis not present

## 2020-03-20 LAB — BASIC METABOLIC PANEL
Anion gap: 6 (ref 5–15)
BUN: 17 mg/dL (ref 6–20)
CO2: 27 mmol/L (ref 22–32)
Calcium: 8.6 mg/dL — ABNORMAL LOW (ref 8.9–10.3)
Chloride: 103 mmol/L (ref 98–111)
Creatinine, Ser: 0.78 mg/dL (ref 0.44–1.00)
GFR, Estimated: >60 mL/min (ref >60)
Glucose, Bld: 103 mg/dL — ABNORMAL HIGH (ref 70–99)
Potassium: 3 mmol/L — ABNORMAL LOW (ref 3.5–5.1)
Sodium: 136 mmol/L (ref 135–145)

## 2020-03-20 LAB — CBC
HCT: 32.6 % — ABNORMAL LOW (ref 36.0–46.0)
Hemoglobin: 9.4 g/dL — ABNORMAL LOW (ref 12.0–15.0)
MCH: 23.5 pg — ABNORMAL LOW (ref 26.0–34.0)
MCHC: 28.8 g/dL — ABNORMAL LOW (ref 30.0–36.0)
MCV: 81.5 fL (ref 80.0–100.0)
Platelets: 494 10*3/uL — ABNORMAL HIGH (ref 150–400)
RBC: 4 MIL/uL (ref 3.87–5.11)
RDW: 16.6 % — ABNORMAL HIGH (ref 11.5–15.5)
WBC: 9.7 10*3/uL (ref 4.0–10.5)
nRBC: 0 % (ref 0.0–0.2)

## 2020-03-20 MED ORDER — IOHEXOL 300 MG/ML  SOLN
100.0000 mL | Freq: Once | INTRAMUSCULAR | Status: AC | PRN
  Administered 2020-03-20: 100 mL via INTRAVENOUS

## 2020-03-20 MED ORDER — SULFAMETHOXAZOLE-TRIMETHOPRIM 800-160 MG PO TABS: 1.0000 | ORAL_TABLET | Freq: Two times a day (BID) | ORAL | 0 refills | Status: AC

## 2020-03-20 NOTE — ED Triage Notes (Signed)
Pt c/o constipation for several days.  LBM today per pt. And normal.  Pt states she has been straining and noted blood on toilet paper.

## 2020-03-20 NOTE — Discharge Instructions (Signed)
Your CT scan shows that you have some inflammation in your rectum which could be an early infection, it also shows that you have constipation Please take a scoop of MiraLAX twice a day as directed on the bottle to help have a bowel movement Please stop straining, the blood is likely coming from the straining Please take Bactrim twice a day for the next 7 days to treat for this possible early infection  Return to the ER for severe worsening symptoms

## 2020-03-20 NOTE — ED Provider Notes (Signed)
[facility] EMERGENCY DEPARTMENT Provider Note   CSN: [id number] Arrival date & time: 03/20/20  1607     History Chief Complaint  Patient presents with  . Constipation    Denise Walters is a 39 y.o. female.  HPI   This patient is a 39 year old female, she has a history of high blood pressure for which she takes medications.  She has had a prior cholecystectomy, prior tubal ligation, she has never struggled with constipation and usually has 1 bowel movement per day.  She noted 2 days ago that she was not having a bowel movement, she has been straining because she feels like there is stool in the rectum, she pushes and strains very hard and has small bowel movements that come out but nothing that is giving her relief.  She has noticed a small amount of bright red blood on the paper and sometimes in the toilet but the stool itself is brown and she thinks it is soft and not hard.  She has never had this problem, she took 1 dose of milk of magnesia this morning and another dose right before she came in.  No other symptoms including fevers chills nausea vomiting coughing shortness of breath chest pain abdominal pain swelling rashes numbness weakness or headaches.  Past Medical History:  Diagnosis Date  . Abscess   . Chronic headache   . Elevated lipids   . History of seizure 2010  . Hypertension   . Seizures (HCC)   . Tonsillar and adenoid hypertrophy     Patient Active Problem List   Diagnosis Date Noted  . Chest pain 01/22/2018  . Fatty liver 01/22/2018  . Rectal bleeding 06/15/2016  . FH: colon cancer 06/15/2016  . Constipation 06/15/2016    Past Surgical History:  Procedure Laterality Date  . BREAST REDUCTION SURGERY    . CESAREAN SECTION  10/05/2003  . CHOLECYSTECTOMY  12/01/2003  . COLONOSCOPY N/A 06/28/2016   Dr. Darrick Penna: diverticulosis, hemorrhoids. next tcs in 06/2021 due to [facility].  . TONSILLECTOMY    . TONSILLECTOMY AND ADENOIDECTOMY Bilateral 05/05/2014   Procedure:  BILATERAL TONSILLECTOMY  W/  ABLATATION OF ADENOIDS;  Surgeon: Darletta Moll, MD;  Location: [facility];  Service: ENT;  Laterality: Bilateral;  . TUBAL LIGATION  01/08/2004     OB History    Gravida      Para      Term      Preterm      AB      Living  1     SAB      TAB      Ectopic      Multiple      Live Births              Family History  Problem Relation Age of Onset  . Colon cancer Mother   . Heart attack Father   . Hypertension Brother     Social History   Tobacco Use  . Smoking status: Never Smoker  . Smokeless tobacco: Never Used  Vaping Use  . Vaping Use: Never used  Substance Use Topics  . Alcohol use: No  . Drug use: No    Home Medications Prior to Admission medications   Medication Sig Start Date End Date Taking? Authorizing Provider  amLODipine (NORVASC) 10 MG tablet Take 10 mg by mouth daily.    [provider]  carvedilol (COREG) 25 MG tablet Take 25 mg by mouth 2 (two)  times daily. 07/25/17   [provider]  doxycycline (VIBRAMYCIN) 100 MG capsule Take 1 capsule (100 mg total) by mouth 2 (two) times daily. 12/12/18   Liberty Handy, PA-C  lisinopril (PRINIVIL,ZESTRIL) 20 MG tablet Take 1 tablet by mouth daily. 07/25/17   [provider]  lisinopril-hydrochlorothiazide (PRINZIDE,ZESTORETIC) 20-25 MG tablet Take 1 tablet by mouth daily. Add to the lisinopril 20 mg    [provider]  pantoprazole (PROTONIX) 40 MG tablet Take 1 tablet (40 mg total) by mouth daily before breakfast. 02/11/20   Elson Areas, PA-C  sulfamethoxazole-trimethoprim (BACTRIM DS) 800-160 MG tablet Take 1 tablet by mouth 2 (two) times daily for 7 days. 03/20/20 03/27/20  Eber Hong, MD    Allergies    Chocolate  Review of Systems   Review of Systems  Constitutional: Negative for chills and fever.  HENT: Negative for sore throat.   Eyes: Negative for visual disturbance.  Respiratory: Negative for cough and  shortness of breath.   Cardiovascular: Negative for chest pain.  Gastrointestinal: Positive for blood in stool and constipation. Negative for abdominal pain, diarrhea, nausea and vomiting.  Genitourinary: Negative for dysuria and frequency.  Musculoskeletal: Negative for back pain and neck pain.  Skin: Negative for rash.  Neurological: Negative for weakness, numbness and headaches.  Hematological: Negative for adenopathy.  Psychiatric/Behavioral: Negative for behavioral problems.    Physical Exam Updated Vital Signs BP 125/79   Pulse 97   Temp 98.4 F (36.9 C) (Oral)   Resp 18   Ht 1.575 m (5\' 2" )   Wt 128.4 kg   LMP 03/09/2020   SpO2 100%   BMI 51.76 kg/m   Physical Exam Vitals and nursing note reviewed.  Constitutional:      General: She is not in acute distress.    Appearance: She is well-developed.  HENT:     Head: Normocephalic and atraumatic.     Mouth/Throat:     Pharynx: No oropharyngeal exudate.  Eyes:     General: No scleral icterus.       Right eye: No discharge.        Left eye: No discharge.     Conjunctiva/sclera: Conjunctivae normal.     Pupils: Pupils are equal, round, and reactive to light.  Neck:     Thyroid: No thyromegaly.     Vascular: No JVD.  Cardiovascular:     Rate and Rhythm: Normal rate and regular rhythm.     Heart sounds: Normal heart sounds. No murmur heard.  No friction rub. No gallop.   Pulmonary:     Effort: Pulmonary effort is normal. No respiratory distress.     Breath sounds: Normal breath sounds. No wheezing or rales.  Abdominal:     General: Bowel sounds are normal. There is no distension.     Palpations: Abdomen is soft. There is no mass.     Tenderness: There is no abdominal tenderness.  Genitourinary:    Comments: Chaperone present: I explained the exam to the patient, she agreed to both an external and internal exam of her rectum. Externally there was no signs of hemorrhoids or fissures, normal-appearing anus, internally  there was tenderness at 6:00 and 9:00 with an area that felt swollen, no blood, stool was brown. No other masses or fissures Musculoskeletal:        General: No tenderness. Normal range of motion.     Cervical back: Normal range of motion and neck supple.  Lymphadenopathy:  Cervical: No cervical adenopathy.  Skin:    General: Skin is warm and dry.     Findings: No erythema or rash.  Neurological:     Mental Status: She is alert.     Coordination: Coordination normal.  Psychiatric:        Behavior: Behavior normal.     ED Results / Procedures / Treatments   Labs (all labs ordered are listed, but only abnormal results are displayed) Labs Reviewed  CBC - Abnormal; Notable for the following components:      Result Value   Hemoglobin 9.4 (*)    HCT 32.6 (*)    MCH 23.5 (*)    MCHC 28.8 (*)    RDW 16.6 (*)    Platelets 494 (*)    All other components within normal limits  BASIC METABOLIC PANEL - Abnormal; Notable for the following components:   Potassium 3.0 (*)    Glucose, Bld 103 (*)    Calcium 8.6 (*)    All other components within normal limits    EKG None  Radiology CT PELVIS W CONTRAST  Result Date: 03/20/2020 CLINICAL DATA:  Rectal pain. EXAM: CT PELVIS WITH CONTRAST TECHNIQUE: Multidetector CT imaging of the pelvis was performed using the standard protocol following the bolus administration of intravenous contrast. CONTRAST:  OMNIPAQUE IOHEXOL 300 MG/ML  SOLN COMPARISON:  None. FINDINGS: Urinary Tract:  No abnormality visualized. Bowel:  Unremarkable visualized pelvic bowel loops. Vascular/Lymphatic: No pathologically enlarged lymph nodes. No significant vascular abnormality seen. Reproductive:  No mass or other significant abnormality Other: A very mild amount of asymmetric perirectal inflammatory fat stranding is seen on the left. There is no evidence of associated fluid collection or abscess. Musculoskeletal: No suspicious bone lesions identified. IMPRESSION:  Very mild asymmetric perirectal inflammation on the left, without evidence of an associated fluid collection or abscess. Electronically Signed   By: Aram Candela M.D.   On: 03/20/2020 20:20    Procedures Procedures (including critical care time)  Medications Ordered in ED Medications  iohexol (OMNIPAQUE) 300 MG/ML solution 100 mL (100 mLs Intravenous Contrast Given 03/20/20 1955)    ED Course  I have reviewed the triage vital signs and the nursing notes.  Pertinent labs & imaging results that were available during my care of the patient were reviewed by me and considered in my medical decision making (see chart for details).  Clinical Course as of Mar 21 2047  Sat Mar 20, 2020  2045 CT report seen and appreciated, I have viewed the CT scan myself and agree that there is mild inflammation, there is no leukocytosis, vitals are unremarkable, at this point the patient will be given an antibiotic to cover for bacterial cause of possible early phlegmonous changes however she is stable for discharge, will need a laxative.   [BM]    Clinical Course User Index [BM] Eber Hong, MD   MDM Rules/Calculators/A&P                          CT scan of the pelvis ordered to further rule out perirectal abscess though I suspect this may just be constipation there was an abnormal rectal exam, the patient is agreeable to the plan, she agreed to the full rectal exam after risk benefits and alternatives and explanation of the procedure, female RN was at the bedside for this procedure.  Final Clinical Impression(s) / ED Diagnoses Final diagnoses:  Constipation, unspecified constipation type  Rx / DC Orders ED Discharge Orders         Ordered    sulfamethoxazole-trimethoprim (BACTRIM DS) 800-160 MG tablet  2 times daily        03/20/20 2047           Eber HongMiller, Whitley Strycharz, MD 03/20/20 2048

## 2020-03-20 NOTE — ED Triage Notes (Signed)
Has taken MOM for constipation.

## 2020-04-08 ENCOUNTER — Emergency Department (HOSPITAL_COMMUNITY): Payer: Medicaid Other

## 2020-04-08 ENCOUNTER — Emergency Department (HOSPITAL_COMMUNITY)
Admission: EM | Admit: 2020-04-08 | Discharge: 2020-04-08 | Disposition: A | Discharge status: Home / Self Care | Payer: Medicaid Other | Attending: Emergency Medicine | Admitting: Emergency Medicine

## 2020-04-08 ENCOUNTER — Other Ambulatory Visit: Payer: Self-pay

## 2020-04-08 ENCOUNTER — Encounter (HOSPITAL_COMMUNITY): Payer: Self-pay | Admitting: Emergency Medicine

## 2020-04-08 DIAGNOSIS — G510 Bell's palsy: Secondary | ICD-10-CM | POA: Diagnosis not present

## 2020-04-08 DIAGNOSIS — Z79899 Other long term (current) drug therapy: Secondary | ICD-10-CM | POA: Diagnosis not present

## 2020-04-08 DIAGNOSIS — R202 Paresthesia of skin: Secondary | ICD-10-CM | POA: Diagnosis present

## 2020-04-08 DIAGNOSIS — I10 Essential (primary) hypertension: Secondary | ICD-10-CM | POA: Insufficient documentation

## 2020-04-08 MED ORDER — VALACYCLOVIR HCL 1 G PO TABS: 1000.0000 mg | ORAL_TABLET | Freq: Three times a day (TID) | ORAL | 0 refills | Status: AC

## 2020-04-08 MED ORDER — PREDNISONE 50 MG PO TABS
60.0000 mg | ORAL_TABLET | Freq: Once | ORAL | Status: AC
  Administered 2020-04-08: 60 mg via ORAL
  Filled 2020-04-08: qty 1

## 2020-04-08 MED ORDER — PREDNISONE 10 MG PO TABS: 60.0000 mg | ORAL_TABLET | Freq: Every day | ORAL | 0 refills | Status: AC

## 2020-04-08 NOTE — ED Provider Notes (Signed)
[facility] EMERGENCY DEPARTMENT Provider Note   CSN: [id number] Arrival date & time: 04/08/20  1018     History Chief Complaint  Patient presents with   Numbness    Right sided facial droop.    Criss Rosalesamika L Fetting is a 39 y.o. female.  HPI Patient is a 39 year old female with a past medical history of HTN, seizures  Patient is well-appearing 39 year old female presented today with 2 days of facial tingling and numbness on the left side followed by facial droop that she noticed this morning.  She states that she also feels that she cannot taste very well.  She states that she is otherwise felt well.  Has no other new or concerning symptoms.  She was concerned that she was having a stroke and therefore came to the emergency department for evaluation.  She states that she seems to have woken up with the symptoms as she noticed at 8 AM this morning when she was eating.  She denies any recent URI symptoms states that she cannot be pregnant as she has had her "tubes tied " She denies any other weakness or numbness in her body no slurred speech or difficulty swallowing.  No other associate symptoms.  No aggravating or mitigating factors.     Past Medical History:  Diagnosis Date   Abscess    Chronic headache    Elevated lipids    History of seizure 2010   Hypertension    Seizures (HCC)    Tonsillar and adenoid hypertrophy     Patient Active Problem List   Diagnosis Date Noted   Chest pain 01/22/2018   Fatty liver 01/22/2018   Rectal bleeding 06/15/2016   FH: colon cancer 06/15/2016   Constipation 06/15/2016    Past Surgical History:  Procedure Laterality Date   BREAST REDUCTION SURGERY     CESAREAN SECTION  10/05/2003   CHOLECYSTECTOMY  12/01/2003   COLONOSCOPY N/A 06/28/2016   Dr. Darrick PennaFields: diverticulosis, hemorrhoids. [facility]   TONSILLECTOMY     TONSILLECTOMY AND ADENOIDECTOMY Bilateral 05/05/2014   Procedure: BILATERAL  TONSILLECTOMY  W/  ABLATATION OF ADENOIDS;  Surgeon: Darletta MollSui W Teoh, MD;  Location: [facility];  Service: ENT;  Laterality: Bilateral;   TUBAL LIGATION  01/08/2004     OB History    Gravida      Para      Term      Preterm      AB      Living  1     SAB      IAB      Ectopic      Multiple      Live Births              Family History  Problem Relation Age of Onset   Colon cancer Mother    Heart attack Father    Hypertension Brother     Social History   Tobacco Use   Smoking status: Never Smoker   Smokeless tobacco: Never Used  Vaping Use   Vaping Use: Never used  Substance Use Topics   Alcohol use: No   Drug use: No    Home Medications Prior to Admission medications   Medication Sig Start Date End Date Taking? Authorizing Provider  amLODipine (NORVASC) 10 MG tablet Take 10 mg by mouth daily.    [provider]  carvedilol (COREG) 25 MG tablet Take 25 mg by mouth 2 (two) times daily.  07/25/17   [provider]  doxycycline (VIBRAMYCIN) 100 MG capsule Take 1 capsule (100 mg total) by mouth 2 (two) times daily. 12/12/18   Liberty Handy, PA-C  lisinopril (PRINIVIL,ZESTRIL) 20 MG tablet Take 1 tablet by mouth daily. 07/25/17   [provider]  lisinopril-hydrochlorothiazide (PRINZIDE,ZESTORETIC) 20-25 MG tablet Take 1 tablet by mouth daily. Add to the lisinopril 20 mg    [provider]  pantoprazole (PROTONIX) 40 MG tablet Take 1 tablet (40 mg total) by mouth daily before breakfast. 02/11/20   Elson Areas, PA-C    Allergies    Chocolate  Review of Systems   Review of Systems  Constitutional: Negative for chills and fever.  HENT: Negative for congestion.   Eyes: Negative for pain.  Respiratory: Negative for cough and shortness of breath.   Cardiovascular: Negative for chest pain and leg swelling.  Gastrointestinal: Negative for abdominal pain and vomiting.  Genitourinary: Negative for  dysuria.  Musculoskeletal: Negative for myalgias.  Skin: Negative for rash.  Neurological: Positive for numbness. Negative for dizziness and headaches.       Facial tingling/paresthesias  Facial droop  Psychiatric/Behavioral: Negative for agitation and hallucinations.    Physical Exam Updated Vital Signs BP 126/86    Pulse 95    Temp 98.4 F (36.9 C) (Oral)    Resp 12    Ht 5\' 2"  (1.575 m)    Wt 130.6 kg    LMP 03/28/2020 (Approximate)    SpO2 91%    BMI 52.68 kg/m   Physical Exam Vitals and nursing note reviewed.  Constitutional:      General: She is not in acute distress. HENT:     Head: Normocephalic and atraumatic.     Nose: Nose normal.  Eyes:     General: No scleral icterus. Cardiovascular:     Rate and Rhythm: Normal rate and regular rhythm.     Pulses: Normal pulses.     Heart sounds: Normal heart sounds.  Pulmonary:     Effort: Pulmonary effort is normal. No respiratory distress.     Breath sounds: Normal breath sounds.  Abdominal:     Palpations: Abdomen is soft.     Tenderness: There is no abdominal tenderness.  Musculoskeletal:     Cervical back: Normal range of motion.     Right lower leg: No edema.     Left lower leg: No edema.  Skin:    General: Skin is warm and dry.     Capillary Refill: Capillary refill takes less than 2 seconds.  Neurological:     Mental Status: She is alert. Mental status is at baseline.     Comments: Alert and oriented to self, place, time and event.   Speech is fluent, clear without dysarthria or dysphasia.   Strength 5/5 in upper/lower extremities  Sensation intact in upper/lower extremities   Normal gait.  Normal finger-to-nose and feet tapping.  CN I not tested  CN II grossly intact visual fields bilaterally. Did not visualize posterior eye.   CN III, IV, VI PERRLA and EOMs intact bilaterally  CN V Intact sensation to sharp and light touch to the face  CN VII facial movements asymmetric with complete left facial  paralysis. Incomplete eyelid closure on left. CN VIII not tested  CN IX, X no uvula deviation, symmetric rise of soft palate  CN XI 5/5 SCM and trapezius strength bilaterally  CN XII Midline tongue protrusion, symmetric L/R movements   Psychiatric:  Mood and Affect: Mood normal.        Behavior: Behavior normal.     ED Results / Procedures / Treatments   Labs (all labs ordered are listed, but only abnormal results are displayed) Labs Reviewed - No data to display  EKG EKG Interpretation  Date/Time:  Thursday April 08 2020 10:54:04 EST Ventricular Rate:  97 PR Interval:    QRS Duration: 90 QT Interval:  340 QTC Calculation: 432 R Axis:   55 Text Interpretation: Sinus rhythm Low voltage, precordial leads Borderline T abnormalities, diffuse leads Baseline wander in lead(s) II III aVL aVF Confirmed by Vanetta Mulders [phone]) on 04/08/2020 11:15:57 AM   Radiology CT Head Wo Contrast  Result Date: 04/08/2020 CLINICAL DATA:  Numbness or tingling, paresthesias. EXAM: CT HEAD WITHOUT CONTRAST TECHNIQUE: Contiguous axial images were obtained from the base of the skull through the vertex without intravenous contrast. COMPARISON:  MRI Sep 17, 2006. FINDINGS: Brain: No evidence of acute large vascular territory infarction, hemorrhage, hydrocephalus, extra-axial collection or mass lesion/mass effect. Small rounded CSF density in the right sylvian fissure may represent a small arachnoid cyst. Mild prominence of the extra-axial spaces. Partially empty and expanded sella. Vascular: No hyperdense vessel identified. Skull: No acute fracture. Sinuses/Orbits: Visualized sinuses are clear. Unremarkable visualized orbits. Other: No mastoid effusions. IMPRESSION: 1. No evidence of acute intracranial abnormality. 2. Partially empty and expanded sella. While this finding is frequently incidental, it can be seen with idiopathic intracranial hypertension in the correct clinical setting. Electronically  Signed   By: Feliberto Harts MD   On: 04/08/2020 10:58    Procedures Procedures (including critical care time)  Medications Ordered in ED Medications - No data to display  ED Course  I have reviewed the triage vital signs and the nursing notes.  Pertinent labs & imaging results that were available during my care of the patient were reviewed by me and considered in my medical decision making (see chart for details).    MDM Rules/Calculators/A&P                          Patient is well-appearing 39 year old female presented today with 2 days of facial tingling and numbness on the left side followed by facial droop that she noticed this morning.  She states that she also feels that she cannot taste very well.  She denies any recent URI symptoms states that she cannot be pregnant as she has had her "tubes tied " She denies any other weakness or numbness in her body no slurred speech or difficulty swallowing.  Physical exam is notable for no cranial nerve, motor or sensory abnormalities apart from left-sided facial weakness noted of the upper and lower side of the face.  Her eyelid does not completely close on the left.  I have low suspicion for MS or stroke.  High suspicion for Bell's palsy this is a classic case.  I discussed this case with my attending physician who cosigned this note including patient's presenting symptoms, physical exam, and planned diagnostics and interventions. Attending physician stated agreement with plan or made changes to plan which were implemented.   Attending physician assessed patient at bedside when she was in triage earlier.  We will discharge patient with appropriate eye care including daily eyedrops throughout the day and ointment at night as well as keeping eye closed.  She will follow up with her primary care doctor and I did discuss her following up with an  ophthalmologist as well if needed.  She will also follow-up with neurology Dr.  Gerilyn Pilgrim.  Discharge with Valtrex and steroids.  She was counseled on use of these.  She was able to teach back my care regimen.  She will also discuss with Dr. Gerilyn Pilgrim her CT scan which showed empty sella.  She does have a history of chronic headaches and is obese.  She is not complaining headache today.  This incidental finding can be followed up in the outpatient setting.  Final Clinical Impression(s) / ED Diagnoses Final diagnoses:  Bell's palsy    Rx / DC Orders ED Discharge Orders    None       Gailen Shelter, [place] 04/08/20 1222    Vanetta Mulders, MD 04/09/20 [phone]

## 2020-04-08 NOTE — ED Triage Notes (Signed)
Patient started having some numbness in her mouth 2 days ago, went to bed okay at 1130 pm Sunday night, currently presents with right side facial droop and loss of taste.

## 2020-04-08 NOTE — Discharge Instructions (Signed)
You have a condition called Bell's palsy.  Please read the attached information.  Please take prednisone as prescribed.  This may increase your blood sugars, make you feel agitated, cause insomnia.  However it is shown to improve the likelihood of complete reversal of your facial paralysis.  I have given you a neurologist to follow-up with.  Please also follow-up with your primary care doctor.  I have also prescribed you valacyclovir this is due to viral illnesses frequently causing the symptoms.  Please take this as well.  I also recommend (In fact this is likely the most important aspect of care) using artificial tears to keep your left eye moisturized.  You may use these throughout the day.  I recommend using a preservative-free eyedrop.  At nighttime please use a eye ointment such as systane nighttime lubricant eye ointment (or other similar product).   It is also important to tape your eyelid closed at night as I injuries can occur if your eyedrops open.

## 2020-10-10 ENCOUNTER — Other Ambulatory Visit: Payer: Self-pay

## 2020-10-10 ENCOUNTER — Encounter (HOSPITAL_COMMUNITY): Payer: Self-pay | Admitting: *Deleted

## 2020-10-10 ENCOUNTER — Emergency Department (HOSPITAL_COMMUNITY)
Admission: EM | Admit: 2020-10-10 | Discharge: 2020-10-10 | Disposition: A | Discharge status: Home / Self Care | Payer: Medicaid Other | Attending: Emergency Medicine | Admitting: Emergency Medicine

## 2020-10-10 DIAGNOSIS — H9201 Otalgia, right ear: Secondary | ICD-10-CM | POA: Diagnosis not present

## 2020-10-10 DIAGNOSIS — Z79899 Other long term (current) drug therapy: Secondary | ICD-10-CM | POA: Diagnosis not present

## 2020-10-10 DIAGNOSIS — T7840XA Allergy, unspecified, initial encounter: Secondary | ICD-10-CM | POA: Diagnosis not present

## 2020-10-10 DIAGNOSIS — I10 Essential (primary) hypertension: Secondary | ICD-10-CM | POA: Diagnosis not present

## 2020-10-10 DIAGNOSIS — I2510 Atherosclerotic heart disease of native coronary artery without angina pectoris: Secondary | ICD-10-CM | POA: Diagnosis not present

## 2020-10-10 DIAGNOSIS — H9209 Otalgia, unspecified ear: Secondary | ICD-10-CM

## 2020-10-10 MED ORDER — LORATADINE 10 MG PO TABS: 10.0000 mg | ORAL_TABLET | Freq: Every day | ORAL | 0 refills | Status: DC

## 2020-10-10 MED ORDER — NEOMYCIN-POLYMYXIN-HC 3.5-10000-1 OT SUSP: 4.0000 [drp] | Freq: Three times a day (TID) | OTIC | 0 refills | Status: AC

## 2020-10-10 NOTE — ED Triage Notes (Addendum)
Pt c/o right ear pain x 4 days. Nasal congestion since yesterday but reports she mowed the grass yesterday and that is normal for her. Denies fever.

## 2020-10-10 NOTE — ED Provider Notes (Signed)
[facility] EMERGENCY DEPARTMENT Provider Note   CSN: [id number] Arrival date & time: 10/10/20  7341     History Chief Complaint  Patient presents with   Otalgia    Denise Walters is a 40 y.o. female.  HPI  41 year old female history of abscess, chronic headache, elevated lipids, CAD, hypertension, who presents to the emergency department today for evaluation of discomfort to the right ear.  She states that few days ago she had some itching in her ear so she scratched it with her fingernail.  Following this she heard a buzzing sound and since then she has been worried that she has an insect in her right ear.  She reports yesterday she developed some congestion because she mowed the lawn.  She denies any other associated upper respiratory symptoms.  She is on Flonase without resolution of symptoms today.  She does have allergies.  Past Medical History:  Diagnosis Date   Abscess    Chronic headache    Elevated lipids    History of seizure 2010   Hypertension    Seizures (HCC)    Tonsillar and adenoid hypertrophy     Patient Active Problem List   Diagnosis Date Noted   Chest pain 01/22/2018   Fatty liver 01/22/2018   Rectal bleeding 06/15/2016   FH: colon cancer 06/15/2016   Constipation 06/15/2016    Past Surgical History:  Procedure Laterality Date   BREAST REDUCTION SURGERY     CESAREAN SECTION  10/05/2003   CHOLECYSTECTOMY  12/01/2003   COLONOSCOPY N/A 06/28/2016   Dr. Darrick Penna: diverticulosis, hemorrhoids. next tcs in 06/2021 due to [facility].   TONSILLECTOMY     TONSILLECTOMY AND ADENOIDECTOMY Bilateral 05/05/2014   Procedure: BILATERAL TONSILLECTOMY  W/  ABLATATION OF ADENOIDS;  Surgeon: Darletta Moll, MD;  Location: [facility];  Service: ENT;  Laterality: Bilateral;   TUBAL LIGATION  01/08/2004     OB History     Gravida      Para      Term      Preterm      AB      Living  1      SAB      IAB      Ectopic      Multiple      Live  Births              Family History  Problem Relation Age of Onset   Colon cancer Mother    Heart attack Father    Hypertension Brother     Social History   Tobacco Use   Smoking status: Never   Smokeless tobacco: Never  Vaping Use   Vaping Use: Never used  Substance Use Topics   Alcohol use: No   Drug use: No    Home Medications Prior to Admission medications   Medication Sig Start Date End Date Taking? Authorizing Provider  loratadine (CLARITIN) 10 MG tablet Take 1 tablet (10 mg total) by mouth daily. 10/10/20 11/09/20 Yes Dannie Woolen S, PA-C  neomycin-polymyxin-hydrocortisone (CORTISPORIN) 3.5-10000-1 OTIC suspension Place 4 drops into both ears 3 (three) times daily for 10 days. 10/10/20 10/20/20 Yes Tianne Plott S, PA-C  amLODipine (NORVASC) 10 MG tablet Take 10 mg by mouth daily.    [provider]  carvedilol (COREG) 25 MG tablet Take 25 mg by mouth 2 (two) times daily. 07/25/17   [provider]  doxycycline (VIBRAMYCIN) 100 MG capsule Take 1 capsule (100 mg  total) by mouth 2 (two) times daily. 12/12/18   Liberty Handy, PA-C  lisinopril (PRINIVIL,ZESTRIL) 20 MG tablet Take 1 tablet by mouth daily. 07/25/17   [provider]  lisinopril-hydrochlorothiazide (PRINZIDE,ZESTORETIC) 20-25 MG tablet Take 1 tablet by mouth daily. Add to the lisinopril 20 mg    [provider]  pantoprazole (PROTONIX) 40 MG tablet Take 1 tablet (40 mg total) by mouth daily before breakfast. 02/11/20   Elson Areas, PA-C    Allergies    Chocolate  Review of Systems   Review of Systems  Constitutional:  Negative for fever.  HENT:  Positive for congestion.        Ear discomfort  Eyes:  Negative for visual disturbance.  Cardiovascular:  Negative for chest pain.  Neurological:  Negative for headaches.   Physical Exam Updated Vital Signs Ht 5\' 2"  (1.575 m)   Wt 129.7 kg   LMP 09/23/2020   BMI 52.31 kg/m   Physical Exam Vitals and nursing  note reviewed.  Constitutional:      General: She is not in acute distress.    Appearance: She is well-developed.  HENT:     Head: Normocephalic and atraumatic.     Ears:     Comments: Bilat Tms are nonerythamatous. The canals bilaterally or dry and there is some skin breakdown. No FB noted to the bilat ears    Nose: Congestion present.     Comments: Nasal turbinates are enlarged bilat Eyes:     Conjunctiva/sclera: Conjunctivae normal.  Cardiovascular:     Rate and Rhythm: Normal rate.  Pulmonary:     Effort: Pulmonary effort is normal.  Musculoskeletal:        General: Normal range of motion.     Cervical back: Neck supple.  Skin:    General: Skin is warm and dry.  Neurological:     Mental Status: She is alert.    ED Results / Procedures / Treatments   Labs (all labs ordered are listed, but only abnormal results are displayed) Labs Reviewed - No data to display  EKG None  Radiology No results found.  Procedures Procedures   Medications Ordered in ED Medications - No data to display  ED Course  I have reviewed the triage vital signs and the nursing notes.  Pertinent labs & imaging results that were available during my care of the patient were reviewed by me and considered in my medical decision making (see chart for details).    MDM Rules/Calculators/A&P                          40 year old female here with concern of foreign body in the right ear.  This is not observed on exam.  Her TMs are normal bilaterally but she does have some skin breakdown in the bilateral canal so we will give her some antibiotic/steroid drops to prevent any sort of infection from developing and to help with the itching in her ears.  Additionally I will start her on Claritin for her allergies since she is having nasal congestion.  I doubt any viral syndrome at this time.  Advised on follow-up with PCP and strict return precautions.  She voiced understanding plan reasons to return.   Questions answered.  Patient stable for discharge.  Final Clinical Impression(s) / ED Diagnoses Final diagnoses:  Allergy, initial encounter  Discomfort of ear, unspecified laterality    Rx / DC Orders ED Discharge Orders  Ordered    neomycin-polymyxin-hydrocortisone (CORTISPORIN) 3.5-10000-1 OTIC suspension  3 times daily        10/10/20 1007    loratadine (CLARITIN) 10 MG tablet  Daily        10/10/20 [address] [place], PA-C 10/10/20 1008    Bethann Berkshire, MD 10/11/20 1006

## 2020-10-10 NOTE — Discharge Instructions (Addendum)
Use the ear drops and claritin as prescribed  Please follow up with your primary care provider within 5-7 days for re-evaluation of your symptoms. If you do not have a primary care provider, information for a healthcare clinic has been provided for you to make arrangements for follow up care. Please return to the emergency department for any new or worsening symptoms.

## 2021-04-26 ENCOUNTER — Other Ambulatory Visit: Payer: Self-pay

## 2021-04-26 ENCOUNTER — Emergency Department (HOSPITAL_COMMUNITY)
Admission: EM | Admit: 2021-04-26 | Discharge: 2021-04-26 | Disposition: A | Discharge status: Home / Self Care | Payer: Medicaid Other | Attending: Emergency Medicine | Admitting: Emergency Medicine

## 2021-04-26 ENCOUNTER — Encounter (HOSPITAL_COMMUNITY): Payer: Self-pay

## 2021-04-26 DIAGNOSIS — I10 Essential (primary) hypertension: Secondary | ICD-10-CM | POA: Diagnosis not present

## 2021-04-26 DIAGNOSIS — S40862A Insect bite (nonvenomous) of left upper arm, initial encounter: Secondary | ICD-10-CM | POA: Diagnosis present

## 2021-04-26 DIAGNOSIS — Z79899 Other long term (current) drug therapy: Secondary | ICD-10-CM | POA: Diagnosis not present

## 2021-04-26 DIAGNOSIS — W57XXXA Bitten or stung by nonvenomous insect and other nonvenomous arthropods, initial encounter: Secondary | ICD-10-CM | POA: Insufficient documentation

## 2021-04-26 MED ORDER — PREDNISONE 20 MG PO TABS: 40.0000 mg | ORAL_TABLET | Freq: Every day | ORAL | 0 refills | Status: DC

## 2021-04-26 NOTE — ED Triage Notes (Signed)
Pt with complaints of insect bite on left upper arm that occurred Saturday with itching.

## 2021-04-26 NOTE — ED Provider Notes (Signed)
[facility] EMERGENCY DEPARTMENT Provider Note   CSN: [id number] Arrival date & time: 04/26/21  0076     History  Chief Complaint  Patient presents with   Insect Bite    Denise Walters is a 41 y.o. female.  HPI     Denise Walters is a 41 y.o. female with past medical history of seizures, and hypertension who presents to the Emergency Department complaining of itching, redness and rash to the left upper arm.  She states she was walking her dog on Saturday and noticed itching to her arm.  She did not feel anything sting or bite her, but believes that she encountered some type of insect bite.  She has been applying over-the-counter 1% hydrocortisone cream without relief.  She has also took Benadryl capsules with some improvement of the itching.  Symptoms have been associated with some localized swelling of her arm.  She denies any fever, chills, pain, rash or swelling to other parts of her body or face  Home Medications Prior to Admission medications   Medication Sig Start Date End Date Taking? Authorizing Provider  amLODipine (NORVASC) 10 MG tablet Take 10 mg by mouth daily.    [provider]  carvedilol (COREG) 25 MG tablet Take 25 mg by mouth 2 (two) times daily. 07/25/17   [provider]  doxycycline (VIBRAMYCIN) 100 MG capsule Take 1 capsule (100 mg total) by mouth 2 (two) times daily. 12/12/18   Liberty Handy, PA-C  lisinopril (PRINIVIL,ZESTRIL) 20 MG tablet Take 1 tablet by mouth daily. 07/25/17   [provider]  lisinopril-hydrochlorothiazide (PRINZIDE,ZESTORETIC) 20-25 MG tablet Take 1 tablet by mouth daily. Add to the lisinopril 20 mg    [provider]  loratadine (CLARITIN) 10 MG tablet Take 1 tablet (10 mg total) by mouth daily. 10/10/20 11/09/20  Couture, Cortni S, PA-C  pantoprazole (PROTONIX) 40 MG tablet Take 1 tablet (40 mg total) by mouth daily before breakfast. 02/11/20   Elson Areas, PA-C     Social history: No pertinent  social history  Surgical history: No relevant surgical history Allergies    Chocolate    Review of Systems   Review of Systems  Constitutional:  Negative for appetite change and fever.  HENT:  Negative for facial swelling, sore throat and trouble swallowing.   Respiratory:  Negative for cough, shortness of breath and wheezing.   Gastrointestinal:  Negative for abdominal pain, nausea and vomiting.  Musculoskeletal:  Negative for arthralgias, myalgias and neck pain.  Skin:  Positive for color change and rash.       Itching and rash to left upper arm  Neurological:  Negative for weakness, numbness and headaches.  All other systems reviewed and are negative.  Physical Exam Updated Vital Signs BP (!) 126/99 (BP Location: Right Arm)    Pulse (!) 118    Temp 98.8 F (37.1 C) (Oral)    Resp 20    Ht 5\' 2"  (1.575 m)    Wt 131.5 kg    LMP 04/23/2021 (Approximate)    SpO2 100%    BMI 53.04 kg/m  Physical Exam Vitals and nursing note reviewed.  Constitutional:      General: She is not in acute distress.    Appearance: Normal appearance. She is not ill-appearing or toxic-appearing.  Eyes:     Conjunctiva/sclera: Conjunctivae normal.     Pupils: Pupils are equal, round, and reactive to light.  Cardiovascular:     Rate and Rhythm: Normal  rate and regular rhythm.     Pulses: Normal pulses.  Pulmonary:     Effort: Pulmonary effort is normal. No respiratory distress.     Breath sounds: No wheezing.  Musculoskeletal:        General: No tenderness or signs of injury. Normal range of motion.     Cervical back: Normal range of motion.  Lymphadenopathy:     Cervical: No cervical adenopathy.  Skin:    General: Skin is warm.     Capillary Refill: Capillary refill takes less than 2 seconds.     Findings: Erythema and rash present.     Comments: Localized, maculopapular rash to the lateral left upper arm.  6 cm of erythema noted with a single, serous small blister.  No induration or fluctuance.   No drainage.  Neurological:     General: No focal deficit present.     Mental Status: She is alert.     Sensory: No sensory deficit.     Motor: No weakness.    ED Results / Procedures / Treatments   Labs (all labs ordered are listed, but only abnormal results are displayed) Labs Reviewed - No data to display  EKG None  Radiology No results found.  Procedures Procedures    Medications Ordered in ED Medications - No data to display  ED Course/ Medical Decision Making/ A&P                           Medical Decision Making  Patient here with 3-day history of itching, redness and rash to the left upper arm.  Noticed after walking her dog outside.  Believes she was bitten by some type of insect.  Denies other rash, pain, or drainage.  No fever chills difficulty swallowing or facial swelling.  On exam, patient well-appearing nontoxic.  Vitals reviewed by me.  She is noted to be hypertensive with history of same.  States that she takes her antihypertensive medications daily but PCP is currently adjusting her dosages.  She denies any associated symptoms.   Differential would include insect bite, nonspecific rash, abscess, cellulitis.    Doubt this is emergent process.  No indication for imaging or laboratory studies at this time.  Patient appropriate for discharge home  Patient with nonemergent skin rash.  Will treat with over-the-counter Benadryl and short course of oral steroids since her symptoms have not improved with topical steroid.  She appears appropriate for discharge at this time all questions were answered.  Return precautions were discussed.  I have also advised patient to follow-up closely with PCP regarding her hypertension.          Final Clinical Impression(s) / ED Diagnoses Final diagnoses:  Insect bite of left upper arm, initial encounter    Rx / DC Orders ED Discharge Orders     None         Pauline Aus, PA-C 04/26/21 0959    Terrilee Files, MD 04/26/21 [phone]

## 2021-04-26 NOTE — Discharge Instructions (Signed)
Continue taking over-the-counter Benadryl, 125 mg capsule every 4-6 hours as needed for itching.  Cool compresses may also help with itching.  Take the prescription prednisone as directed until finished.  Follow-up with your primary care provider for recheck.  Also, your blood pressure today is elevated.  Please follow-up with your primary care provider to have your blood pressure rechecked.

## 2021-05-21 ENCOUNTER — Other Ambulatory Visit: Payer: Self-pay

## 2021-05-21 ENCOUNTER — Encounter (HOSPITAL_COMMUNITY): Payer: Self-pay

## 2021-05-21 DIAGNOSIS — L509 Urticaria, unspecified: Secondary | ICD-10-CM | POA: Insufficient documentation

## 2021-05-21 DIAGNOSIS — Z79899 Other long term (current) drug therapy: Secondary | ICD-10-CM | POA: Diagnosis not present

## 2021-05-21 DIAGNOSIS — I10 Essential (primary) hypertension: Secondary | ICD-10-CM | POA: Insufficient documentation

## 2021-05-21 DIAGNOSIS — R21 Rash and other nonspecific skin eruption: Secondary | ICD-10-CM | POA: Diagnosis present

## 2021-05-21 NOTE — ED Triage Notes (Signed)
Pt with complaints of "bumps" to right upper arm that started itching this morning. Pt was seen here on 1/3 for the same, only the other arm. Pt was Rx cortisone cream and benadryl but has not used those medications on this rash.

## 2021-05-22 ENCOUNTER — Emergency Department (HOSPITAL_COMMUNITY)
Admission: EM | Admit: 2021-05-22 | Discharge: 2021-05-22 | Disposition: A | Discharge status: Home / Self Care | Payer: Medicaid Other | Attending: Emergency Medicine | Admitting: Emergency Medicine

## 2021-05-22 DIAGNOSIS — L509 Urticaria, unspecified: Secondary | ICD-10-CM

## 2021-05-22 MED ORDER — HYDROXYZINE HCL 25 MG PO TABS
25.0000 mg | ORAL_TABLET | Freq: Once | ORAL | Status: AC
  Administered 2021-05-22: 25 mg via ORAL
  Filled 2021-05-22: qty 1

## 2021-05-22 MED ORDER — PREDNISONE 10 MG PO TABS: 20.0000 mg | ORAL_TABLET | Freq: Two times a day (BID) | ORAL | 0 refills | Status: DC

## 2021-05-22 MED ORDER — HYDROXYZINE HCL 25 MG PO TABS: 25.0000 mg | ORAL_TABLET | Freq: Four times a day (QID) | ORAL | 0 refills | Status: DC

## 2021-05-22 MED ORDER — PREDNISONE 20 MG PO TABS
20.0000 mg | ORAL_TABLET | Freq: Once | ORAL | Status: AC
  Administered 2021-05-22: 20 mg via ORAL
  Filled 2021-05-22: qty 1

## 2021-05-22 NOTE — ED Provider Notes (Signed)
[facility] EMERGENCY DEPARTMENT Provider Note   CSN: [id number] Arrival date & time: 05/21/21  2249     History  Chief Complaint  Patient presents with   Rash    Denise Walters is a 41 y.o. female.  Patient is a 41 year old female with past medical history of hypertension, GERD.  Patient presenting today with complaints of rash.  She describes red, blotchy areas to her arms and back that began 1 week ago.  She denies any new contacts or exposures.  She denies any throat swelling or wheezing.  She was seen here last week and prescribed Benadryl which helped for a short time, now rash has returned.  The history is provided by the patient.  Rash Location:  Torso and shoulder/arm Severity:  Moderate Onset quality:  Gradual Duration:  1 week Timing:  Constant Progression:  Worsening Chronicity:  New Context comment:  Unknown exposure Relieved by:  Nothing Worsened by:  Nothing Ineffective treatments:  Antihistamines     Home Medications Prior to Admission medications   Medication Sig Start Date End Date Taking? Authorizing Provider  amLODipine (NORVASC) 10 MG tablet Take 10 mg by mouth daily.    [provider]  carvedilol (COREG) 25 MG tablet Take 25 mg by mouth 2 (two) times daily. 07/25/17   [provider]  doxycycline (VIBRAMYCIN) 100 MG capsule Take 1 capsule (100 mg total) by mouth 2 (two) times daily. 12/12/18   Liberty Handy, PA-C  lisinopril (PRINIVIL,ZESTRIL) 20 MG tablet Take 1 tablet by mouth daily. 07/25/17   [provider]  lisinopril-hydrochlorothiazide (PRINZIDE,ZESTORETIC) 20-25 MG tablet Take 1 tablet by mouth daily. Add to the lisinopril 20 mg    [provider]  loratadine (CLARITIN) 10 MG tablet Take 1 tablet (10 mg total) by mouth daily. 10/10/20 11/09/20  Couture, Cortni S, PA-C  pantoprazole (PROTONIX) 40 MG tablet Take 1 tablet (40 mg total) by mouth daily before breakfast. 02/11/20   Elson Areas, PA-C   predniSONE (DELTASONE) 20 MG tablet Take 2 tablets (40 mg total) by mouth daily. 04/26/21   Triplett, Tammy, PA-C      Allergies    Chocolate    Review of Systems   Review of Systems  Skin:  Positive for rash.  All other systems reviewed and are negative.  Physical Exam Updated Vital Signs BP (!) 159/112 (BP Location: Right Arm) Comment: pt reports she "always has high BP"   Pulse 100    Temp 98.3 F (36.8 C) (Oral)    Resp 17    Ht 5\' 2"  (1.575 m)    Wt 131.5 kg    LMP 04/23/2021 (Approximate)    SpO2 99%    BMI 53.04 kg/m  Physical Exam Vitals and nursing note reviewed.  Constitutional:      General: She is not in acute distress.    Appearance: She is well-developed. She is not diaphoretic.  HENT:     Head: Normocephalic and atraumatic.  Cardiovascular:     Rate and Rhythm: Normal rate and regular rhythm.     Heart sounds: No murmur heard.   No friction rub. No gallop.  Pulmonary:     Effort: Pulmonary effort is normal. No respiratory distress.     Breath sounds: Normal breath sounds. No wheezing.  Abdominal:     General: Bowel sounds are normal. There is no distension.     Palpations: Abdomen is soft.     Tenderness: There is no abdominal tenderness.  Musculoskeletal:        General: Normal range of motion.     Cervical back: Normal range of motion and neck supple.  Skin:    General: Skin is warm and dry.     Comments: There are urticarial lesions noted to the backs of both arms and lower back.  Neurological:     General: No focal deficit present.     Mental Status: She is alert and oriented to person, place, and time.    ED Results / Procedures / Treatments   Labs (all labs ordered are listed, but only abnormal results are displayed) Labs Reviewed - No data to display  EKG None  Radiology No results found.  Procedures Procedures    Medications Ordered in ED Medications  predniSONE (DELTASONE) tablet 20 mg (has no administration in time range)   hydrOXYzine (ATARAX) tablet 25 mg (has no administration in time range)    ED Course/ Medical Decision Making/ A&P  Patient presenting with rash most consistent with urticaria.  She has no new contacts or exposures, so I am uncertain as to the inciting event.  She will be treated with prednisone and hydroxyzine.  She is to follow-up as needed if not improving.  Final Clinical Impression(s) / ED Diagnoses Final diagnoses:  None    Rx / DC Orders ED Discharge Orders     None         Geoffery Lyons, MD 05/22/21 [phone]

## 2021-05-22 NOTE — Discharge Instructions (Signed)
Begin taking prednisone and hydroxyzine as prescribed.  Follow-up with primary doctor if not improving in the next week, and return to the ER if you develop throat swelling, difficulty breathing, or other new and concerning symptoms.

## 2021-07-19 ENCOUNTER — Encounter: Payer: Self-pay | Admitting: *Deleted

## 2022-01-22 ENCOUNTER — Emergency Department (HOSPITAL_COMMUNITY)
Admission: EM | Admit: 2022-01-22 | Discharge: 2022-01-22 | Disposition: A | Discharge status: Home / Self Care | Payer: Self-pay | Attending: Emergency Medicine | Admitting: Emergency Medicine

## 2022-01-22 ENCOUNTER — Encounter (HOSPITAL_COMMUNITY): Payer: Self-pay | Admitting: *Deleted

## 2022-01-22 ENCOUNTER — Other Ambulatory Visit: Payer: Self-pay

## 2022-01-22 DIAGNOSIS — I10 Essential (primary) hypertension: Secondary | ICD-10-CM | POA: Insufficient documentation

## 2022-01-22 DIAGNOSIS — Z79899 Other long term (current) drug therapy: Secondary | ICD-10-CM | POA: Insufficient documentation

## 2022-01-22 DIAGNOSIS — Z20822 Contact with and (suspected) exposure to covid-19: Secondary | ICD-10-CM | POA: Insufficient documentation

## 2022-01-22 DIAGNOSIS — Z1152 Encounter for screening for COVID-19: Secondary | ICD-10-CM | POA: Insufficient documentation

## 2022-01-22 LAB — SARS CORONAVIRUS 2 BY RT PCR: SARS Coronavirus 2 by RT PCR: NEGATIVE

## 2022-01-22 NOTE — ED Provider Notes (Signed)
[facility] EMERGENCY DEPARTMENT Provider Note   CSN: [id number] Arrival date & time: 01/22/22  0749     History  Chief Complaint  Patient presents with   wants covid test    MAUDELL STANBROUGH is a 41 y.o. female.  HPI   History of hypertension, chronic headaches presents with complaints of needing a COVID test.  States that she was exposed someone with COVID on Friday, endorsing no symptoms she is not immunocompromise did not get her COVID or her influenza vaccine.  Not endorsing fevers chills cough congestion stomach pains nausea vomiting diarrhea no general body aches.  Home Medications Prior to Admission medications   Medication Sig Start Date End Date Taking? Authorizing Provider  amLODipine (NORVASC) 10 MG tablet Take 10 mg by mouth daily.    [provider]  carvedilol (COREG) 25 MG tablet Take 25 mg by mouth 2 (two) times daily. 07/25/17   [provider]  doxycycline (VIBRAMYCIN) 100 MG capsule Take 1 capsule (100 mg total) by mouth 2 (two) times daily. 12/12/18   Liberty Handy, PA-C  hydrOXYzine (ATARAX) 25 MG tablet Take 1 tablet (25 mg total) by mouth every 6 (six) hours. 05/22/21   Geoffery Lyons, MD  lisinopril (PRINIVIL,ZESTRIL) 20 MG tablet Take 1 tablet by mouth daily. 07/25/17   [provider]  lisinopril-hydrochlorothiazide (PRINZIDE,ZESTORETIC) 20-25 MG tablet Take 1 tablet by mouth daily. Add to the lisinopril 20 mg    [provider]  loratadine (CLARITIN) 10 MG tablet Take 1 tablet (10 mg total) by mouth daily. 10/10/20 11/09/20  Couture, Cortni S, PA-C  pantoprazole (PROTONIX) 40 MG tablet Take 1 tablet (40 mg total) by mouth daily before breakfast. 02/11/20   Elson Areas, PA-C  predniSONE (DELTASONE) 10 MG tablet Take 2 tablets (20 mg total) by mouth 2 (two) times daily with a meal. 05/22/21   Geoffery Lyons, MD      Allergies    Chocolate    Review of Systems   Review of Systems  Constitutional:  Negative for chills  and fever.  Respiratory:  Negative for shortness of breath.   Cardiovascular:  Negative for chest pain.  Gastrointestinal:  Negative for abdominal pain.  Neurological:  Negative for headaches.    Physical Exam Updated Vital Signs BP (!) 155/96   Pulse 89   Temp 98.5 F (36.9 C) (Oral)   Resp 20   Ht 5\' 2"  (1.575 m)   Wt 131.1 kg   SpO2 96%   BMI 52.86 kg/m  Physical Exam Vitals and nursing note reviewed.  Constitutional:      General: She is not in acute distress.    Appearance: Normal appearance. She is not ill-appearing or diaphoretic.  HENT:     Head: Normocephalic and atraumatic.     Nose: No congestion or rhinorrhea.  Eyes:     Conjunctiva/sclera: Conjunctivae normal.  Cardiovascular:     Rate and Rhythm: Normal rate.  Pulmonary:     Effort: Pulmonary effort is normal.  Musculoskeletal:     Cervical back: Neck supple.  Skin:    General: Skin is warm and dry.  Neurological:     Mental Status: She is alert.  Psychiatric:        Mood and Affect: Mood normal.     ED Results / Procedures / Treatments   Labs (all labs ordered are listed, but only abnormal results are displayed) Labs Reviewed  SARS CORONAVIRUS 2 BY RT PCR    EKG  None  Radiology No results found.  Procedures Procedures    Medications Ordered in ED Medications - No data to display  ED Course/ Medical Decision Making/ A&P                           Medical Decision Making  This patient presents to the ED for concern of COVID test, this involves an extensive number of treatment options, and is a complaint that carries with it a high risk of complications and morbidity.  The differential diagnosis includes pneumonia, asthma exacerbation, URI    Additional history obtained:  Additional history obtained from N/A External records from outside source obtained and reviewed including N/A   Co morbidities that complicate the patient evaluation  N/A  Social Determinants of  Health:  N/A    Lab Tests:  I Ordered, and personally interpreted labs.  The pertinent results include COVID test negative   Imaging Studies ordered:  I ordered imaging studies including N/A I independently visualized and interpreted imaging which showed N/A I agree with the radiologist interpretation   Cardiac Monitoring:  The patient was maintained on a cardiac monitor.  I personally viewed and interpreted the cardiac monitored which showed an underlying rhythm of: N/A   Medicines ordered and prescription drug management:  I ordered medication including n/a I have reviewed the patients home medicines and have made adjustments as needed  Critical Interventions:  N/a   Reevaluation:  COVID test negative benign physical exam ready for discharge  Consultations Obtained:  N/a    Test Considered:  N/a    Rule out Low suspicion for systemic infection as patient is nontoxic-appearing, vital signs reassuring, no obvious source infection noted on exam.  Low suspicion for pneumonia as lung sounds are clear bilaterally,  I have low suspicion for PE as patient denies pleuritic chest pain, shortness of breath,patient is PERC. low suspicion for strep throat as oropharynx was visualized, no erythema or exudates noted.  Low suspicion patient would need  hospitalized due to viral infection or Covid as vital signs reassuring, patient is not in respiratory distress.      Dispostion and problem list  After consideration of the diagnostic results and the patients response to treatment, I feel that the patent would benefit from discharge.  1.  COVID testing-negative follow-up PCP as needed        Final Clinical Impression(s) / ED Diagnoses Final diagnoses:  [id number] ruled out    Rx / DC Orders ED Discharge Orders     None         Marcello Fennel, PA-C 01/22/22 1034    Elgie Congo, MD 01/22/22 1950

## 2022-01-22 NOTE — Discharge Instructions (Signed)
COVID test is negative  Follow-up PCP as needed   Come back to the emergency department if you develop chest pain, shortness of breath, severe abdominal pain, uncontrolled nausea, vomiting, diarrhea.

## 2022-01-22 NOTE — ED Triage Notes (Signed)
Pt states she was around someone with covid and wants a covid; pt denies any symptoms

## 2022-12-21 ENCOUNTER — Emergency Department (HOSPITAL_COMMUNITY)
Admission: EM | Admit: 2022-12-21 | Discharge: 2022-12-21 | Disposition: A | Discharge status: Home / Self Care | Payer: Medicaid Other | Attending: Emergency Medicine | Admitting: Emergency Medicine

## 2022-12-21 ENCOUNTER — Other Ambulatory Visit: Payer: Self-pay

## 2022-12-21 ENCOUNTER — Encounter (HOSPITAL_COMMUNITY): Payer: Self-pay | Admitting: *Deleted

## 2022-12-21 DIAGNOSIS — J029 Acute pharyngitis, unspecified: Secondary | ICD-10-CM | POA: Diagnosis present

## 2022-12-21 DIAGNOSIS — Z1152 Encounter for screening for COVID-19: Secondary | ICD-10-CM | POA: Insufficient documentation

## 2022-12-21 DIAGNOSIS — J020 Streptococcal pharyngitis: Secondary | ICD-10-CM | POA: Insufficient documentation

## 2022-12-21 LAB — SARS CORONAVIRUS 2 BY RT PCR: SARS Coronavirus 2 by RT PCR: NEGATIVE

## 2022-12-21 LAB — GROUP A STREP BY PCR: Group A Strep by PCR: DETECTED — AB

## 2022-12-21 MED ORDER — AMOXICILLIN 250 MG PO CAPS
1000.0000 mg | ORAL_CAPSULE | Freq: Once | ORAL | Status: AC
  Administered 2022-12-21: 1000 mg via ORAL
  Filled 2022-12-21: qty 4

## 2022-12-21 MED ORDER — AMOXICILLIN 875 MG PO TABS: 875.0000 mg | ORAL_TABLET | Freq: Two times a day (BID) | ORAL | 0 refills | Status: AC

## 2022-12-21 MED ORDER — IBUPROFEN 400 MG PO TABS
600.0000 mg | ORAL_TABLET | Freq: Once | ORAL | Status: AC
  Administered 2022-12-21: 600 mg via ORAL
  Filled 2022-12-21: qty 2

## 2022-12-21 NOTE — ED Triage Notes (Signed)
Pt has sore throat x2 days with increasing pain with swallowing.  No fever or chills

## 2022-12-21 NOTE — ED Provider Notes (Signed)
[place] EMERGENCY DEPARTMENT AT [facility] Provider Note   CSN: [id number] Arrival date & time: 12/21/22  2113     History  Chief Complaint  Patient presents with   Sore Throat   HPI Denise Walters is a 42 y.o. female presenting for sore throat.  Started 2 days ago.  Does have pain with swallowing.  Denies fever and cough at this time.  Denies sick contacts.   Sore Throat       Home Medications Prior to Admission medications   Medication Sig Start Date End Date Taking? Authorizing Provider  amLODipine (NORVASC) 10 MG tablet Take 10 mg by mouth daily.    [provider]  carvedilol (COREG) 25 MG tablet Take 25 mg by mouth 2 (two) times daily. 07/25/17   [provider]  doxycycline (VIBRAMYCIN) 100 MG capsule Take 1 capsule (100 mg total) by mouth 2 (two) times daily. 12/12/18   Liberty Handy, PA-C  hydrOXYzine (ATARAX) 25 MG tablet Take 1 tablet (25 mg total) by mouth every 6 (six) hours. 05/22/21   Geoffery Lyons, MD  lisinopril (PRINIVIL,ZESTRIL) 20 MG tablet Take 1 tablet by mouth daily. 07/25/17   [provider]  lisinopril-hydrochlorothiazide (PRINZIDE,ZESTORETIC) 20-25 MG tablet Take 1 tablet by mouth daily. Add to the lisinopril 20 mg    [provider]  loratadine (CLARITIN) 10 MG tablet Take 1 tablet (10 mg total) by mouth daily. 10/10/20 11/09/20  Couture, Cortni S, PA-C  pantoprazole (PROTONIX) 40 MG tablet Take 1 tablet (40 mg total) by mouth daily before breakfast. 02/11/20   Elson Areas, PA-C  predniSONE (DELTASONE) 10 MG tablet Take 2 tablets (20 mg total) by mouth 2 (two) times daily with a meal. 05/22/21   Geoffery Lyons, MD      Allergies    Chocolate    Review of Systems   See HPI for pertinent positives  Physical Exam Updated Vital Signs BP (!) 161/116 (BP Location: Right Arm)   Pulse (!) 107   Temp 98.7 F (37.1 C) (Oral)   Resp 16   Ht 5\' 2"  (1.575 m)   Wt 131.1 kg   SpO2 98%   BMI 52.86  kg/m  Physical Exam Constitutional:      Appearance: Normal appearance.  HENT:     Head: Normocephalic.     Nose: Nose normal.     Mouth/Throat:     Mouth: Mucous membranes are moist.     Tongue: No lesions. Tongue does not deviate from midline.     Palate: No mass and lesions.     Pharynx: Uvula midline. Pharyngeal swelling and posterior oropharyngeal erythema present. No oropharyngeal exudate or uvula swelling.     Tonsils: No tonsillar exudate or tonsillar abscesses.  Eyes:     Conjunctiva/sclera: Conjunctivae normal.  Pulmonary:     Effort: Pulmonary effort is normal.  Neurological:     Mental Status: She is alert.  Psychiatric:        Mood and Affect: Mood normal.     ED Results / Procedures / Treatments   Labs (all labs ordered are listed, but only abnormal results are displayed) Labs Reviewed  GROUP A STREP BY PCR - Abnormal; Notable for the following components:      Result Value   Group A Strep by PCR DETECTED (*)    All other components within normal limits  SARS CORONAVIRUS 2 BY RT PCR    EKG None  Radiology No results found.  Procedures Procedures    Medications Ordered in ED Medications  amoxicillin (AMOXIL) capsule 1,000 mg (1,000 mg Oral Given 12/21/22 2255)  ibuprofen (ADVIL) tablet 600 mg (600 mg Oral Given 12/21/22 2255)    ED Course/ Medical Decision Making/ A&P                                 Medical Decision Making Risk Prescription drug management.   42 year old well-appearing female presenting for sore throat.  Exam notable for erythema and edema in the posterior oropharynx.  Strep PCR was positive.  Symptoms and clinical findings consistent with strep pharyngitis.  Treated with amoxicillin and ibuprofen.  Clinical findings do not suggest tonsillar peritonsillar abscess.  Advised to continue the amoxicillin at home and treat conservatively.  Vital stable.  Discharged home in good condition.        Final Clinical Impression(s) /  ED Diagnoses Final diagnoses:  Strep pharyngitis    Rx / DC Orders ED Discharge Orders     None         Gareth Eagle, PA-C 12/21/22 2303    Vanetta Mulders, MD 12/22/22 [phone]

## 2022-12-21 NOTE — Discharge Instructions (Addendum)
Evaluation today revealed that you have strep throat.  This is likely causing her symptoms.  I am going to start you on amoxicillin.  Please take the entire course even if you start to feel better.  You can also take Tylenol and ibuprofen as you needed.

## 2022-12-21 NOTE — ED Notes (Signed)
PA at bedside.

## 2023-03-06 ENCOUNTER — Encounter (HOSPITAL_COMMUNITY): Payer: Self-pay | Admitting: Emergency Medicine

## 2023-03-06 ENCOUNTER — Other Ambulatory Visit: Payer: Self-pay

## 2023-03-06 ENCOUNTER — Emergency Department (HOSPITAL_COMMUNITY): Payer: Medicaid Other

## 2023-03-06 ENCOUNTER — Emergency Department (HOSPITAL_COMMUNITY)
Admission: EM | Admit: 2023-03-06 | Discharge: 2023-03-06 | Disposition: A | Discharge status: Home / Self Care | Payer: Medicaid Other | Attending: Emergency Medicine | Admitting: Emergency Medicine

## 2023-03-06 DIAGNOSIS — Z79899 Other long term (current) drug therapy: Secondary | ICD-10-CM | POA: Diagnosis not present

## 2023-03-06 DIAGNOSIS — D649 Anemia, unspecified: Secondary | ICD-10-CM | POA: Diagnosis not present

## 2023-03-06 DIAGNOSIS — I10 Essential (primary) hypertension: Secondary | ICD-10-CM | POA: Insufficient documentation

## 2023-03-06 DIAGNOSIS — K5900 Constipation, unspecified: Secondary | ICD-10-CM | POA: Diagnosis present

## 2023-03-06 DIAGNOSIS — E876 Hypokalemia: Secondary | ICD-10-CM | POA: Diagnosis not present

## 2023-03-06 LAB — CBC WITH DIFFERENTIAL/PLATELET
Abs Immature Granulocytes: 0.03 10*3/uL (ref 0.00–0.07)
Basophils Absolute: 0.1 10*3/uL (ref 0.0–0.1)
Basophils Relative: 1 %
Eosinophils Absolute: 0.2 10*3/uL (ref 0.0–0.5)
Eosinophils Relative: 2 %
HCT: 34.2 % — ABNORMAL LOW (ref 36.0–46.0)
Hemoglobin: 10.1 g/dL — ABNORMAL LOW (ref 12.0–15.0)
Immature Granulocytes: 0 %
Lymphocytes Relative: 29 %
Lymphs Abs: 2.7 10*3/uL (ref 0.7–4.0)
MCH: 25.6 pg — ABNORMAL LOW (ref 26.0–34.0)
MCHC: 29.5 g/dL — ABNORMAL LOW (ref 30.0–36.0)
MCV: 86.6 fL (ref 80.0–100.0)
Monocytes Absolute: 0.6 10*3/uL (ref 0.1–1.0)
Monocytes Relative: 6 %
Neutro Abs: 5.5 10*3/uL (ref 1.7–7.7)
Neutrophils Relative %: 62 %
Platelets: 563 10*3/uL — ABNORMAL HIGH (ref 150–400)
RBC: 3.95 MIL/uL (ref 3.87–5.11)
RDW: 15.1 % (ref 11.5–15.5)
WBC: 9 10*3/uL (ref 4.0–10.5)
nRBC: 0 % (ref 0.0–0.2)

## 2023-03-06 LAB — COMPREHENSIVE METABOLIC PANEL
ALT: 11 U/L (ref 0–44)
AST: 11 U/L — ABNORMAL LOW (ref 15–41)
Albumin: 3.4 g/dL — ABNORMAL LOW (ref 3.5–5.0)
Alkaline Phosphatase: 57 U/L (ref 38–126)
Anion gap: 9 (ref 5–15)
BUN: 13 mg/dL (ref 6–20)
CO2: 27 mmol/L (ref 22–32)
Calcium: 8.8 mg/dL — ABNORMAL LOW (ref 8.9–10.3)
Chloride: 100 mmol/L (ref 98–111)
Creatinine, Ser: 0.81 mg/dL (ref 0.44–1.00)
GFR, Estimated: >60 mL/min (ref >60)
Glucose, Bld: 94 mg/dL (ref 70–99)
Potassium: 3.1 mmol/L — ABNORMAL LOW (ref 3.5–5.1)
Sodium: 136 mmol/L (ref 135–145)
Total Bilirubin: 0.5 mg/dL (ref <1.2)
Total Protein: 8 g/dL (ref 6.5–8.1)

## 2023-03-06 LAB — URINALYSIS, ROUTINE W REFLEX MICROSCOPIC
Bilirubin Urine: NEGATIVE
Glucose, UA: NEGATIVE mg/dL
Ketones, ur: NEGATIVE mg/dL
Leukocytes,Ua: NEGATIVE
Nitrite: NEGATIVE
Protein, ur: NEGATIVE mg/dL
Specific Gravity, Urine: 1.015 (ref 1.005–1.030)
pH: 6 (ref 5.0–8.0)

## 2023-03-06 MED ORDER — POTASSIUM CHLORIDE 20 MEQ PO PACK
40.0000 meq | PACK | Freq: Once | ORAL | Status: AC
  Administered 2023-03-06: 40 meq via ORAL
  Filled 2023-03-06: qty 2

## 2023-03-06 MED ORDER — MAGNESIUM CITRATE PO SOLN: 1.0000 | Freq: Once | ORAL | 0 refills | Status: AC

## 2023-03-06 MED ORDER — POLYETHYLENE GLYCOL 3350 17 G PO PACK: 17.0000 g | PACK | Freq: Every day | ORAL | 0 refills | Status: DC

## 2023-03-06 NOTE — Discharge Instructions (Signed)
Evaluation for constipation is overall reassuring.  I am sending you home with magnesium citrate and MiraLAX.  Please follow-up with your PCP.  I do suspect that your iron supplements likely contributing to your constipation in someway.  Your labs indicate that your anemic but your hemoglobin is near baseline.  Recommend he continues taking iron supplements until you discuss with your PCP.  If you develop abdominal pain, fever, no longer passing gas, nausea and vomiting or any other concerning symptom please return emergency department further evaluation.

## 2023-03-06 NOTE — ED Triage Notes (Signed)
Pt presents with constipation x 1.5 weeks, per pt she is taking iron pills, that has caused constipation, PCP advise lactalose, taken, no results.

## 2023-03-06 NOTE — ED Provider Notes (Signed)
[place] EMERGENCY DEPARTMENT AT [facility] Provider Note   CSN: [id number] Arrival date & time: 03/06/23  1136     History  Chief Complaint  Patient presents with   Constipation   HPI THERES Walters is a 42 y.o. female with history of anemia and hypertension presenting for constipation.  Has been going on for couple weeks.  States she did have a bowel movement yesterday.  She has been taking iron pills daily for her anemia.  PCP also started her on lactulose for her issues with constipation.  Denies abdominal pain.  Denies nausea vomiting.  States she did have cholecystectomy in 2005.  States she is still passing flatus.   Constipation      Home Medications Prior to Admission medications   Medication Sig Start Date End Date Taking? Authorizing Provider  magnesium citrate SOLN Take 296 mLs (1 Bottle total) by mouth once for 1 dose. 03/06/23 03/06/23 Yes Gareth Eagle, PA-C  polyethylene glycol (MIRALAX) 17 g packet Take 17 g by mouth daily. 03/06/23  Yes Gareth Eagle, PA-C  amLODipine (NORVASC) 10 MG tablet Take 10 mg by mouth daily.    [provider]  carvedilol (COREG) 25 MG tablet Take 25 mg by mouth 2 (two) times daily. 07/25/17   [provider]  doxycycline (VIBRAMYCIN) 100 MG capsule Take 1 capsule (100 mg total) by mouth 2 (two) times daily. 12/12/18   Liberty Handy, PA-C  hydrOXYzine (ATARAX) 25 MG tablet Take 1 tablet (25 mg total) by mouth every 6 (six) hours. 05/22/21   Geoffery Lyons, MD  lisinopril (PRINIVIL,ZESTRIL) 20 MG tablet Take 1 tablet by mouth daily. 07/25/17   [provider]  lisinopril-hydrochlorothiazide (PRINZIDE,ZESTORETIC) 20-25 MG tablet Take 1 tablet by mouth daily. Add to the lisinopril 20 mg    [provider]  loratadine (CLARITIN) 10 MG tablet Take 1 tablet (10 mg total) by mouth daily. 10/10/20 11/09/20  Couture, Cortni S, PA-C  pantoprazole (PROTONIX) 40 MG tablet Take 1 tablet (40 mg  total) by mouth daily before breakfast. 02/11/20   Elson Areas, PA-C  predniSONE (DELTASONE) 10 MG tablet Take 2 tablets (20 mg total) by mouth 2 (two) times daily with a meal. 05/22/21   Geoffery Lyons, MD      Allergies    Chocolate    Review of Systems   Review of Systems  Gastrointestinal:  Positive for constipation.    Physical Exam Updated Vital Signs BP (!) 149/103 (BP Location: Left Arm)   Pulse 92   Temp 98.4 F (36.9 C) (Oral)   Resp 16   Ht 5\' 2"  (1.575 m)   Wt 126.1 kg   LMP 02/27/2023   SpO2 100%   BMI 50.85 kg/m  Physical Exam Vitals and nursing note reviewed.  HENT:     Head: Normocephalic and atraumatic.     Mouth/Throat:     Mouth: Mucous membranes are moist.  Eyes:     General:        Right eye: No discharge.        Left eye: No discharge.     Conjunctiva/sclera: Conjunctivae normal.  Cardiovascular:     Rate and Rhythm: Normal rate and regular rhythm.     Pulses: Normal pulses.     Heart sounds: Normal heart sounds.  Pulmonary:     Effort: Pulmonary effort is normal.     Breath sounds: Normal breath sounds.  Abdominal:     General: Abdomen  is flat. There is no distension.     Palpations: Abdomen is soft.     Tenderness: There is no abdominal tenderness.  Skin:    General: Skin is warm and dry.  Neurological:     General: No focal deficit present.  Psychiatric:        Mood and Affect: Mood normal.     ED Results / Procedures / Treatments   Labs (all labs ordered are listed, but only abnormal results are displayed) Labs Reviewed  URINALYSIS, ROUTINE W REFLEX MICROSCOPIC - Abnormal; Notable for the following components:      Result Value   Hgb urine dipstick LARGE (*)    Bacteria, UA RARE (*)    All other components within normal limits  CBC WITH DIFFERENTIAL/PLATELET - Abnormal; Notable for the following components:   Hemoglobin 10.1 (*)    HCT 34.2 (*)    MCH 25.6 (*)    MCHC 29.5 (*)    Platelets 563 (*)    All other  components within normal limits  COMPREHENSIVE METABOLIC PANEL - Abnormal; Notable for the following components:   Potassium 3.1 (*)    Calcium 8.8 (*)    Albumin 3.4 (*)    AST 11 (*)    All other components within normal limits    EKG None  Radiology DG Abdomen 1 View  Result Date: 03/06/2023 CLINICAL DATA:  Constipation EXAM: ABDOMEN - 1 VIEW COMPARISON:  CT 07/30/2017 FINDINGS: Clips in the right upper quadrant. Nonobstructed gas pattern with mild to moderate stool. Probable phleboliths left pelvis. IMPRESSION: Nonobstructed gas pattern with average stool burden. Electronically Signed   By: Jasmine Pang M.D.   On: 03/06/2023 16:23    Procedures Procedures    Medications Ordered in ED Medications  potassium chloride (KLOR-CON) packet 40 mEq (has no administration in time range)    ED Course/ Medical Decision Making/ A&P                                 Medical Decision Making Amount and/or Complexity of Data Reviewed Labs: ordered. Radiology: ordered.  Risk Prescription drug management.   Initial Impression and Ddx 42 year old well-appearing female presenting for constipation.  Exam was unremarkable.  DDx includes bowel obstruction, intra-abdominal infection, constipation, other. Patient PMH that increases complexity of ED encounter: Anemia taking iron supplements  Interpretation of Diagnostics - I independent reviewed and interpreted the labs as followed: anemia (near baseline), hypokalemia  - I independently visualized the following imaging with scope of interpretation limited to determining acute life threatening conditions related to emergency care: KUB, which revealed nonobstructive gas pattern with average stool burden  Patient Reassessment and Ultimate Disposition/Management Workup overall reassuring.  Doubt bowel obstruction or intra-abdominal infection given benign abdominal exam and reassuring labs.  Suspect her constipation is likely related to her iron  supplements she is taking.  Sent MiraLAX and magnesium citrate to her pharmacy.  Advised anticipatory guidance with regard to taking the magnesium at home.  Recommend follow-up with her PCP regarding her iron supplements.  Vital stable.  Discussed return precautions.  Discharged in good condition.  Patient management required discussion with the following services or consulting groups:  None  Complexity of Problems Addressed Acute complicated illness or Injury  Additional Data Reviewed and Analyzed Further history obtained from: Past medical history and medications listed in the EMR, Prior ED visit notes, and Recent discharge summary  Patient Encounter Risk  Assessment Prescriptions         Final Clinical Impression(s) / ED Diagnoses Final diagnoses:  Constipation, unspecified constipation type    Rx / DC Orders ED Discharge Orders          Ordered    polyethylene glycol (MIRALAX) 17 g packet  Daily        03/06/23 1659    magnesium citrate SOLN   Once        03/06/23 1659              Gareth Eagle, PA-C 03/06/23 1701    Bethann Berkshire, MD 03/09/23 [phone]

## 2023-03-12 ENCOUNTER — Encounter (INDEPENDENT_AMBULATORY_CARE_PROVIDER_SITE_OTHER): Payer: Self-pay | Admitting: *Deleted

## 2023-03-12 ENCOUNTER — Telehealth: Payer: Self-pay | Admitting: Gastroenterology

## 2023-03-12 NOTE — Telephone Encounter (Signed)
Patient left a message that it is time for her to schedule her colonoscopy.  The last one she had was in 2018 and according to Dr. Darrick Penna' note she was due in 2023.  Can you send her a questionnaire

## 2023-03-13 ENCOUNTER — Encounter: Payer: Self-pay | Admitting: *Deleted

## 2023-03-13 NOTE — Telephone Encounter (Signed)
Questionnaire mailed to patient

## 2023-04-14 ENCOUNTER — Other Ambulatory Visit: Payer: Self-pay

## 2023-04-14 ENCOUNTER — Encounter (HOSPITAL_COMMUNITY): Payer: Self-pay

## 2023-04-14 ENCOUNTER — Emergency Department (HOSPITAL_COMMUNITY)
Admission: EM | Admit: 2023-04-14 | Discharge: 2023-04-14 | Disposition: A | Discharge status: Home / Self Care | Payer: Medicaid Other | Attending: Emergency Medicine | Admitting: Emergency Medicine

## 2023-04-14 DIAGNOSIS — S40862A Insect bite (nonvenomous) of left upper arm, initial encounter: Secondary | ICD-10-CM | POA: Insufficient documentation

## 2023-04-14 DIAGNOSIS — W57XXXA Bitten or stung by nonvenomous insect and other nonvenomous arthropods, initial encounter: Secondary | ICD-10-CM | POA: Insufficient documentation

## 2023-04-14 DIAGNOSIS — I10 Essential (primary) hypertension: Secondary | ICD-10-CM | POA: Insufficient documentation

## 2023-04-14 DIAGNOSIS — Z79899 Other long term (current) drug therapy: Secondary | ICD-10-CM | POA: Diagnosis not present

## 2023-04-14 MED ORDER — PREDNISONE 50 MG PO TABS
60.0000 mg | ORAL_TABLET | Freq: Once | ORAL | Status: AC
  Administered 2023-04-14: 60 mg via ORAL
  Filled 2023-04-14: qty 1

## 2023-04-14 MED ORDER — DIPHENHYDRAMINE HCL 25 MG PO TABS: 25.0000 mg | ORAL_TABLET | Freq: Four times a day (QID) | ORAL | 0 refills | Status: DC

## 2023-04-14 MED ORDER — DIPHENHYDRAMINE HCL 25 MG PO CAPS
25.0000 mg | ORAL_CAPSULE | Freq: Once | ORAL | Status: AC
  Administered 2023-04-14: 25 mg via ORAL
  Filled 2023-04-14: qty 1

## 2023-04-14 MED ORDER — PREDNISONE 10 MG PO TABS: ORAL_TABLET | ORAL | 0 refills | Status: DC

## 2023-04-14 NOTE — Discharge Instructions (Signed)
This does not appear to be an infected insect bite but rather a localized allergic reaction to what ever bit you.  I do recommend that you may continue using your topical hydrocortisone cream if you would like, however I am giving you prednisone tablets which is a similar medication and will be stronger than your topical hydrocortisone cream.  I also recommend Benadryl 4 times per day as prescribed which will also help with this reaction and itching.  Another treatment that can help with the itching symptom itself is Goldbond anti-itch cream or generic version of this product.  Plan to recheck if your symptoms are not resolving with this treatment plan, I also recommend continuing ice packs to help with swelling.

## 2023-04-14 NOTE — ED Provider Notes (Signed)
[place] EMERGENCY DEPARTMENT AT [facility] Provider Note   CSN: [id number] Arrival date & time: 04/14/23  1520     History  Chief Complaint  Patient presents with   Insect Bite    Denise Walters is a 42 y.o. female with a history of seizure disorder, hypertension and hypercholesterolemia presenting for evaluation of a suspected bug bite to her left upper arm.  She was indoors in her home yesterday when she felt something bite or sting her left posterior upper arm and since that event she has had swelling and itching at the site.  She denies pain, there is been no drainage from the bite site.  She denies fevers or chills or any other complaints.  She has applied topical hydrocortisone cream and Benadryl cream which helps some but not completely.  She states she had a difficult time sleeping last night as when the site is rubbed against fabric it becomes super itchy.  She did not see the culprit bug.  HPI     Home Medications Prior to Admission medications   Medication Sig Start Date End Date Taking? Authorizing Provider  diphenhydrAMINE (BENADRYL) 25 MG tablet Take 1 tablet (25 mg total) by mouth every 6 (six) hours. 04/14/23  Yes Yan Pankratz, Raynelle Fanning, PA-C  predniSONE (DELTASONE) 10 MG tablet Take 6 tablets day one, 5 tablets day two, 4 tablets day three, 3 tablets day four, 2 tablets day five, then 1 tablet day six 04/14/23  Yes Aura Bibby, Raynelle Fanning, PA-C  amLODipine (NORVASC) 10 MG tablet Take 10 mg by mouth daily.    [provider]  carvedilol (COREG) 25 MG tablet Take 25 mg by mouth 2 (two) times daily. 07/25/17   [provider]  doxycycline (VIBRAMYCIN) 100 MG capsule Take 1 capsule (100 mg total) by mouth 2 (two) times daily. 12/12/18   Liberty Handy, PA-C  lisinopril (PRINIVIL,ZESTRIL) 20 MG tablet Take 1 tablet by mouth daily. 07/25/17   [provider]  lisinopril-hydrochlorothiazide (PRINZIDE,ZESTORETIC) 20-25 MG tablet Take 1 tablet by mouth daily.  Add to the lisinopril 20 mg    [provider]  loratadine (CLARITIN) 10 MG tablet Take 1 tablet (10 mg total) by mouth daily. 10/10/20 11/09/20  Couture, Cortni S, PA-C  pantoprazole (PROTONIX) 40 MG tablet Take 1 tablet (40 mg total) by mouth daily before breakfast. 02/11/20   Elson Areas, PA-C  polyethylene glycol (MIRALAX) 17 g packet Take 17 g by mouth daily. 03/06/23   Gareth Eagle, PA-C      Allergies    Chocolate    Review of Systems   Review of Systems  Constitutional:  Negative for chills and fever.  HENT:  Negative for congestion.   Eyes: Negative.   Respiratory:  Negative for chest tightness and shortness of breath.   Cardiovascular:  Negative for chest pain.  Gastrointestinal:  Negative for nausea.  Genitourinary: Negative.   Musculoskeletal:  Negative for arthralgias, joint swelling and neck pain.  Skin: Negative.  Negative for rash and wound.       Negative except as mentioned in HPI.    Neurological:  Negative for dizziness, weakness, light-headedness, numbness and headaches.  Psychiatric/Behavioral: Negative.    All other systems reviewed and are negative.   Physical Exam Updated Vital Signs BP (!) 144/84   Pulse 95   Temp 99.2 F (37.3 C) (Oral)   Resp 16   Ht 5\' 2"  (1.575 m)   Wt 126.1 kg   SpO2 100%  BMI 50.85 kg/m  Physical Exam Vitals and nursing note reviewed.  Constitutional:      Appearance: She is well-developed.  HENT:     Head: Normocephalic and atraumatic.  Eyes:     Conjunctiva/sclera: Conjunctivae normal.  Cardiovascular:     Rate and Rhythm: Normal rate.     Heart sounds: Normal heart sounds.  Pulmonary:     Effort: Pulmonary effort is normal.     Breath sounds: Normal breath sounds. No wheezing.  Musculoskeletal:        General: Normal range of motion.     Cervical back: Normal range of motion.  Skin:    General: Skin is warm and dry.     Findings: Erythema present.     Comments: Mild pink flushing to her  left upper posterior arm approximately 6 cm in diameter, circular, soft and nontender.  There is no induration, it is not warm to touch.  There is a central tiny punctum, possibly bite site.  Neurological:     Mental Status: She is alert.     ED Results / Procedures / Treatments   Labs (all labs ordered are listed, but only abnormal results are displayed) Labs Reviewed - No data to display  EKG None  Radiology No results found.  Procedures Procedures    Medications Ordered in ED Medications  predniSONE (DELTASONE) tablet 60 mg (60 mg Oral Given 04/14/23 1603)  diphenhydrAMINE (BENADRYL) capsule 25 mg (25 mg Oral Given 04/14/23 1603)    ED Course/ Medical Decision Making/ A&P                                 Medical Decision Making History and exam suggesting localized histamine reaction to some type of insect bite or sting.  There is no evidence of cellulitis, abscess.  Since the Benadryl cream does not seem to be helping significantly, I have switched her to Benadryl tablets, also she was placed on a prednisone taper, encouraged that she may continue with her hydrocortisone topical cream as well, also suggested Goldbond anti-itch cream to help with the pruritus..  Follow-up anticipated, she was advised to follow-up with her primary doctor for symptoms persist or worsen.  Risk OTC drugs. Prescription drug management.           Final Clinical Impression(s) / ED Diagnoses Final diagnoses:  Insect bite of left upper arm, initial encounter    Rx / DC Orders ED Discharge Orders          Ordered    diphenhydrAMINE (BENADRYL) 25 MG tablet  Every 6 hours        04/14/23 1706    predniSONE (DELTASONE) 10 MG tablet        04/14/23 1706              Victoriano Lain 04/14/23 2328    Benjiman Core, MD 04/14/23 [phone]

## 2023-04-14 NOTE — ED Triage Notes (Signed)
Swelling to LEFT upper arm Roughly tennis ball size Pt stated she was bit by bug yesterday

## 2023-05-09 ENCOUNTER — Other Ambulatory Visit: Payer: Self-pay

## 2023-05-09 ENCOUNTER — Encounter (HOSPITAL_COMMUNITY): Payer: Self-pay | Admitting: *Deleted

## 2023-05-09 DIAGNOSIS — R112 Nausea with vomiting, unspecified: Secondary | ICD-10-CM | POA: Diagnosis not present

## 2023-05-09 DIAGNOSIS — R1011 Right upper quadrant pain: Secondary | ICD-10-CM | POA: Insufficient documentation

## 2023-05-09 DIAGNOSIS — R197 Diarrhea, unspecified: Secondary | ICD-10-CM | POA: Diagnosis not present

## 2023-05-09 DIAGNOSIS — Z79899 Other long term (current) drug therapy: Secondary | ICD-10-CM | POA: Diagnosis not present

## 2023-05-09 NOTE — ED Triage Notes (Signed)
 Pt c/o vomiting and diarrhea that started today  Pt c/o chills

## 2023-05-10 ENCOUNTER — Emergency Department (HOSPITAL_COMMUNITY)
Admission: EM | Admit: 2023-05-10 | Discharge: 2023-05-10 | Disposition: A | Discharge status: Home / Self Care | Payer: Medicaid Other | Attending: Emergency Medicine | Admitting: Emergency Medicine

## 2023-05-10 ENCOUNTER — Emergency Department (HOSPITAL_COMMUNITY): Payer: Medicaid Other

## 2023-05-10 DIAGNOSIS — R112 Nausea with vomiting, unspecified: Secondary | ICD-10-CM

## 2023-05-10 DIAGNOSIS — R1011 Right upper quadrant pain: Secondary | ICD-10-CM

## 2023-05-10 LAB — URINALYSIS, ROUTINE W REFLEX MICROSCOPIC
Bilirubin Urine: NEGATIVE
Glucose, UA: NEGATIVE mg/dL
Ketones, ur: NEGATIVE mg/dL
Leukocytes,Ua: NEGATIVE
Nitrite: NEGATIVE
Protein, ur: 30 mg/dL — AB
Specific Gravity, Urine: 1.02 (ref 1.005–1.030)
pH: 5 (ref 5.0–8.0)

## 2023-05-10 LAB — COMPREHENSIVE METABOLIC PANEL
ALT: 15 U/L (ref 0–44)
AST: 15 U/L (ref 15–41)
Albumin: 3.6 g/dL (ref 3.5–5.0)
Alkaline Phosphatase: 77 U/L (ref 38–126)
Anion gap: 9 (ref 5–15)
BUN: 13 mg/dL (ref 6–20)
CO2: 23 mmol/L (ref 22–32)
Calcium: 8.6 mg/dL — ABNORMAL LOW (ref 8.9–10.3)
Chloride: 106 mmol/L (ref 98–111)
Creatinine, Ser: 0.8 mg/dL (ref 0.44–1.00)
GFR, Estimated: >60 mL/min (ref >60)
Glucose, Bld: 140 mg/dL — ABNORMAL HIGH (ref 70–99)
Potassium: 3.1 mmol/L — ABNORMAL LOW (ref 3.5–5.1)
Sodium: 138 mmol/L (ref 135–145)
Total Bilirubin: 0.9 mg/dL (ref 0.0–1.2)
Total Protein: 8.5 g/dL — ABNORMAL HIGH (ref 6.5–8.1)

## 2023-05-10 LAB — CBC WITH DIFFERENTIAL/PLATELET
Abs Immature Granulocytes: 0.08 10*3/uL — ABNORMAL HIGH (ref 0.00–0.07)
Basophils Absolute: 0 10*3/uL (ref 0.0–0.1)
Basophils Relative: 0 %
Eosinophils Absolute: 0 10*3/uL (ref 0.0–0.5)
Eosinophils Relative: 0 %
HCT: 37.8 % (ref 36.0–46.0)
Hemoglobin: 11.2 g/dL — ABNORMAL LOW (ref 12.0–15.0)
Immature Granulocytes: 1 %
Lymphocytes Relative: 2 %
Lymphs Abs: 0.3 10*3/uL — ABNORMAL LOW (ref 0.7–4.0)
MCH: 24.5 pg — ABNORMAL LOW (ref 26.0–34.0)
MCHC: 29.6 g/dL — ABNORMAL LOW (ref 30.0–36.0)
MCV: 82.7 fL (ref 80.0–100.0)
Monocytes Absolute: 0.5 10*3/uL (ref 0.1–1.0)
Monocytes Relative: 3 %
Neutro Abs: 14.2 10*3/uL — ABNORMAL HIGH (ref 1.7–7.7)
Neutrophils Relative %: 94 %
Platelets: 555 10*3/uL — ABNORMAL HIGH (ref 150–400)
RBC: 4.57 MIL/uL (ref 3.87–5.11)
RDW: 16.6 % — ABNORMAL HIGH (ref 11.5–15.5)
WBC: 15.1 10*3/uL — ABNORMAL HIGH (ref 4.0–10.5)
nRBC: 0 % (ref 0.0–0.2)

## 2023-05-10 LAB — PREGNANCY, URINE: Preg Test, Ur: NEGATIVE

## 2023-05-10 LAB — LIPASE, BLOOD: Lipase: 26 U/L (ref 11–51)

## 2023-05-10 MED ORDER — PANTOPRAZOLE SODIUM 40 MG IV SOLR
40.0000 mg | Freq: Once | INTRAVENOUS | Status: AC
Start: 2023-05-10 — End: 2023-05-10
  Administered 2023-05-10: 40 mg via INTRAVENOUS
  Filled 2023-05-10: qty 10

## 2023-05-10 MED ORDER — IOHEXOL 300 MG/ML  SOLN
100.0000 mL | Freq: Once | INTRAMUSCULAR | Status: AC | PRN
  Administered 2023-05-10: 100 mL via INTRAVENOUS

## 2023-05-10 MED ORDER — CARVEDILOL 12.5 MG PO TABS
25.0000 mg | ORAL_TABLET | Freq: Two times a day (BID) | ORAL | Status: DC
  Administered 2023-05-10: 25 mg via ORAL
  Filled 2023-05-10: qty 2

## 2023-05-10 MED ORDER — ONDANSETRON HCL 4 MG/2ML IJ SOLN
4.0000 mg | Freq: Once | INTRAMUSCULAR | Status: AC
  Administered 2023-05-10: 4 mg via INTRAVENOUS
  Filled 2023-05-10: qty 2

## 2023-05-10 MED ORDER — LACTATED RINGERS IV BOLUS
1000.0000 mL | Freq: Once | INTRAVENOUS | Status: AC
  Administered 2023-05-10: 1000 mL via INTRAVENOUS

## 2023-05-10 MED ORDER — HYDROMORPHONE HCL 1 MG/ML IJ SOLN
1.0000 mg | Freq: Once | INTRAMUSCULAR | Status: AC
  Administered 2023-05-10: 1 mg via INTRAVENOUS
  Filled 2023-05-10: qty 1

## 2023-05-10 MED ORDER — OXYCODONE-ACETAMINOPHEN 5-325 MG PO TABS: 1.0000 | ORAL_TABLET | Freq: Three times a day (TID) | ORAL | 0 refills | Status: DC | PRN

## 2023-05-10 MED ORDER — ONDANSETRON HCL 4 MG PO TABS: 4.0000 mg | ORAL_TABLET | Freq: Three times a day (TID) | ORAL | 0 refills | Status: DC | PRN

## 2023-05-10 NOTE — ED Provider Notes (Signed)
[place] EMERGENCY DEPARTMENT AT [facility] Provider Note   CSN: [id number] Arrival date & time: 05/09/23  2329     History  Chief Complaint  Patient presents with   Emesis    Denise Walters is a 43 y.o. female.  43 year old female presents ER today with abdominal pain.  Patient states he has been throwing up and having diarrhea for a while now pain seems to be worse in right upper quadrant.  She states a physical she had her gallbladder removed.  Has been many years.  No known sick contacts.  She says some chills but no fevers.  Did not take her night medications because of her emesis.  She stone of at least 20 times since she got off work this afternoon.  Nonbloody nonbilious.  It is yellow-colored and tasting acid.  No rashes.  No other associated symptoms.   Emesis      Home Medications Prior to Admission medications   Medication Sig Start Date End Date Taking? Authorizing Provider  ondansetron (ZOFRAN) 4 MG tablet Take 1 tablet (4 mg total) by mouth every 8 (eight) hours as needed for nausea or vomiting. 05/10/23  Yes Ayman Brull, Barbara Cower, MD  oxyCODONE-acetaminophen (PERCOCET/ROXICET) 5-325 MG tablet Take 1 tablet by mouth every 8 (eight) hours as needed for severe pain (pain score 7-10). 05/10/23  Yes Amahri Dengel, Barbara Cower, MD  amLODipine (NORVASC) 10 MG tablet Take 10 mg by mouth daily.    [provider]  carvedilol (COREG) 25 MG tablet Take 25 mg by mouth 2 (two) times daily. 07/25/17   [provider]  diphenhydrAMINE (BENADRYL) 25 MG tablet Take 1 tablet (25 mg total) by mouth every 6 (six) hours. 04/14/23   Burgess Amor, PA-C  doxycycline (VIBRAMYCIN) 100 MG capsule Take 1 capsule (100 mg total) by mouth 2 (two) times daily. 12/12/18   Liberty Handy, PA-C  lisinopril (PRINIVIL,ZESTRIL) 20 MG tablet Take 1 tablet by mouth daily. 07/25/17   [provider]  lisinopril-hydrochlorothiazide (PRINZIDE,ZESTORETIC) 20-25 MG tablet Take 1 tablet by mouth  daily. Add to the lisinopril 20 mg    [provider]  loratadine (CLARITIN) 10 MG tablet Take 1 tablet (10 mg total) by mouth daily. 10/10/20 11/09/20  Couture, Cortni S, PA-C  pantoprazole (PROTONIX) 40 MG tablet Take 1 tablet (40 mg total) by mouth daily before breakfast. 02/11/20   Elson Areas, PA-C  polyethylene glycol (MIRALAX) 17 g packet Take 17 g by mouth daily. 03/06/23   Gareth Eagle, PA-C  predniSONE (DELTASONE) 10 MG tablet Take 6 tablets day one, 5 tablets day two, 4 tablets day three, 3 tablets day four, 2 tablets day five, then 1 tablet day six 04/14/23   Burgess Amor, PA-C      Allergies    Chocolate    Review of Systems   Review of Systems  Gastrointestinal:  Positive for vomiting.    Physical Exam Updated Vital Signs BP (!) 155/105   Pulse (!) 112   Temp 99.9 F (37.7 C) (Oral)   Resp 15   Ht 5\' 2"  (1.575 m)   Wt 126.1 kg   SpO2 98%   BMI 50.85 kg/m  Physical Exam Vitals and nursing note reviewed.  Constitutional:      Appearance: She is well-developed.  HENT:     Head: Normocephalic and atraumatic.     Nose: No congestion or rhinorrhea.  Cardiovascular:     Rate and Rhythm: Normal rate and regular rhythm.  Pulmonary:     Effort: No respiratory distress.     Breath sounds: No stridor.  Abdominal:     General: There is no distension.     Tenderness: There is no abdominal tenderness.  Musculoskeletal:     Cervical back: Normal range of motion.  Skin:    General: Skin is warm and dry.  Neurological:     General: No focal deficit present.     Mental Status: She is alert.     ED Results / Procedures / Treatments   Labs (all labs ordered are listed, but only abnormal results are displayed) Labs Reviewed  CBC WITH DIFFERENTIAL/PLATELET - Abnormal; Notable for the following components:      Result Value   WBC 15.1 (*)    Hemoglobin 11.2 (*)    MCH 24.5 (*)    MCHC 29.6 (*)    RDW 16.6 (*)    Platelets 555 (*)    Neutro Abs 14.2  (*)    Lymphs Abs 0.3 (*)    Abs Immature Granulocytes 0.08 (*)    All other components within normal limits  COMPREHENSIVE METABOLIC PANEL - Abnormal; Notable for the following components:   Potassium 3.1 (*)    Glucose, Bld 140 (*)    Calcium 8.6 (*)    Total Protein 8.5 (*)    All other components within normal limits  URINALYSIS, ROUTINE W REFLEX MICROSCOPIC - Abnormal; Notable for the following components:   APPearance HAZY (*)    Hgb urine dipstick SMALL (*)    Protein, ur 30 (*)    Bacteria, UA RARE (*)    All other components within normal limits  LIPASE, BLOOD  PREGNANCY, URINE    EKG None  Radiology CT ABDOMEN PELVIS W CONTRAST Result Date: 05/10/2023 CLINICAL DATA:  Abdominal pain, vomiting, diarrhea EXAM: CT ABDOMEN AND PELVIS WITH CONTRAST TECHNIQUE: Multidetector CT imaging of the abdomen and pelvis was performed using the standard protocol following bolus administration of intravenous contrast. RADIATION DOSE REDUCTION: This exam was performed according to the departmental dose-optimization program which includes automated exposure control, adjustment of the mA and/or kV according to patient size and/or use of iterative reconstruction technique. CONTRAST:  OMNIPAQUE IOHEXOL 300 MG/ML  SOLN COMPARISON:  CT pelvis dated 03/20/2020. CT abdomen/pelvis dated 07/30/2017. FINDINGS: Lower chest: Lung bases are clear. Hepatobiliary: Liver is within normal limits. Status post cholecystectomy. No intrahepatic or extrahepatic ductal dilatation. Pancreas: Within normal limits. Spleen: Within normal limits. Adrenals/Urinary Tract: Adrenal glands are within normal limits. Left renal cysts, measuring up to 2.3 cm in the anterior left lower kidney (series 2/image 27), measuring simple fluid density, benign (Bosniak I). No follow-up is recommended. 4 mm nonobstructing left lower pole renal calculus (series 2/image 29). Right kidney is within normal limits.  No hydronephrosis. Bladder is  within normal limits. Stomach/Bowel: Stomach is notable for a tiny hiatal hernia. No evidence of bowel obstruction. Appendix is not discretely visualized. No colonic wall thickening or inflammatory changes. Vascular/Lymphatic: No evidence of abdominal aortic aneurysm. No suspicious abdominopelvic lymphadenopathy. Reproductive: Heterogeneous uterus with suspected uterine fibroids. Bilateral ovaries are within normal limits. Other: No abdominopelvic ascites. 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 IMPRESSION: No CT findings to account for the patient's abdominal pain. 4 mm nonobstructing left lower pole renal calculus. No hydronephrosis. Uterine fibroids. Electronically Signed   By: Charline Bills M.D.   On: 05/10/2023 03:01    Procedures Procedures    Medications Ordered in ED Medications  carvedilol (COREG) tablet  25 mg (25 mg Oral Given 05/10/23 0512)  ondansetron (ZOFRAN) injection 4 mg (4 mg Intravenous Given 05/10/23 0040)  lactated ringers bolus 1,000 mL (0 mLs Intravenous Stopped 05/10/23 0147)  lactated ringers bolus 1,000 mL (0 mLs Intravenous Stopped 05/10/23 0354)  pantoprazole (PROTONIX) injection 40 mg (40 mg Intravenous Given 05/10/23 0230)  iohexol (OMNIPAQUE) 300 MG/ML solution 100 mL (100 mLs Intravenous Contrast Given 05/10/23 0236)  HYDROmorphone (DILAUDID) injection 1 mg (1 mg Intravenous Given 05/10/23 0411)    ED Course/ Medical Decision Making/ A&P                                 Medical Decision Making Amount and/or Complexity of Data Reviewed Labs: ordered. Radiology: ordered. ECG/medicine tests: ordered.  Risk Prescription drug management.   Patient tachycardic with abdominal pain and emesis suspect dehydration so fluids were administered.  Vomiting up acid at this point secondary to multiple episodes of emesis so Protonix and nausea meds and fluids.  Initially had no plan to do CT scan however she did not have a leukocytosis this accommodation with her  abdominal pain and tachycardia CT done to evaluate for any evidence of bowel obstruction, enteritis, colitis, abscess or other concerning features.  This was reassuring.  Pain meds provided and will give her night dose of Coreg to see if this helps with the tachycardia as it could be just a withdrawal from not having her medication tonight.  Will reevaluate for improvement.  On reevaluation HR slowly improving, 105 at this time, suspect this was rebound tachy from missing meds. Feels better. A couple liters of fluids given and making urine, doubt dehydration. Abdomen improved. Will d/c w/ pcp fu if not improving, here if it worsens.   Final Clinical Impression(s) / ED Diagnoses Final diagnoses:  Nausea and vomiting, unspecified vomiting type  Right upper quadrant abdominal pain    Rx / DC Orders ED Discharge Orders          Ordered    oxyCODONE-acetaminophen (PERCOCET/ROXICET) 5-325 MG tablet  Every 8 hours PRN        05/10/23 0613    ondansetron (ZOFRAN) 4 MG tablet  Every 8 hours PRN        05/10/23 0613              Ndia Sampath, Barbara Cower, MD 05/10/23 [phone]

## 2023-05-10 NOTE — ED Notes (Signed)
Patient transported to CT 

## 2023-05-10 NOTE — ED Notes (Signed)
Pt given water and asked for a urine sample. Pt said after she drinks the water shell be able to pee

## 2023-05-11 MED FILL — Ondansetron HCl Tab 4 MG: ORAL | Qty: 4 | Status: AC

## 2023-05-11 MED FILL — Oxycodone w/ Acetaminophen Tab 5-325 MG: ORAL | Qty: 6 | Status: AC

## 2023-08-18 ENCOUNTER — Other Ambulatory Visit: Payer: Self-pay

## 2023-08-18 ENCOUNTER — Emergency Department (HOSPITAL_COMMUNITY)

## 2023-08-18 ENCOUNTER — Emergency Department (HOSPITAL_COMMUNITY)
Admission: EM | Admit: 2023-08-18 | Discharge: 2023-08-18 | Disposition: A | Discharge status: Home / Self Care | Attending: Emergency Medicine | Admitting: Emergency Medicine

## 2023-08-18 DIAGNOSIS — K6289 Other specified diseases of anus and rectum: Secondary | ICD-10-CM | POA: Diagnosis present

## 2023-08-18 DIAGNOSIS — Z79899 Other long term (current) drug therapy: Secondary | ICD-10-CM | POA: Diagnosis not present

## 2023-08-18 DIAGNOSIS — I10 Essential (primary) hypertension: Secondary | ICD-10-CM | POA: Diagnosis not present

## 2023-08-18 DIAGNOSIS — K629 Disease of anus and rectum, unspecified: Secondary | ICD-10-CM | POA: Diagnosis not present

## 2023-08-18 LAB — CBC WITH DIFFERENTIAL/PLATELET
Abs Immature Granulocytes: 0.03 10*3/uL (ref 0.00–0.07)
Basophils Absolute: 0 10*3/uL (ref 0.0–0.1)
Basophils Relative: 1 %
Eosinophils Absolute: 0.1 10*3/uL (ref 0.0–0.5)
Eosinophils Relative: 2 %
HCT: 32.9 % — ABNORMAL LOW (ref 36.0–46.0)
Hemoglobin: 9.5 g/dL — ABNORMAL LOW (ref 12.0–15.0)
Immature Granulocytes: 0 %
Lymphocytes Relative: 29 %
Lymphs Abs: 2.1 10*3/uL (ref 0.7–4.0)
MCH: 23.1 pg — ABNORMAL LOW (ref 26.0–34.0)
MCHC: 28.9 g/dL — ABNORMAL LOW (ref 30.0–36.0)
MCV: 80 fL (ref 80.0–100.0)
Monocytes Absolute: 0.5 10*3/uL (ref 0.1–1.0)
Monocytes Relative: 7 %
Neutro Abs: 4.5 10*3/uL (ref 1.7–7.7)
Neutrophils Relative %: 61 %
Platelets: 487 10*3/uL — ABNORMAL HIGH (ref 150–400)
RBC: 4.11 MIL/uL (ref 3.87–5.11)
RDW: 17.2 % — ABNORMAL HIGH (ref 11.5–15.5)
WBC: 7.3 10*3/uL (ref 4.0–10.5)
nRBC: 0 % (ref 0.0–0.2)

## 2023-08-18 LAB — BASIC METABOLIC PANEL WITH GFR
Anion gap: 8 (ref 5–15)
BUN: 13 mg/dL (ref 6–20)
CO2: 20 mmol/L — ABNORMAL LOW (ref 22–32)
Calcium: 8.9 mg/dL (ref 8.9–10.3)
Chloride: 105 mmol/L (ref 98–111)
Creatinine, Ser: 0.81 mg/dL (ref 0.44–1.00)
GFR, Estimated: >60 mL/min (ref >60)
Glucose, Bld: 85 mg/dL (ref 70–99)
Potassium: 3.6 mmol/L (ref 3.5–5.1)
Sodium: 133 mmol/L — ABNORMAL LOW (ref 135–145)

## 2023-08-18 LAB — HCG, QUANTITATIVE, PREGNANCY: hCG, Beta Chain, Quant, S: 1 m[IU]/mL (ref <5)

## 2023-08-18 MED ORDER — IOHEXOL 300 MG/ML  SOLN
75.0000 mL | Freq: Once | INTRAMUSCULAR | Status: AC | PRN
  Administered 2023-08-18: 75 mL via INTRAVENOUS

## 2023-08-18 MED ORDER — AMOXICILLIN-POT CLAVULANATE 875-125 MG PO TABS
1.0000 | ORAL_TABLET | Freq: Once | ORAL | Status: AC
  Administered 2023-08-18: 1 via ORAL
  Filled 2023-08-18: qty 1

## 2023-08-18 MED ORDER — AMOXICILLIN-POT CLAVULANATE 875-125 MG PO TABS: 1.0000 | ORAL_TABLET | Freq: Two times a day (BID) | ORAL | 0 refills | Status: DC

## 2023-08-18 NOTE — ED Provider Notes (Signed)
 [place] EMERGENCY DEPARTMENT AT [facility] Provider Note   CSN: [id number] Arrival date & time: 08/18/23  1155     History  Chief Complaint  Patient presents with   Hemorrhoids    Denise Walters is a 43 y.o. female, history of hypertension, who presents to the ED secondary to anal pain has been going on for last 2 days.  She states is exquisitely painful, when she sits and she denies any fevers or chills.  States that she does struggle with constipation, and often eats cheese and milk, which causes constipation.  States she does strain at times.  Notes that she does get cysts, but no history of perianal abscesses.    Home Medications Prior to Admission medications   Medication Sig Start Date End Date Taking? Authorizing Provider  amoxicillin -clavulanate (AUGMENTIN ) 875-125 MG tablet Take 1 tablet by mouth every 12 (twelve) hours. 08/18/23  Yes Cherrise Occhipinti L, PA  amLODipine  (NORVASC ) 10 MG tablet Take 10 mg by mouth daily.    [provider]  carvedilol  (COREG ) 25 MG tablet Take 25 mg by mouth 2 (two) times daily. 07/25/17   [provider]  diphenhydrAMINE  (BENADRYL ) 25 MG tablet Take 1 tablet (25 mg total) by mouth every 6 (six) hours. 04/14/23   Idol, Julie, PA-C  doxycycline  (VIBRAMYCIN ) 100 MG capsule Take 1 capsule (100 mg total) by mouth 2 (two) times daily. 12/12/18   Theodora Fish, PA-C  lisinopril  (PRINIVIL ,ZESTRIL ) 20 MG tablet Take 1 tablet by mouth daily. 07/25/17   [provider]  lisinopril -hydrochlorothiazide  (PRINZIDE ,ZESTORETIC ) 20-25 MG tablet Take 1 tablet by mouth daily. Add to the lisinopril  20 mg    [provider]  loratadine  (CLARITIN ) 10 MG tablet Take 1 tablet (10 mg total) by mouth daily. 10/10/20 11/09/20  Couture, Cortni S, PA-C  ondansetron  (ZOFRAN ) 4 MG tablet Take 1 tablet (4 mg total) by mouth every 8 (eight) hours as needed for nausea or vomiting. 05/10/23   Mesner, Reymundo Caulk, MD  oxyCODONE -acetaminophen   (PERCOCET/ROXICET) 5-325 MG tablet Take 1 tablet by mouth every 8 (eight) hours as needed for severe pain (pain score 7-10). 05/10/23   Mesner, Reymundo Caulk, MD  pantoprazole  (PROTONIX ) 40 MG tablet Take 1 tablet (40 mg total) by mouth daily before breakfast. 02/11/20   Sofia, Leslie K, PA-C  polyethylene glycol (MIRALAX ) 17 g packet Take 17 g by mouth daily. 03/06/23   Robinson, John K, PA-C  predniSONE  (DELTASONE ) 10 MG tablet Take 6 tablets day one, 5 tablets day two, 4 tablets day three, 3 tablets day four, 2 tablets day five, then 1 tablet day six 04/14/23   Idol, Julie, PA-C      Allergies    Chocolate    Review of Systems   Review of Systems  Constitutional:  Negative for fever.    Physical Exam Updated Vital Signs BP 117/85 (BP Location: Right Arm)   Pulse 77   Temp 98.2 F (36.8 C) (Oral)   Resp 16   Ht 5\' 2"  (1.575 m)   Wt 126.1 kg   LMP 08/05/2023 (Approximate)   SpO2 95%   BMI 50.85 kg/m  Physical Exam Vitals and nursing note reviewed.  Constitutional:      General: She is not in acute distress.    Appearance: She is well-developed.  HENT:     Head: Normocephalic and atraumatic.  Eyes:     Conjunctiva/sclera: Conjunctivae normal.  Cardiovascular:     Rate and Rhythm: Normal rate and regular  rhythm.     Heart sounds: No murmur heard. Pulmonary:     Effort: Pulmonary effort is normal. No respiratory distress.     Breath sounds: Normal breath sounds.  Abdominal:     Palpations: Abdomen is soft.     Tenderness: There is no abdominal tenderness.  Genitourinary:    Comments: 0.5x0.5 cm perianal mass, leaking fluid yellowish fluid, at 12:00, on anus.  Did not tolerate, internal exam.  Nursing staff, present for chaperone.    1x1 cm cyst along gluteal cleft Musculoskeletal:        General: No swelling.     Cervical back: Neck supple.  Skin:    General: Skin is warm and dry.     Capillary Refill: Capillary refill takes less than 2 seconds.  Neurological:     Mental  Status: She is alert.  Psychiatric:        Mood and Affect: Mood normal.     ED Results / Procedures / Treatments   Labs (all labs ordered are listed, but only abnormal results are displayed) Labs Reviewed  CBC WITH DIFFERENTIAL/PLATELET - Abnormal; Notable for the following components:      Result Value   Hemoglobin 9.5 (*)    HCT 32.9 (*)    MCH 23.1 (*)    MCHC 28.9 (*)    RDW 17.2 (*)    Platelets 487 (*)    All other components within normal limits  BASIC METABOLIC PANEL WITH GFR - Abnormal; Notable for the following components:   Sodium 133 (*)    CO2 20 (*)    All other components within normal limits  HCG, QUANTITATIVE, PREGNANCY    EKG None  Radiology CT PELVIS W CONTRAST Result Date: 08/18/2023 CLINICAL DATA:  Hemorrhoids for 2 days, pain EXAM: CT PELVIS WITH CONTRAST TECHNIQUE: Multidetector CT imaging of the pelvis was performed using the standard protocol following the bolus administration of intravenous contrast. RADIATION DOSE REDUCTION: This exam was performed according to the departmental dose-optimization program which includes automated exposure control, adjustment of the mA and/or kV according to patient size and/or use of iterative reconstruction technique. CONTRAST:  75mL OMNIPAQUE  IOHEXOL  300 MG/ML  SOLN COMPARISON:  05/10/2023 FINDINGS: Urinary Tract: Distal ureters and bladder are unremarkable. No urinary tract calculi. Bowel: No bowel obstruction or ileus. No bowel wall thickening or inflammatory change. No evidence of perianal abscess or fistula. Vascular/Lymphatic: No pathologically enlarged lymph nodes. No significant vascular abnormality seen. No evidence of hemorrhoids. Reproductive:  Stable fibroid uterus.  No adnexal masses. Other: Trace pelvic free fluid likely physiologic. No free intraperitoneal gas. No abdominal wall hernia. Musculoskeletal: No acute or destructive bony abnormalities. Reconstructed images demonstrate no additional findings.  IMPRESSION: 1. No acute intrapelvic process. Specifically, no evidence of hemorrhoids or perianal abscess/fistula. 2. Stable fibroid uterus. 3. Trace pelvic free fluid, likely physiologic. Electronically Signed   By: Bobbye Burrow M.D.   On: 08/18/2023 16:59    Procedures Procedures    Medications Ordered in ED Medications  amoxicillin -clavulanate (AUGMENTIN ) 875-125 MG per tablet 1 tablet (1 tablet Oral Given 08/18/23 1325)  iohexol  (OMNIPAQUE ) 300 MG/ML solution 75 mL (75 mLs Intravenous Contrast Given 08/18/23 1504)    ED Course/ Medical Decision Making/ A&P                                 Medical Decision Making Patient is a 43 year old female, here for anal pain, it  has been going on for the like the last 2 days.  She states is a very painful to sit on.  She has a perianal mass, leaking yellowish fluid, around 12:00 on the anus.  It is tender to the touch, no evidence of any kind of thrombus noted.  Given her obesity, difficulty determining how deep the masses, will obtain a CT pelvis, and blood work for further evaluation.  Will also give Augmentin  given the leaking yellow fluid.  Amount and/or Complexity of Data Reviewed Labs: ordered.    Details: Labs unremarkable Radiology: ordered.    Details: CT pelvis shows no perianal or perirectal abscess Discussion of management or test interpretation with external provider(s): Per patient's request, utilized a needle, to help lance the area, and provide more drainage, some yellowish clear fluid leaked out, as well as blood.  Had a little bit of relief.  Will start on antibiotics including Augmentin  and have bit follow-up with general surgery.  Tolerated well.  Discussed return precautions and she was discharged home.  Discussed importance of follow-up, and making sure that she has enough fiber in her diet, to help with constipation, and to use sitz bath's, to help with the pain and swelling  Risk Prescription drug management.    Final  Clinical Impression(s) / ED Diagnoses Final diagnoses:  Perianal lesion    Rx / DC Orders ED Discharge Orders          Ordered    amoxicillin -clavulanate (AUGMENTIN ) 875-125 MG tablet  Every 12 hours        08/18/23 1721              Gaby Harney, [place], PA 08/18/23 1737    Deatra Face, MD 08/19/23 [phone]

## 2023-08-18 NOTE — ED Triage Notes (Signed)
 Pt c/o hemorrhoids x 2 days. Pt c/o pain when sitting.

## 2023-08-18 NOTE — ED Notes (Signed)
 Pt ambulated to ED room. Stated, "I know the noticed the bump about 3 days ago. At first it didn't bother me but now it hurts to sit and wipe."   Denies drainage at this time. Bump is intact.

## 2023-08-18 NOTE — Discharge Instructions (Addendum)
 Please follow-up with a general surgeon as instructed, keep the area clean and dry and take the antibiotics as prescribed.  Return to the ER if you feel like your symptoms are worsening.

## 2023-09-06 ENCOUNTER — Encounter: Payer: Self-pay | Admitting: General Surgery

## 2023-09-06 ENCOUNTER — Ambulatory Visit: Admitting: General Surgery

## 2023-09-06 VITALS — BP 160/110 | HR 90 | Temp 98.1°F | Resp 14 | Ht 62.0 in | Wt 261.0 lb

## 2023-09-06 DIAGNOSIS — K644 Residual hemorrhoidal skin tags: Secondary | ICD-10-CM | POA: Diagnosis not present

## 2023-09-06 NOTE — Progress Notes (Signed)
 Denise Walters; [id number]; 05/14/1980   HPI Patient is a 43 year old black female who was referred to my care by the emergency room for evaluation and treatment of a perianal abscess.  Patient was seen in the emergency room on 08/18/2023 and was found to have a perianal lesion.  Initially, it was felt to be possibly a perianal abscess.  Needle aspiration was negative.  She was placed on antibiotics and discharged home.  She has finished her antibiotics but still has some rectal pain.  She feels a lump around the anus which is tender to touch.  She denies any blood per rectum.  She denies any purulent drainage. Past Medical History:  Diagnosis Date   Abscess    Chronic headache    Elevated lipids    History of seizure 2010   Hypertension    Seizures (HCC)    Tonsillar and adenoid hypertrophy     Past Surgical History:  Procedure Laterality Date   BREAST REDUCTION SURGERY     CESAREAN SECTION  10/05/2003   CHOLECYSTECTOMY  12/01/2003   COLONOSCOPY N/A 06/28/2016   Dr. Nolene Baumgarten: diverticulosis, hemorrhoids. next tcs in 06/2021 due to [facility].   TONSILLECTOMY     TONSILLECTOMY AND ADENOIDECTOMY Bilateral 05/05/2014   Procedure: BILATERAL TONSILLECTOMY  W/  ABLATATION OF ADENOIDS;  Surgeon: Lawence Press, MD;  Location: [facility];  Service: ENT;  Laterality: Bilateral;   TUBAL LIGATION  01/08/2004    Family History  Problem Relation Age of Onset   Colon cancer Mother    Heart attack Father    Hypertension Brother     Current Outpatient Medications on File Prior to Visit  Medication Sig Dispense Refill   amLODipine  (NORVASC ) 10 MG tablet Take 10 mg by mouth daily.     carvedilol  (COREG ) 25 MG tablet Take 25 mg by mouth 2 (two) times daily.  3   Cholecalciferol (VITAMIN D3) 1.25 MG (50000 UT) CAPS Take 1 capsule by mouth once a week.     ferrous sulfate 324 MG TBEC Take 324 mg by mouth.     lisinopril  (PRINIVIL ,ZESTRIL ) 20 MG tablet Take 1 tablet by mouth daily.  3    lisinopril -hydrochlorothiazide  (PRINZIDE ,ZESTORETIC ) 20-25 MG tablet Take 1 tablet by mouth daily. Add to the lisinopril  20 mg     No current facility-administered medications on file prior to visit.    Allergies  Allergen Reactions   Chocolate Hives    Social History   Substance and Sexual Activity  Alcohol Use No    Social History   Tobacco Use  Smoking Status Never  Smokeless Tobacco Never    Review of Systems  Constitutional: Negative.   HENT: Negative.    Eyes: Negative.   Respiratory: Negative.    Cardiovascular: Negative.   Gastrointestinal: Negative.   Genitourinary: Negative.   Musculoskeletal: Negative.   Skin: Negative.   Neurological: Negative.   Endo/Heme/Allergies: Negative.   Psychiatric/Behavioral: Negative.      Objective   Vitals:   09/06/23 0915  BP: (!) 160/110  Pulse: 90  Resp: 14  Temp: 98.1 F (36.7 C)  SpO2: 95%    Physical Exam Vitals reviewed.  Constitutional:      Appearance: Normal appearance. She is obese. She is not ill-appearing.  HENT:     Head: Normocephalic and atraumatic.  Cardiovascular:     Rate and Rhythm: Normal rate and regular rhythm.     Heart sounds: Normal heart sounds. No murmur heard.  No friction rub. No gallop.  Pulmonary:     Effort: Pulmonary effort is normal. No respiratory distress.     Breath sounds: Normal breath sounds. No stridor. No wheezing, rhonchi or rales.  Genitourinary:    Comments: A small external irritated hemorrhoid is present at the 6 o'clock position.  No active bleeding is noted.  No perianal induration or drainage is noted.  Examination was limited secondary to discomfort.  Patient has a pedunculated lipomatous lesion along the upper portion of the buttock crease. Skin:    General: Skin is warm and dry.  Neurological:     Mental Status: She is alert and oriented to person, place, and time.   ER notes reviewed  Assessment  External hemorrhoid Lipoma, buttock crease.   Patient does not want this addressed at the present time. Plan  Patient is scheduled for hemorrhoidectomy on 10/12/2023.  The risks and benefits of the procedure including bleeding, infection, and recurrence of the hemorrhoid were fully explained to the patient, who gave informed consent.

## 2023-09-17 NOTE — H&P (Signed)
 Denise Walters; [id number]; 05/14/1980   HPI Patient is a 43 year old black female who was referred to my care by the emergency room for evaluation and treatment of a perianal abscess.  Patient was seen in the emergency room on 08/18/2023 and was found to have a perianal lesion.  Initially, it was felt to be possibly a perianal abscess.  Needle aspiration was negative.  She was placed on antibiotics and discharged home.  She has finished her antibiotics but still has some rectal pain.  She feels a lump around the anus which is tender to touch.  She denies any blood per rectum.  She denies any purulent drainage. Past Medical History:  Diagnosis Date   Abscess    Chronic headache    Elevated lipids    History of seizure 2010   Hypertension    Seizures (HCC)    Tonsillar and adenoid hypertrophy     Past Surgical History:  Procedure Laterality Date   BREAST REDUCTION SURGERY     CESAREAN SECTION  10/05/2003   CHOLECYSTECTOMY  12/01/2003   COLONOSCOPY N/A 06/28/2016   Dr. Nolene Baumgarten: diverticulosis, hemorrhoids. next tcs in 06/2021 due to [facility].   TONSILLECTOMY     TONSILLECTOMY AND ADENOIDECTOMY Bilateral 05/05/2014   Procedure: BILATERAL TONSILLECTOMY  W/  ABLATATION OF ADENOIDS;  Surgeon: Lawence Press, MD;  Location: [facility];  Service: ENT;  Laterality: Bilateral;   TUBAL LIGATION  01/08/2004    Family History  Problem Relation Age of Onset   Colon cancer Mother    Heart attack Father    Hypertension Brother     Current Outpatient Medications on File Prior to Visit  Medication Sig Dispense Refill   amLODipine  (NORVASC ) 10 MG tablet Take 10 mg by mouth daily.     carvedilol  (COREG ) 25 MG tablet Take 25 mg by mouth 2 (two) times daily.  3   Cholecalciferol (VITAMIN D3) 1.25 MG (50000 UT) CAPS Take 1 capsule by mouth once a week.     ferrous sulfate 324 MG TBEC Take 324 mg by mouth.     lisinopril  (PRINIVIL ,ZESTRIL ) 20 MG tablet Take 1 tablet by mouth daily.  3    lisinopril -hydrochlorothiazide  (PRINZIDE ,ZESTORETIC ) 20-25 MG tablet Take 1 tablet by mouth daily. Add to the lisinopril  20 mg     No current facility-administered medications on file prior to visit.    Allergies  Allergen Reactions   Chocolate Hives    Social History   Substance and Sexual Activity  Alcohol Use No    Social History   Tobacco Use  Smoking Status Never  Smokeless Tobacco Never    Review of Systems  Constitutional: Negative.   HENT: Negative.    Eyes: Negative.   Respiratory: Negative.    Cardiovascular: Negative.   Gastrointestinal: Negative.   Genitourinary: Negative.   Musculoskeletal: Negative.   Skin: Negative.   Neurological: Negative.   Endo/Heme/Allergies: Negative.   Psychiatric/Behavioral: Negative.      Objective   Vitals:   09/06/23 0915  BP: (!) 160/110  Pulse: 90  Resp: 14  Temp: 98.1 F (36.7 C)  SpO2: 95%    Physical Exam Vitals reviewed.  Constitutional:      Appearance: Normal appearance. She is obese. She is not ill-appearing.  HENT:     Head: Normocephalic and atraumatic.  Cardiovascular:     Rate and Rhythm: Normal rate and regular rhythm.     Heart sounds: Normal heart sounds. No murmur heard.  No friction rub. No gallop.  Pulmonary:     Effort: Pulmonary effort is normal. No respiratory distress.     Breath sounds: Normal breath sounds. No stridor. No wheezing, rhonchi or rales.  Genitourinary:    Comments: A small external irritated hemorrhoid is present at the 6 o'clock position.  No active bleeding is noted.  No perianal induration or drainage is noted.  Examination was limited secondary to discomfort.  Patient has a pedunculated lipomatous lesion along the upper portion of the buttock crease. Skin:    General: Skin is warm and dry.  Neurological:     Mental Status: She is alert and oriented to person, place, and time.   ER notes reviewed  Assessment  External hemorrhoid Lipoma, buttock crease.   Patient does not want this addressed at the present time. Plan  Patient is scheduled for hemorrhoidectomy on 10/12/2023.  The risks and benefits of the procedure including bleeding, infection, and recurrence of the hemorrhoid were fully explained to the patient, who gave informed consent.

## 2023-10-08 NOTE — Patient Instructions (Signed)
 Denise Walters  10/08/2023     @PREFPERIOPPHARMACY @   Your procedure is scheduled on  10/12/2023.   Report to [facility] at  0600 A.M.   Call this number if you have problems the morning of surgery:  [phone]  If you experience any cold or flu symptoms such as cough, fever, chills, shortness of breath, etc. between now and your scheduled surgery, please notify us  at the above number.   Remember:  Do not eat after midnight.   You may drink clear liquids until  0330 am on 10/12/2023.    Clear liquids allowed are:                    Water, Juice (No red color; non-citric and without pulp; diabetics please choose diet or no sugar options), Carbonated beverages (diabetics please choose diet or no sugar options), Clear Tea (No creamer, milk, or cream, including half & half and powdered creamer), Black Coffee Only (No creamer, milk or cream, including half & half and powdered creamer), and Clear Sports drink (No red color; diabetics please choose diet or no sugar options)    Take these medicines the morning of surgery with A SIP OF WATER                                     Amlodipine , carvedilol .    Do not wear jewelry, make-up or nail polish, including gel polish,  artificial nails, or any other type of covering on natural nails (fingers and  toes).  Do not wear lotions, powders, or perfumes, or deodorant.  Do not shave 48 hours prior to surgery.  Men may shave face and neck.  Do not bring valuables to the hospital.  [facility] is not responsible for any belongings or valuables.  Contacts, dentures or bridgework may not be worn into surgery.  Leave your suitcase in the car.  After surgery it may be brought to your room.  For patients admitted to the hospital, discharge time will be determined by your treatment team.  Patients discharged the day of surgery will not be allowed to drive home and must have someone with them for 24 hours.    Special instructions:   DO  NOT smoke tobacco or vape for 24 hours before your procedure.  Please read over the following fact sheets that you were given. Coughing and Deep Breathing, Surgical Site Infection Prevention, Anesthesia Post-op Instructions, and Care and Recovery After Surgery      Surgical Procedures for Hemorrhoids Surgery can be used to treat hemorrhoids. Hemorrhoids are swollen veins in and around the rectum or the opening of the butt (anus). There are two types: Internal. These occur in the veins just inside the rectum. They may poke through to the outside. External. These occur in the veins outside the anus. They can be felt as a swelling or hard lump near the anus. Hemorrhoids can cause bleeding and pain. They can often be treated at home. If home treatments do not help, you may need to have surgery. Types of surgery include: Closed hemorrhoidectomy. Open hemorrhoidectomy. Stapled hemorrhoidectomy. Tell a health care provider about: Any allergies you have. All medicines you are taking, including vitamins, herbs, eye drops, creams, and over-the-counter medicines. Any problems you or family members have had with anesthesia. Any bleeding problems you have. Any surgeries you have had.  Any medical conditions you have. Whether you are pregnant or may be pregnant. What are the risks? Your health care provider will talk with you about risks. These may include: Pain and bleeding. You may also have a hematoma. This is a collection of blood in the area where surgery was done. Constipation. You may also have fecal impaction. This is when poop gets trapped in the anal canal. Trouble peeing (urinating) or the inability to control when you pee or poop (incontinence). Stenosis. This is narrowing of the anal canal. Getting hemorrhoids again. A new passage (fistula) forming between the anus or rectum and somewhere else in the body. Rectal prolapse. This happens when the lining of the rectum slips out of the  anus. What happens before the surgery? When to stop eating and drinking Follow instructions from your provider about what you may eat and drink. These may include: 8 hours before your surgery Stop eating most foods. Do not eat meat, fried foods, or fatty foods. Eat only light foods, such as toast or crackers. All liquids are okay except energy drinks and alcohol. 6 hours before your surgery Stop eating. Drink only clear liquids, such as water, clear fruit juice, black coffee, plain tea, and sports drinks. Do not drink energy drinks or alcohol. 2 hours before your surgery Stop drinking all liquids. You may be allowed to take medicines with small sips of water. If you do not follow your provider's instructions, your surgery may be delayed or canceled. Medicines Ask your provider about: Changing or stopping your regular medicines. These include any diabetes medicines or blood thinners you take. Taking medicines such as aspirin and ibuprofen . These medicines can thin your blood. Do not take them unless your provider tells you to. Taking over-the-counter medicines, vitamins, herbs, and supplements. Surgery safety Ask your provider: What steps will be taken to help prevent infection. These steps may include: Washing skin with a soap that kills germs. Taking antibiotics. General instructions You may be told to take a medicine that helps you poop (laxative) and an enema. This is done to clean out your colon before surgery (bowel prep). If you will be going home right after the procedure, plan to have a responsible adult: Take you home from the hospital or clinic. You will not be allowed to drive. Care for you for the time you are told. What happens during the surgery? An IV will be inserted into one of your veins. You will be given: A sedative. This helps you relax. Anesthesia. This keeps you from feeling pain. It will make you fall asleep for surgery. A jelly may be put into your  rectum. A short tube (anoscope) will be put into your rectum to look at the hemorrhoids. If you have an internal hemorrhoid, you may need a closed hemorrhoidectomy. For this surgery: Tools will be used to open the tissue around the hemorrhoids. The blood supply to the hemorrhoids will be tied off. The hemorrhoids will be taken out. The tissue around the affected area will be closed with stitches (sutures) that your body can absorb. If you have an open hemorrhoidectomy: Tools will be used to remove the hemorrhoids. The incisions will be left open to heal without sutures. If you have hemorrhoids that stick out of your anus, you may need a stapled hemorrhoidectomy. For this surgery: A stapling device will be used to partly remove the hemorrhoids. The device will be put into your anus. It will take out a ring of tissue. This tissue will  come from the hemorrhoids and from right above the hemorrhoids. The staples in the device will close the edges of the tissue. This will cut off the blood supply to any bits of the hemorrhoids that are left. It will also pull the tissue back into place. The surgeries may vary among providers and hospitals. What happens after the surgery? Your blood pressure, heart rate, breathing rate, and blood oxygen level may be monitored until you leave the hospital or clinic. You will be given pain medicine as needed. This information is not intended to replace advice given to you by your health care provider. Make sure you discuss any questions you have with your health care provider. Document Revised: 12/02/2021 Document Reviewed: 12/02/2021 Elsevier Patient Education  2024 [employer].General Anesthesia, Adult, Care After The following information offers guidance on how to care for yourself after your procedure. Your health care provider may also give you more specific instructions. If you have problems or questions, contact your health care provider. What can I expect  after the procedure? After the procedure, it is common for people to: Have pain or discomfort at the IV site. Have nausea or vomiting. Have a sore throat or hoarseness. Have trouble concentrating. Feel cold or chills. Feel weak, sleepy, or tired (fatigue). Have soreness and body aches. These can affect parts of the body that were not involved in surgery. Follow these instructions at home: For the time period you were told by your health care provider:  Rest. Do not participate in activities where you could fall or become injured. Do not drive or use machinery. Do not drink alcohol. Do not take sleeping pills or medicines that cause drowsiness. Do not make important decisions or sign legal documents. Do not take care of children on your own. General instructions Drink enough fluid to keep your urine pale yellow. If you have sleep apnea, surgery and certain medicines can increase your risk for breathing problems. Follow instructions from your health care provider about wearing your sleep device: Anytime you are sleeping, including during daytime naps. While taking prescription pain medicines, sleeping medicines, or medicines that make you drowsy. Return to your normal activities as told by your health care provider. Ask your health care provider what activities are safe for you. Take over-the-counter and prescription medicines only as told by your health care provider. Do not use any products that contain nicotine or tobacco. These products include cigarettes, chewing tobacco, and vaping devices, such as e-cigarettes. These can delay incision healing after surgery. If you need help quitting, ask your health care provider. Contact a health care provider if: You have nausea or vomiting that does not get better with medicine. You vomit every time you eat or drink. You have pain that does not get better with medicine. You cannot urinate or have bloody urine. You develop a skin rash. You  have a fever. Get help right away if: You have trouble breathing. You have chest pain. You vomit blood. These symptoms may be an emergency. Get help right away. Call 911. Do not wait to see if the symptoms will go away. Do not drive yourself to the hospital. Summary After the procedure, it is common to have a sore throat, hoarseness, nausea, vomiting, or to feel weak, sleepy, or fatigue. For the time period you were told by your health care provider, do not drive or use machinery. Get help right away if you have difficulty breathing, have chest pain, or vomit blood. These symptoms may be an  emergency. This information is not intended to replace advice given to you by your health care provider. Make sure you discuss any questions you have with your health care provider. Document Revised: 07/08/2021 Document Reviewed: 07/08/2021 Elsevier Patient Education  2024 Else[employer] Inc.How to Use Chlorhexidine at Home in the Shower Chlorhexidine gluconate (CHG) is a germ-killing (antiseptic) wash that's used to clean the skin. It can get rid of the germs that normally live on the skin and can keep them away for about 24 hours. If you're having surgery, you may be told to shower with CHG at home the night before surgery. This can help lower your risk for infection. To use CHG wash in the shower, follow the steps below. Supplies needed: CHG body wash. Clean washcloth. Clean towel. How to use CHG in the shower Follow these steps unless you're told to use CHG in a different way: Start the shower. Use your normal soap and shampoo to wash your face and hair. Turn off the shower or move out of the shower stream. Pour CHG onto a clean washcloth. Do not use any type of brush or rough sponge. Start at your neck, washing your body down to your toes. Make sure you: Wash the part of your body where the surgery will be done for at least 1 minute. Do not scrub. Do not use CHG on your head or face unless your  health care provider tells you to. If it gets into your ears or eyes, rinse them well with water. Do not wash your genitals with CHG. Wash your back and under your arms. Make sure to wash skin folds. Let the CHG sit on your skin for 1-2 minutes or as long as told. Rinse your entire body in the shower, including all body creases and folds. Turn off the shower. Dry off with a clean towel. Do not put anything on your skin afterward, such as powder, lotion, or perfume. Put on clean clothes or pajamas. If it's the night before surgery, sleep in clean sheets. General tips Use CHG only as told, and follow the instructions on the label. Use the full amount of CHG as told. This is often one bottle. Do not smoke and stay away from flames after using CHG. Your skin may feel sticky after using CHG. This is normal. The sticky feeling will go away as the CHG dries. Do not use CHG: If you have a chlorhexidine allergy or have reacted to chlorhexidine in the past. On open wounds or areas of skin that have broken skin, cuts, or scrapes. On babies younger than 21 months of age. Contact a health care provider if: You have questions about using CHG. Your skin gets irritated or itchy. You have a rash after using CHG. You swallow any CHG. Call your local poison control center [phone] in the [place] Your eyes itch badly, or they become very red or swollen. Your hearing changes. You have trouble seeing. If you can't reach your provider, go to an urgent care or emergency room. Do not drive yourself. Get help right away if: You have swelling or tingling in your mouth or throat. You make high-pitched whistling sounds when you breathe, most often when you breathe out (wheeze). You have trouble breathing. These symptoms may be an emergency. Call 911 right away. Do not wait to see if the symptoms will go away. Do not drive yourself to the hospital. This information is not intended to replace advice given to  you by your health care  provider. Make sure you discuss any questions you have with your health care provider. Document Revised: 10/24/2022 Document Reviewed: 10/20/2021 Elsevier Patient Education  2024 [employer].

## 2023-10-09 ENCOUNTER — Encounter (HOSPITAL_COMMUNITY)
Admission: RE | Admit: 2023-10-09 | Discharge: 2023-10-09 | Disposition: A | Discharge status: Home / Self Care | Source: Ambulatory Visit | Attending: General Surgery | Admitting: General Surgery

## 2023-10-09 ENCOUNTER — Other Ambulatory Visit: Payer: Self-pay

## 2023-10-09 ENCOUNTER — Encounter (HOSPITAL_COMMUNITY): Payer: Self-pay

## 2023-10-09 VITALS — BP 152/92 | HR 96 | Resp 18 | Ht 62.0 in | Wt 261.0 lb

## 2023-10-09 DIAGNOSIS — Z01812 Encounter for preprocedural laboratory examination: Secondary | ICD-10-CM | POA: Diagnosis not present

## 2023-10-09 DIAGNOSIS — Z01818 Encounter for other preprocedural examination: Secondary | ICD-10-CM

## 2023-10-09 LAB — POCT PREGNANCY, URINE: Preg Test, Ur: NEGATIVE

## 2023-10-12 ENCOUNTER — Other Ambulatory Visit: Payer: Self-pay

## 2023-10-12 ENCOUNTER — Encounter (HOSPITAL_COMMUNITY): Payer: Self-pay | Admitting: General Surgery

## 2023-10-12 ENCOUNTER — Ambulatory Visit (HOSPITAL_BASED_OUTPATIENT_CLINIC_OR_DEPARTMENT_OTHER): Payer: Self-pay | Admitting: Certified Registered"

## 2023-10-12 ENCOUNTER — Encounter (HOSPITAL_COMMUNITY)
Admission: RE | Disposition: A | Discharge status: Home / Self Care | Payer: Self-pay | Source: Home / Self Care | Attending: General Surgery

## 2023-10-12 ENCOUNTER — Ambulatory Visit (HOSPITAL_COMMUNITY)
Admission: RE | Admit: 2023-10-12 | Discharge: 2023-10-12 | Disposition: A | Discharge status: Home / Self Care | Attending: General Surgery | Admitting: General Surgery

## 2023-10-12 ENCOUNTER — Ambulatory Visit (HOSPITAL_COMMUNITY): Payer: Self-pay | Admitting: Certified Registered"

## 2023-10-12 DIAGNOSIS — I10 Essential (primary) hypertension: Secondary | ICD-10-CM | POA: Diagnosis not present

## 2023-10-12 DIAGNOSIS — K641 Second degree hemorrhoids: Secondary | ICD-10-CM | POA: Diagnosis not present

## 2023-10-12 DIAGNOSIS — K644 Residual hemorrhoidal skin tags: Secondary | ICD-10-CM | POA: Diagnosis not present

## 2023-10-12 DIAGNOSIS — D171 Benign lipomatous neoplasm of skin and subcutaneous tissue of trunk: Secondary | ICD-10-CM | POA: Insufficient documentation

## 2023-10-12 DIAGNOSIS — K648 Other hemorrhoids: Secondary | ICD-10-CM | POA: Diagnosis not present

## 2023-10-12 HISTORY — PX: HEMORRHOID SURGERY: SHX153

## 2023-10-12 SURGERY — HEMORRHOIDECTOMY
Anesthesia: General | Site: Rectum

## 2023-10-12 MED ORDER — PROPOFOL 10 MG/ML IV BOLUS
INTRAVENOUS | Status: AC
Start: 2023-10-12 — End: 2023-10-12
  Filled 2023-10-12: qty 20

## 2023-10-12 MED ORDER — LACTATED RINGERS IV SOLN: INTRAVENOUS | Status: DC

## 2023-10-12 MED ORDER — LIDOCAINE VISCOUS HCL 2 % MT SOLN
OROMUCOSAL | Status: DC | PRN
  Administered 2023-10-12: 1

## 2023-10-12 MED ORDER — FENTANYL CITRATE (PF) 100 MCG/2ML IJ SOLN
INTRAMUSCULAR | Status: DC | PRN
  Administered 2023-10-12 (×4): 50 ug via INTRAVENOUS

## 2023-10-12 MED ORDER — CHLORHEXIDINE GLUCONATE CLOTH 2 % EX PADS
6.0000 | MEDICATED_PAD | Freq: Once | CUTANEOUS | Status: DC
Start: 2023-10-12 — End: 2023-10-12

## 2023-10-12 MED ORDER — SODIUM CHLORIDE 0.9 % IV SOLN
2.0000 g | INTRAVENOUS | Status: AC
  Administered 2023-10-12: 2 g via INTRAVENOUS
  Filled 2023-10-12: qty 2

## 2023-10-12 MED ORDER — LIDOCAINE 2% (20 MG/ML) 5 ML SYRINGE
INTRAMUSCULAR | Status: DC | PRN
Start: 2023-10-12 — End: 2023-10-12
  Administered 2023-10-12: 100 mg via INTRAVENOUS

## 2023-10-12 MED ORDER — MIDAZOLAM HCL 2 MG/2ML IJ SOLN
INTRAMUSCULAR | Status: AC
Start: 2023-10-12 — End: 2023-10-12
  Filled 2023-10-12: qty 2

## 2023-10-12 MED ORDER — BUPIVACAINE HCL (PF) 0.5 % IJ SOLN
INTRAMUSCULAR | Status: AC
Start: 2023-10-12 — End: 2023-10-12
  Filled 2023-10-12: qty 30

## 2023-10-12 MED ORDER — SURGILUBE EX GEL
CUTANEOUS | Status: DC | PRN
  Administered 2023-10-12: 1 via TOPICAL

## 2023-10-12 MED ORDER — LIDOCAINE 2% (20 MG/ML) 5 ML SYRINGE
INTRAMUSCULAR | Status: AC
Start: 2023-10-12 — End: 2023-10-12
  Filled 2023-10-12: qty 5

## 2023-10-12 MED ORDER — SUCCINYLCHOLINE CHLORIDE 200 MG/10ML IV SOSY
PREFILLED_SYRINGE | INTRAVENOUS | Status: AC
  Filled 2023-10-12: qty 10

## 2023-10-12 MED ORDER — PROPOFOL 500 MG/50ML IV EMUL
INTRAVENOUS | Status: DC | PRN
  Administered 2023-10-12: 250 mg via INTRAVENOUS
  Administered 2023-10-12: 50 mg via INTRAVENOUS

## 2023-10-12 MED ORDER — PHENYLEPHRINE 80 MCG/ML (10ML) SYRINGE FOR IV PUSH (FOR BLOOD PRESSURE SUPPORT)
PREFILLED_SYRINGE | INTRAVENOUS | Status: DC | PRN
  Administered 2023-10-12: 160 ug via INTRAVENOUS

## 2023-10-12 MED ORDER — OXYCODONE HCL 5 MG PO TABS: 5.0000 mg | ORAL_TABLET | Freq: Once | ORAL | Status: DC | PRN

## 2023-10-12 MED ORDER — DEXAMETHASONE SODIUM PHOSPHATE 10 MG/ML IJ SOLN
INTRAMUSCULAR | Status: DC | PRN
Start: 2023-10-12 — End: 2023-10-12
  Administered 2023-10-12: 10 mg via INTRAVENOUS

## 2023-10-12 MED ORDER — KETOROLAC TROMETHAMINE 30 MG/ML IJ SOLN
30.0000 mg | Freq: Once | INTRAMUSCULAR | Status: AC
  Administered 2023-10-12: 30 mg via INTRAVENOUS
  Filled 2023-10-12: qty 1

## 2023-10-12 MED ORDER — ONDANSETRON HCL 4 MG/2ML IJ SOLN
INTRAMUSCULAR | Status: DC | PRN
Start: 2023-10-12 — End: 2023-10-12
  Administered 2023-10-12: 4 mg via INTRAVENOUS

## 2023-10-12 MED ORDER — FENTANYL CITRATE (PF) 100 MCG/2ML IJ SOLN
INTRAMUSCULAR | Status: AC
  Filled 2023-10-12: qty 2

## 2023-10-12 MED ORDER — CHLORHEXIDINE GLUCONATE 0.12 % MT SOLN
15.0000 mL | Freq: Once | OROMUCOSAL | Status: DC
  Filled 2023-10-12: qty 15

## 2023-10-12 MED ORDER — MIDAZOLAM HCL 2 MG/2ML IJ SOLN
INTRAMUSCULAR | Status: DC | PRN
  Administered 2023-10-12: 2 mg via INTRAVENOUS

## 2023-10-12 MED ORDER — DEXAMETHASONE SODIUM PHOSPHATE 10 MG/ML IJ SOLN
INTRAMUSCULAR | Status: AC
Start: 2023-10-12 — End: 2023-10-12
  Filled 2023-10-12: qty 1

## 2023-10-12 MED ORDER — DEXMEDETOMIDINE HCL IN NACL 80 MCG/20ML IV SOLN
INTRAVENOUS | Status: AC
  Filled 2023-10-12: qty 20

## 2023-10-12 MED ORDER — OXYCODONE HCL 5 MG PO TABS: 5.0000 mg | ORAL_TABLET | ORAL | 0 refills | Status: DC | PRN

## 2023-10-12 MED ORDER — CHLORHEXIDINE GLUCONATE CLOTH 2 % EX PADS: 6.0000 | MEDICATED_PAD | Freq: Once | CUTANEOUS | Status: DC

## 2023-10-12 MED ORDER — OXYCODONE HCL 5 MG/5ML PO SOLN: 5.0000 mg | Freq: Once | ORAL | Status: DC | PRN

## 2023-10-12 MED ORDER — LIDOCAINE VISCOUS HCL 2 % MT SOLN
OROMUCOSAL | Status: AC
  Filled 2023-10-12: qty 15

## 2023-10-12 MED ORDER — ORAL CARE MOUTH RINSE: 15.0000 mL | Freq: Once | OROMUCOSAL | Status: DC

## 2023-10-12 MED ORDER — SODIUM CHLORIDE 0.9 % IR SOLN
Status: DC | PRN
  Administered 2023-10-12: 1000 mL

## 2023-10-12 MED ORDER — ONDANSETRON HCL 4 MG/2ML IJ SOLN
INTRAMUSCULAR | Status: AC
  Filled 2023-10-12: qty 2

## 2023-10-12 MED ORDER — FENTANYL CITRATE PF 50 MCG/ML IJ SOSY
25.0000 ug | PREFILLED_SYRINGE | INTRAMUSCULAR | Status: DC | PRN
  Administered 2023-10-12: 50 ug via INTRAVENOUS
  Filled 2023-10-12: qty 1

## 2023-10-12 MED ORDER — SUCCINYLCHOLINE CHLORIDE 200 MG/10ML IV SOSY
PREFILLED_SYRINGE | INTRAVENOUS | Status: DC | PRN
  Administered 2023-10-12: 140 mg via INTRAVENOUS

## 2023-10-12 MED ORDER — ROCURONIUM BROMIDE 10 MG/ML (PF) SYRINGE
PREFILLED_SYRINGE | INTRAVENOUS | Status: AC
  Filled 2023-10-12: qty 10

## 2023-10-12 MED ORDER — BUPIVACAINE HCL (PF) 0.5 % IJ SOLN
INTRAMUSCULAR | Status: DC | PRN
  Administered 2023-10-12: 10 mL

## 2023-10-12 MED ORDER — LIDOCAINE 2% (20 MG/ML) 5 ML SYRINGE
INTRAMUSCULAR | Status: AC
  Filled 2023-10-12: qty 5

## 2023-10-12 MED ORDER — PHENYLEPHRINE 80 MCG/ML (10ML) SYRINGE FOR IV PUSH (FOR BLOOD PRESSURE SUPPORT)
PREFILLED_SYRINGE | INTRAVENOUS | Status: AC
Start: 2023-10-12 — End: 2023-10-12
  Filled 2023-10-12: qty 10

## 2023-10-12 MED ORDER — ONDANSETRON HCL 4 MG/2ML IJ SOLN: 4.0000 mg | Freq: Once | INTRAMUSCULAR | Status: DC | PRN

## 2023-10-12 SURGICAL SUPPLY — 27 items
CLOTH BEACON ORANGE TIMEOUT ST (SAFETY) ×2 IMPLANT
COVER LIGHT HANDLE (MISCELLANEOUS) IMPLANT
COVER LIGHT HANDLE STERIS (MISCELLANEOUS) ×4 IMPLANT
DISSECTOR SURG LIGASURE 21 (MISCELLANEOUS) ×2 IMPLANT
DRAPE HALF SHEET 40X57 (DRAPES) ×2 IMPLANT
ELECTRODE REM PT RTRN 9FT ADLT (ELECTROSURGICAL) ×2 IMPLANT
GAUZE 4X4 16PLY ~~LOC~~+RFID DBL (SPONGE) ×2 IMPLANT
GAUZE SPONGE 4X4 12PLY STRL (GAUZE/BANDAGES/DRESSINGS) ×4 IMPLANT
GLOVE BIOGEL PI IND STRL 7.0 (GLOVE) ×4 IMPLANT
GLOVE SURG SS PI 7.5 STRL IVOR (GLOVE) ×4 IMPLANT
GOWN STRL REUS W/TWL LRG LVL3 (GOWN DISPOSABLE) ×4 IMPLANT
HEMOSTAT SURGICEL 4X8 (HEMOSTASIS) ×2 IMPLANT
KIT TURNOVER CYSTO (KITS) ×2 IMPLANT
MANIFOLD NEPTUNE II (INSTRUMENTS) ×2 IMPLANT
NDL HYPO 18GX1.5 BLUNT FILL (NEEDLE) ×2 IMPLANT
NDL HYPO 21X1.5 SAFETY (NEEDLE) ×2 IMPLANT
NEEDLE HYPO 18GX1.5 BLUNT FILL (NEEDLE) ×1 IMPLANT
NEEDLE HYPO 21X1.5 SAFETY (NEEDLE) ×1 IMPLANT
NS IRRIG 1000ML POUR BTL (IV SOLUTION) ×2 IMPLANT
PACK PERI GYN (CUSTOM PROCEDURE TRAY) ×2 IMPLANT
PAD ARMBOARD POSITIONER FOAM (MISCELLANEOUS) ×2 IMPLANT
PENCIL SMOKE EVACUATOR (MISCELLANEOUS) ×2 IMPLANT
POSITIONER HEAD 8X9X4 ADT (SOFTGOODS) ×2 IMPLANT
SET BASIN LINEN APH (SET/KITS/TRAYS/PACK) ×2 IMPLANT
SURGILUBE 2OZ TUBE FLIPTOP (MISCELLANEOUS) ×2 IMPLANT
SUT SILK 0 FSL (SUTURE) ×2 IMPLANT
SYR 30ML LL (SYRINGE) ×4 IMPLANT

## 2023-10-12 NOTE — Transfer of Care (Signed)
 Immediate Anesthesia Transfer of Care Note  Patient: Denise Walters  Procedure(s) Performed: HEMORRHOIDECTOMY (Rectum)  Patient Location: PACU  Anesthesia Type:General  Level of Consciousness: awake and drowsy  Airway & Oxygen Therapy: Patient Spontanous Breathing and Patient connected to face mask oxygen  Post-op Assessment: Report given to RN and Post -op Vital signs reviewed and stable  Post vital signs: Reviewed and stable  Last Vitals:  Vitals Value Taken Time  BP 113/71 10/12/23 08:21  Temp 97.8 10/12/23 0821  Pulse 89 10/12/23 08:23  Resp 27 10/12/23 08:23  SpO2 100 % 10/12/23 08:23  Vitals shown include unfiled device data.  Last Pain:  Vitals:   10/12/23 0627  TempSrc: Oral  PainSc: 0-No pain         Complications: No notable events documented.

## 2023-10-12 NOTE — Anesthesia Procedure Notes (Signed)
 Procedure Name: Intubation Date/Time: 10/12/2023 7:35 AM  Performed by: Coretha Dew, MDPre-anesthesia Checklist: Patient identified, Emergency Drugs available, Suction available and Patient being monitored Patient Re-evaluated:Patient Re-evaluated prior to induction Oxygen Delivery Method: Circle system utilized Preoxygenation: Pre-oxygenation with 100% oxygen Induction Type: IV induction Ventilation: Oral airway inserted - appropriate to patient size and Mask ventilation without difficulty Laryngoscope Size: Mac and 3 Grade View: Grade I Tube type: Oral Tube size: 7.0 mm Number of attempts: 1 Airway Equipment and Method: Stylet Placement Confirmation: ETT inserted through vocal cords under direct vision, positive ETCO2, breath sounds checked- equal and bilateral and CO2 detector Secured at: 21 cm Tube secured with: Tape Dental Injury: Teeth and Oropharynx as per pre-operative assessment

## 2023-10-12 NOTE — Progress Notes (Signed)
 To Whom it may Concern,  Ms Evora had surgery today.  May return to work 10/18/2023.  No lifting over 10 lbs until she see Dr Larrie Po on 10/23/2023.  Evelene Hint RN

## 2023-10-12 NOTE — Interval H&P Note (Signed)
 History and Physical Interval Note:  10/12/2023 7:10 AM  Denise Walters  has presented today for surgery, with the diagnosis of EXTERNAL HEMORRHOID.  The various methods of treatment have been discussed with the patient and family. After consideration of risks, benefits and other options for treatment, the patient has consented to  Procedure(s) with comments: HEMORRHOIDECTOMY (N/A) - SIMPLE as a surgical intervention.  The patient's history has been reviewed, patient examined, no change in status, stable for surgery.  I have reviewed the patient's chart and labs.  Questions were answered to the patient's satisfaction.     Alanda Allegra

## 2023-10-12 NOTE — Anesthesia Postprocedure Evaluation (Signed)
 Anesthesia Post Note  Patient: Denise Walters  Procedure(s) Performed: HEMORRHOIDECTOMY (Rectum)  Patient location during evaluation: Phase II Anesthesia Type: General Level of consciousness: awake Pain management: pain level controlled Vital Signs Assessment: post-procedure vital signs reviewed and stable Respiratory status: spontaneous breathing and respiratory function stable Cardiovascular status: blood pressure returned to baseline and stable Postop Assessment: no headache and no apparent nausea or vomiting Anesthetic complications: no Comments: Late entry   No notable events documented.   Last Vitals:  Vitals:   10/12/23 0930 10/12/23 0958  BP: 126/88 134/84  Pulse:  88  Resp: (!) 9 16  Temp:  36.5 C  SpO2: 100% 98%    Last Pain:  Vitals:   10/12/23 0958  TempSrc: Oral  PainSc: 8                  Coretha Dew

## 2023-10-12 NOTE — Anesthesia Preprocedure Evaluation (Signed)
 Anesthesia Evaluation  Patient identified by MRN, date of birth, ID band Patient awake    Reviewed: Allergy & Precautions, H&P , NPO status , Patient's Chart, lab work & pertinent test results, reviewed documented beta blocker date and time   Airway Mallampati: II  TM Distance: >3 FB Neck ROM: full    Dental no notable dental hx.    Pulmonary neg pulmonary ROS   Pulmonary exam normal breath sounds clear to auscultation       Cardiovascular Exercise Tolerance: Good hypertension,  Rhythm:regular Rate:Normal     Neuro/Psych  Headaches, Seizures -,   negative psych ROS   GI/Hepatic negative GI ROS, Neg liver ROS,,,  Endo/Other  negative endocrine ROS    Renal/GU negative Renal ROS  negative genitourinary   Musculoskeletal   Abdominal   Peds  Hematology negative hematology ROS (+)   Anesthesia Other Findings   Reproductive/Obstetrics negative OB ROS                             Anesthesia Physical Anesthesia Plan  ASA: 3  Anesthesia Plan: General and General ETT   Post-op Pain Management:    Induction:   PONV Risk Score and Plan: Ondansetron   Airway Management Planned:   Additional Equipment:   Intra-op Plan:   Post-operative Plan:   Informed Consent: I have reviewed the patients History and Physical, chart, labs and discussed the procedure including the risks, benefits and alternatives for the proposed anesthesia with the patient or authorized representative who has indicated his/her understanding and acceptance.     Dental Advisory Given  Plan Discussed with: CRNA  Anesthesia Plan Comments:        Anesthesia Quick Evaluation

## 2023-10-12 NOTE — Op Note (Signed)
 Patient:  Denise Walters  DOB:  April 17, 1981  MRN:  [id number]   Preop Diagnosis: Hemorrhoidal disease  Postop Diagnosis: External hemorrhoidal skin tag, internal and external hemorrhoid  Procedure: Internal and external hemorrhoidectomy  Surgeon: Alanda Allegra, MD  Anes: General endotracheal  Indications: Patient is a 43 year old black female who presents with hemorrhoidal disease.  The risks and benefits of the procedure including bleeding, infection, and recurrence of the hemorrhoids were fully explained to the patient, who gave informed consent.  Procedure note: The patient was placed in the lithotomy position after induction of general endotracheal anesthesia.  The perineum was prepped and draped using the usual sterile technique with Betadine.  Surgical site confirmation was performed.  On anoscopy, the patient had an external hemorrhoidal skin tag at the 6 o'clock position.  In addition, an internal and external hemorrhoid was noted at the 4 o'clock position.  Both were removed in a column like fashion using the hand-held LigaSure.  Care was taken to avoid the external sphincter mechanism.  No other significant hemorrhoidal disease was noted.  0.5% Sensorcaine was instilled into the surrounding wound.  Surgicel and viscous Xylocaine  rectal packing was then placed.  All tape and needle counts were correct at the end of the procedure.  The patient was extubated in the operating room and transferred to the PACU in stable condition.  Complications: None  EBL: Minimal  Specimen: Hemorrhoids

## 2023-10-13 ENCOUNTER — Encounter (HOSPITAL_COMMUNITY): Payer: Self-pay | Admitting: General Surgery

## 2023-10-15 LAB — SURGICAL PATHOLOGY

## 2023-10-23 ENCOUNTER — Encounter: Payer: Self-pay | Admitting: General Surgery

## 2023-10-23 ENCOUNTER — Ambulatory Visit (INDEPENDENT_AMBULATORY_CARE_PROVIDER_SITE_OTHER): Admitting: General Surgery

## 2023-10-23 VITALS — BP 134/88 | HR 98 | Temp 98.2°F | Resp 14 | Ht 62.0 in | Wt 255.0 lb

## 2023-10-23 DIAGNOSIS — Z09 Encounter for follow-up examination after completed treatment for conditions other than malignant neoplasm: Secondary | ICD-10-CM

## 2023-10-23 NOTE — Progress Notes (Signed)
 Subjective:     Denise Walters  Here for postoperative visit, status post hemorrhoidectomy.  Patient does have some soreness of the anus.  No bleeding noted.  No incontinence noted.  She is keeping the area clean with medicated wipes. Objective:    BP 134/88   Pulse 98   Temp 98.2 F (36.8 C) (Oral)   Resp 14   Ht 5' 2 (1.575 m)   Wt 255 lb (115.7 kg)   LMP 09/17/2023 (Exact Date)   SpO2 96%   BMI 46.64 kg/m   General:  alert, cooperative, and no distress  Rectal examination reveals a healing anus.  No bleeding noted.     Assessment:    Doing well postoperatively.    Plan:   I reassured the patient that she will continue to improve and the pain will decrease over the next few weeks.  She understands.  Follow-up here as needed.

## 2023-11-19 ENCOUNTER — Emergency Department (HOSPITAL_COMMUNITY)

## 2023-11-19 ENCOUNTER — Other Ambulatory Visit: Payer: Self-pay

## 2023-11-19 ENCOUNTER — Emergency Department (HOSPITAL_COMMUNITY)
Admission: EM | Admit: 2023-11-19 | Discharge: 2023-11-19 | Disposition: A | Discharge status: Home / Self Care | Attending: Emergency Medicine | Admitting: Emergency Medicine

## 2023-11-19 ENCOUNTER — Encounter (HOSPITAL_COMMUNITY): Payer: Self-pay

## 2023-11-19 DIAGNOSIS — J029 Acute pharyngitis, unspecified: Secondary | ICD-10-CM | POA: Insufficient documentation

## 2023-11-19 DIAGNOSIS — I10 Essential (primary) hypertension: Secondary | ICD-10-CM | POA: Diagnosis not present

## 2023-11-19 DIAGNOSIS — M791 Myalgia, unspecified site: Secondary | ICD-10-CM | POA: Diagnosis not present

## 2023-11-19 DIAGNOSIS — R059 Cough, unspecified: Secondary | ICD-10-CM | POA: Insufficient documentation

## 2023-11-19 DIAGNOSIS — R000 Tachycardia, unspecified: Secondary | ICD-10-CM | POA: Diagnosis not present

## 2023-11-19 DIAGNOSIS — Z79899 Other long term (current) drug therapy: Secondary | ICD-10-CM | POA: Diagnosis not present

## 2023-11-19 LAB — CBC
HCT: 32.2 % — ABNORMAL LOW (ref 36.0–46.0)
Hemoglobin: 9.5 g/dL — ABNORMAL LOW (ref 12.0–15.0)
MCH: 23.5 pg — ABNORMAL LOW (ref 26.0–34.0)
MCHC: 29.5 g/dL — ABNORMAL LOW (ref 30.0–36.0)
MCV: 79.5 fL — ABNORMAL LOW (ref 80.0–100.0)
Platelets: 226 K/uL (ref 150–400)
RBC: 4.05 MIL/uL (ref 3.87–5.11)
RDW: 17.7 % — ABNORMAL HIGH (ref 11.5–15.5)
WBC: 14.4 K/uL — ABNORMAL HIGH (ref 4.0–10.5)
nRBC: 0 % (ref 0.0–0.2)

## 2023-11-19 LAB — URINALYSIS, ROUTINE W REFLEX MICROSCOPIC
Bilirubin Urine: NEGATIVE
Glucose, UA: NEGATIVE mg/dL
Hgb urine dipstick: NEGATIVE
Ketones, ur: NEGATIVE mg/dL
Leukocytes,Ua: NEGATIVE
Nitrite: NEGATIVE
Protein, ur: NEGATIVE mg/dL
Specific Gravity, Urine: 1.02 (ref 1.005–1.030)
pH: 6 (ref 5.0–8.0)

## 2023-11-19 LAB — BASIC METABOLIC PANEL WITH GFR
Anion gap: 9 (ref 5–15)
BUN: 9 mg/dL (ref 6–20)
CO2: 24 mmol/L (ref 22–32)
Calcium: 8.4 mg/dL — ABNORMAL LOW (ref 8.9–10.3)
Chloride: 100 mmol/L (ref 98–111)
Creatinine, Ser: 0.91 mg/dL (ref 0.44–1.00)
GFR, Estimated: >60 mL/min (ref >60)
Glucose, Bld: 104 mg/dL — ABNORMAL HIGH (ref 70–99)
Potassium: 2.9 mmol/L — ABNORMAL LOW (ref 3.5–5.1)
Sodium: 133 mmol/L — ABNORMAL LOW (ref 135–145)

## 2023-11-19 LAB — PREGNANCY, URINE: Preg Test, Ur: NEGATIVE

## 2023-11-19 LAB — RESP PANEL BY RT-PCR (RSV, FLU A&B, COVID)  RVPGX2
Influenza A by PCR: NEGATIVE
Influenza B by PCR: NEGATIVE
Resp Syncytial Virus by PCR: NEGATIVE
SARS Coronavirus 2 by RT PCR: NEGATIVE

## 2023-11-19 LAB — MONONUCLEOSIS SCREEN: Mono Screen: NEGATIVE

## 2023-11-19 LAB — GROUP A STREP BY PCR: Group A Strep by PCR: NOT DETECTED

## 2023-11-19 MED ORDER — METHYLPREDNISOLONE SODIUM SUCC 125 MG IJ SOLR
125.0000 mg | Freq: Once | INTRAMUSCULAR | Status: AC
  Administered 2023-11-19: 125 mg via INTRAVENOUS
  Filled 2023-11-19: qty 2

## 2023-11-19 MED ORDER — PREDNISONE 10 MG (21) PO TBPK: ORAL_TABLET | Freq: Every day | ORAL | 0 refills | Status: DC

## 2023-11-19 MED ORDER — AZITHROMYCIN 250 MG PO TABS: 250.0000 mg | ORAL_TABLET | Freq: Every day | ORAL | 0 refills | Status: DC

## 2023-11-19 MED ORDER — IOHEXOL 300 MG/ML  SOLN
75.0000 mL | Freq: Once | INTRAMUSCULAR | Status: AC | PRN
  Administered 2023-11-19: 75 mL via INTRAVENOUS

## 2023-11-19 MED ORDER — LIDOCAINE VISCOUS HCL 2 % MT SOLN
15.0000 mL | Freq: Once | OROMUCOSAL | Status: AC
  Administered 2023-11-19: 15 mL via OROMUCOSAL
  Filled 2023-11-19: qty 15

## 2023-11-19 MED ORDER — ONDANSETRON HCL 4 MG/2ML IJ SOLN
4.0000 mg | Freq: Once | INTRAMUSCULAR | Status: AC
  Administered 2023-11-19: 4 mg via INTRAVENOUS
  Filled 2023-11-19: qty 2

## 2023-11-19 MED ORDER — ACETAMINOPHEN 325 MG PO TABS: 650.0000 mg | ORAL_TABLET | Freq: Four times a day (QID) | ORAL | 0 refills | Status: DC | PRN

## 2023-11-19 MED ORDER — LIDOCAINE VISCOUS HCL 2 % MT SOLN: 15.0000 mL | OROMUCOSAL | 0 refills | Status: DC | PRN

## 2023-11-19 MED ORDER — SODIUM CHLORIDE 0.9 % IV SOLN: 3.0000 g | Freq: Once | INTRAVENOUS | Status: DC

## 2023-11-19 MED ORDER — IBUPROFEN 600 MG PO TABS: 600.0000 mg | ORAL_TABLET | Freq: Four times a day (QID) | ORAL | 0 refills | Status: DC | PRN

## 2023-11-19 MED ORDER — KETOROLAC TROMETHAMINE 15 MG/ML IJ SOLN
15.0000 mg | Freq: Once | INTRAMUSCULAR | Status: AC
  Administered 2023-11-19: 15 mg via INTRAVENOUS
  Filled 2023-11-19: qty 1

## 2023-11-19 MED ORDER — SODIUM CHLORIDE 0.9 % IV BOLUS
1000.0000 mL | Freq: Once | INTRAVENOUS | Status: AC
  Administered 2023-11-19: 1000 mL via INTRAVENOUS

## 2023-11-19 NOTE — ED Notes (Signed)
 Crackers and Cup of water given to patient.

## 2023-11-19 NOTE — ED Provider Notes (Addendum)
 [place] EMERGENCY DEPARTMENT AT [facility] Provider Note  CSN: [id number] Arrival date & time: 11/19/23 9372  Chief Complaint(s) Sore Throat  HPI Denise Walters is a 43 y.o. female with past medical history as below, significant for hemorrhoids, hypertension who presents to the ED with complaint of sore throat  Patient works at a daycare, multiple sick contacts with similar sounding symptoms.  Began having sore throat on Saturday, pain with swallowing, feels her voice is hoarse.  Been coughing up some phlegm.  Had bodyaches but no fevers, no chills.  No difficulty breathing.  Reduced p.o. intake secondary to discomfort with swallowing.  Has not tried any medications at home for her symptoms  Past Medical History Past Medical History:  Diagnosis Date   Abscess    Chronic headache    Elevated lipids    History of seizure 2010   Hypertension    Seizures (HCC)    Tonsillar and adenoid hypertrophy    Patient Active Problem List   Diagnosis Date Noted   Grade II hemorrhoids 10/12/2023   Residual hemorrhoidal skin tags 10/12/2023   Chest pain 01/22/2018   Fatty liver 01/22/2018   Rectal bleeding 06/15/2016   FH: colon cancer 06/15/2016   Constipation 06/15/2016   Home Medication(s) Prior to Admission medications   Medication Sig Start Date End Date Taking? Authorizing Provider  amLODipine  (NORVASC ) 10 MG tablet Take 10 mg by mouth daily.    [provider]  carvedilol  (COREG ) 25 MG tablet Take 25 mg by mouth 2 (two) times daily. 07/25/17   [provider]  Cholecalciferol (VITAMIN D3) 1.25 MG (50000 UT) CAPS Take 50,000 Units by mouth every Monday. 07/16/23   [provider]  ferrous sulfate 325 (65 FE) MG tablet Take 325 mg by mouth daily.    [provider]  lisinopril  (PRINIVIL ,ZESTRIL ) 20 MG tablet Take 1 tablet by mouth daily. 07/25/17   [provider]  lisinopril -hydrochlorothiazide  (PRINZIDE ,ZESTORETIC ) 20-25 MG tablet  Take 1 tablet by mouth daily. Add to the lisinopril  20 mg    [provider]  oxyCODONE  (ROXICODONE ) 5 MG immediate release tablet Take 1 tablet (5 mg total) by mouth every 4 (four) hours as needed. 10/12/23 10/11/24  Mavis Anes, MD                                                                                                                                    Past Surgical History Past Surgical History:  Procedure Laterality Date   BREAST REDUCTION SURGERY     CESAREAN SECTION  10/05/2003   CHOLECYSTECTOMY  12/01/2003   COLONOSCOPY N/A 06/28/2016   Dr. Harvey: diverticulosis, hemorrhoids. next tcs in 06/2021 due to [facility].   HEMORRHOID SURGERY N/A 10/12/2023   Procedure: HEMORRHOIDECTOMY;  Surgeon: Mavis Anes, MD;  Location: [facility];  Service: General;  Laterality: N/A;   TONSILLECTOMY     TONSILLECTOMY AND ADENOIDECTOMY Bilateral  05/05/2014   Procedure: BILATERAL TONSILLECTOMY  W/  ABLATATION OF ADENOIDS;  Surgeon: Ana LELON Moccasin, MD;  Location: [facility];  Service: ENT;  Laterality: Bilateral;   TUBAL LIGATION  01/08/2004   Family History Family History  Problem Relation Age of Onset   Colon cancer Mother    Heart attack Father    Hypertension Brother     Social History Social History   Tobacco Use   Smoking status: Never   Smokeless tobacco: Never  Vaping Use   Vaping status: Never Used  Substance Use Topics   Alcohol use: No   Drug use: No   Allergies Chocolate  Review of Systems A thorough review of systems was obtained and all systems are negative except as noted in the HPI and PMH.   Physical Exam Vital Signs  I have reviewed the triage vital signs BP 135/81 (BP Location: Right Arm)   Pulse (!) 113   Temp 99.4 F (37.4 C) (Oral)   Resp 17   Ht 5' 2 (1.575 m)   Wt 124.7 kg   LMP 11/07/2023 (Exact Date)   SpO2 100%   BMI 50.30 kg/m  Physical Exam Vitals and nursing note reviewed.  Constitutional:      General: She is not in acute  distress.    Appearance: Normal appearance. She is well-developed. She is not ill-appearing.  HENT:     Head: Normocephalic and atraumatic. No right periorbital erythema or left periorbital erythema.     Jaw: There is normal jaw occlusion. No trismus.     Right Ear: External ear normal.     Left Ear: External ear normal.     Nose: Nose normal.     Mouth/Throat:     Mouth: Mucous membranes are moist.     Pharynx: Uvula midline. No uvula swelling.     Comments: No drooling stridor or trismus Eyes:     General: No scleral icterus.       Right eye: No discharge.        Left eye: No discharge.  Cardiovascular:     Rate and Rhythm: Tachycardia present.  Pulmonary:     Effort: Pulmonary effort is normal. No respiratory distress.     Breath sounds: No stridor.  Abdominal:     General: Abdomen is flat. There is no distension.     Tenderness: There is no guarding.  Musculoskeletal:        General: No deformity.     Cervical back: No rigidity.  Lymphadenopathy:     Cervical: Cervical adenopathy present.  Skin:    General: Skin is warm and dry.     Coloration: Skin is not cyanotic, jaundiced or pale.  Neurological:     Mental Status: She is alert and oriented to person, place, and time.     GCS: GCS eye subscore is 4. GCS verbal subscore is 5. GCS motor subscore is 6.  Psychiatric:        Speech: Speech normal.        Behavior: Behavior normal. Behavior is cooperative.     ED Results and Treatments Labs (all labs ordered are listed, but only abnormal results are displayed) Labs Reviewed  URINALYSIS, ROUTINE W REFLEX MICROSCOPIC - Abnormal; Notable for the following components:      Result Value   Color, Urine STRAW (*)    All other components within normal limits  CBC - Abnormal; Notable for the following components:   WBC 14.4 (*)  Hemoglobin 9.5 (*)    HCT 32.2 (*)    MCV 79.5 (*)    MCH 23.5 (*)    MCHC 29.5 (*)    RDW 17.7 (*)    All other components within normal  limits  BASIC METABOLIC PANEL WITH GFR - Abnormal; Notable for the following components:   Sodium 133 (*)    Potassium 2.9 (*)    Glucose, Bld 104 (*)    Calcium 8.4 (*)    All other components within normal limits  GROUP A STREP BY PCR  RESP PANEL BY RT-PCR (RSV, FLU A&B, COVID)  RVPGX2  MONONUCLEOSIS SCREEN  PREGNANCY, URINE                                                                                                                          Radiology CT Soft Tissue Neck W Contrast Result Date: 11/19/2023 CLINICAL DATA:  Provided history: Concern for peritonsillar abscess. Additional history provided: Sore throat, diarrhea, nausea, vomiting, weakness. EXAM: CT NECK WITH CONTRAST TECHNIQUE: Multidetector CT imaging of the neck was performed using the standard protocol following the bolus administration of intravenous contrast. RADIATION DOSE REDUCTION: This exam was performed according to the departmental dose-optimization program which includes automated exposure control, adjustment of the mA and/or kV according to patient size and/or use of iterative reconstruction technique. CONTRAST:  75mL OMNIPAQUE  IOHEXOL  300 MG/ML  SOLN COMPARISON:  Neck CT 10/21/2013. FINDINGS: Pharynx and larynx: No appreciable swelling or mass within the oral cavity, pharynx or larynx. No evidence of a tonsillar/peritonsillar abscess. No retropharyngeal collection. Salivary glands: No inflammation, mass, or stone. Thyroid: Normal. Lymph nodes: Bilateral cervical lymphadenopathy. Most notably, bilateral level II lymph nodes measure up to 14 mm in short axis. Vascular: No evidence of an acute intracranial abnormality within the field of view. Minimal atherosclerotic plaque about the right carotid bifurcation. Limited intracranial: No evidence of an acute intracranial abnormality within the field of view. Visualized orbits: Incompletely imaged. No orbital mass or acute orbital Mastoids and visualized paranasal sinuses:  Portions of the frontal, ethmoid and sphenoid sinuses are excluded from the field of view superiorly. No significant paranasal sinus disease or mastoid effusion at the imaged levels. Skeleton: Spondylosis at the cervical and visualized upper thoracic levels. No acute fracture or aggressive osseous lesion. Upper chest: No consolidation within the imaged lung apices. IMPRESSION: 1. No appreciable swelling or mass within the oral cavity, pharynx or larynx. No evidence of a peritonsillar abscess. 2. Nonspecific cervical lymphadenopathy (with lymph nodes measuring up to 14 mm in short axis). 3. Minimal atherosclerotic plaque about the right carotid bifurcation. 4. Spondylosis at the cervical and visualized upper thoracic levels. Electronically Signed   By: Rockey Childs D.O.   On: 11/19/2023 09:59    Pertinent labs & imaging results that were available during my care of the patient were reviewed by me and considered in my medical decision making (see MDM for details).  Medications Ordered in ED Medications  sodium chloride  0.9 % bolus 1,000 mL (0 mLs Intravenous Stopped 11/19/23 1005)  lidocaine  (XYLOCAINE ) 2 % viscous mouth solution 15 mL (15 mLs Mouth/Throat Given 11/19/23 0812)  iohexol  (OMNIPAQUE ) 300 MG/ML solution 75 mL (75 mLs Intravenous Contrast Given 11/19/23 0917)  ketorolac  (TORADOL ) 15 MG/ML injection 15 mg (15 mg Intravenous Given 11/19/23 1100)  methylPREDNISolone  sodium succinate (SOLU-MEDROL ) 125 mg/2 mL injection 125 mg (125 mg Intravenous Given 11/19/23 1100)  ondansetron  (ZOFRAN ) injection 4 mg (4 mg Intravenous Given 11/19/23 1110)                                                                                                                                     Procedures Procedures  (including critical care time)  Medical Decision Making / ED Course    Medical Decision Making:    SAMONA CHIHUAHUA is a 43 y.o. female with past medical history as below, significant for hemorrhoids,  hypertension who presents to the ED with complaint of sore throat. The complaint involves an extensive differential diagnosis and also carries with it a high risk of complications and morbidity.  Serious etiology was considered. Ddx includes but is not limited to: Viral pharyngitis, strep pharyngitis, peritonsillar abscess, retropharyngeal abscess, mononucleosis, viral syndrome, etc.  Complete initial physical exam performed, notably the patient was in no acute distress, airway intact.    Reviewed and confirmed nursing documentation for past medical history, family history, social history.  Vital signs reviewed.    Pharyngitis> - Centor score of 2, multiple sick contacts at [employer] - She has some tonsillar exudate on exam, uvula is midline, no drooling stridor or trismus; difficult to adequately visualize posterior oropharynx secondary to body habitus - strep is negative, mono and flu neg - CT neck w/o abscess, stable - Workup is reassuring, symptoms have improved following intervention.  Tolerating p.o. without difficulty.  No drooling stridor or trismus, no dysphonia.  Concern for possible tonsillitis or pharyngitis.  Seems likely viral.  Recommend supportive care at home, close follow-up PCP - Heart rate is elevated, she has not taken her morning medications including carvedilol      Patient in no distress and overall condition is stable. Detailed discussions were had with the patient/guardian regarding current findings, and need for close f/u with PCP or on call doctor. The patient/guardian has been instructed to return immediately if the symptoms worsen in any way for re-evaluation. Patient/guardian verbalized understanding and is in agreement with current care plan. All questions answered prior to discharge.                Additional history obtained: -Additional history obtained from na -External records from outside source obtained and reviewed including: Chart review  including previous notes, labs, imaging, consultation notes including  Allergy list, prior ER evaluation, prior labs   Lab Tests: -I ordered, reviewed, and interpreted labs.   The pertinent results include:   Labs Reviewed  URINALYSIS, ROUTINE W REFLEX MICROSCOPIC - Abnormal; Notable for the following components:      Result Value   Color, Urine STRAW (*)    All other components within normal limits  CBC - Abnormal; Notable for the following components:   WBC 14.4 (*)    Hemoglobin 9.5 (*)    HCT 32.2 (*)    MCV 79.5 (*)    MCH 23.5 (*)    MCHC 29.5 (*)    RDW 17.7 (*)    All other components within normal limits  BASIC METABOLIC PANEL WITH GFR - Abnormal; Notable for the following components:   Sodium 133 (*)    Potassium 2.9 (*)    Glucose, Bld 104 (*)    Calcium 8.4 (*)    All other components within normal limits  GROUP A STREP BY PCR  RESP PANEL BY RT-PCR (RSV, FLU A&B, COVID)  RVPGX2  MONONUCLEOSIS SCREEN  PREGNANCY, URINE    Notable for hypokalemia, leukocytosis  EKG   EKG Interpretation Date/Time:    Ventricular Rate:    PR Interval:    QRS Duration:    QT Interval:    QTC Calculation:   R Axis:      Text Interpretation:           Imaging Studies ordered: I ordered imaging studies including CT neck w I independently visualized the following imaging with scope of interpretation limited to determining acute life threatening conditions related to emergency care; findings noted above I agree with the radiologist interpretation If any imaging was obtained with contrast I closely monitored patient for any possible adverse reaction a/w contrast administration in the emergency department   Medicines ordered and prescription drug management: Meds ordered this encounter  Medications   sodium chloride  0.9 % bolus 1,000 mL   lidocaine  (XYLOCAINE ) 2 % viscous mouth solution 15 mL   iohexol  (OMNIPAQUE ) 300 MG/ML solution 75 mL   ketorolac  (TORADOL ) 15 MG/ML  injection 15 mg   DISCONTD: Ampicillin -Sulbactam (UNASYN ) 3 g in sodium chloride  0.9 % 100 mL IVPB    Antibiotic Indication::   Other Indication (list below)    Other Indication::   dental infection   methylPREDNISolone  sodium succinate (SOLU-MEDROL ) 125 mg/2 mL injection 125 mg    IV methylprednisolone  will be converted to either a q12h or q24h frequency with the same total daily dose (TDD).  Ordered Dose: 1 to 125 mg TDD; convert to: TDD q24h.  Ordered Dose: 126 to 250 mg TDD; convert to: TDD div q12h.  Ordered Dose: >250 mg TDD; DAW.   ondansetron  (ZOFRAN ) injection 4 mg    -I have reviewed the patients home medicines and have made adjustments as needed   Consultations Obtained: na   Cardiac Monitoring: The patient was maintained on a cardiac monitor.  I personally viewed and interpreted the cardiac monitored which showed an underlying rhythm of: sinus tachy  Continuous pulse oximetry interpreted by myself, 100% on ra.    Social Determinants of Health:  Diagnosis or treatment significantly limited by social determinants of health: obesity   Reevaluation: After the interventions noted above, I reevaluated the patient and found that they have improved  Co morbidities that complicate the patient evaluation  Past Medical History:  Diagnosis Date   Abscess    Chronic headache    Elevated lipids    History of seizure 2010   Hypertension    Seizures (HCC)    Tonsillar and adenoid hypertrophy  Dispostion: Disposition decision including need for hospitalization was considered, and patient discharged from emergency department.    Final Clinical Impression(s) / ED Diagnoses Final diagnoses:  None        Elnor Jayson LABOR, DO 11/19/23 1155    Elnor Jayson A, DO 11/19/23 1157

## 2023-11-19 NOTE — Discharge Instructions (Signed)
 It was a pleasure caring for you today in the emergency department.  Be sure to drink plenty of fluids, follow a soft diet to make it easier to swallow.  Take medications as prescribed.  Follow-up with your PCP for recheck.  Please return to the emergency department for any worsening or worrisome symptoms including but not limited to: Difficulty speaking, difficulty breathing or swallowing, worsening pain, fever, etc.

## 2023-11-19 NOTE — ED Triage Notes (Signed)
 Pt c/o sore throat, diarrhea, nausea, and one episode of phlegm vomiting. Pt states symptoms started on Saturday. Pt denies fever. Pt works at [employer]. Pt states weakness as well.

## 2024-02-13 ENCOUNTER — Other Ambulatory Visit: Payer: Self-pay

## 2024-02-13 ENCOUNTER — Encounter (HOSPITAL_COMMUNITY): Payer: Self-pay

## 2024-02-13 ENCOUNTER — Emergency Department (HOSPITAL_COMMUNITY)
Admission: EM | Admit: 2024-02-13 | Discharge: 2024-02-13 | Disposition: A | Discharge status: Home / Self Care | Attending: Emergency Medicine | Admitting: Emergency Medicine

## 2024-02-13 ENCOUNTER — Emergency Department (HOSPITAL_COMMUNITY)

## 2024-02-13 DIAGNOSIS — B349 Viral infection, unspecified: Secondary | ICD-10-CM | POA: Diagnosis not present

## 2024-02-13 DIAGNOSIS — R112 Nausea with vomiting, unspecified: Secondary | ICD-10-CM

## 2024-02-13 DIAGNOSIS — D72829 Elevated white blood cell count, unspecified: Secondary | ICD-10-CM | POA: Insufficient documentation

## 2024-02-13 DIAGNOSIS — R0981 Nasal congestion: Secondary | ICD-10-CM | POA: Diagnosis present

## 2024-02-13 LAB — URINALYSIS, ROUTINE W REFLEX MICROSCOPIC
Bacteria, UA: NONE SEEN
Bilirubin Urine: NEGATIVE
Glucose, UA: NEGATIVE mg/dL
Ketones, ur: NEGATIVE mg/dL
Leukocytes,Ua: NEGATIVE
Nitrite: NEGATIVE
Protein, ur: NEGATIVE mg/dL
Specific Gravity, Urine: 1.017 (ref 1.005–1.030)
pH: 5 (ref 5.0–8.0)

## 2024-02-13 LAB — CBC WITH DIFFERENTIAL/PLATELET
Abs Immature Granulocytes: 0.09 K/uL — ABNORMAL HIGH (ref 0.00–0.07)
Basophils Absolute: 0 K/uL (ref 0.0–0.1)
Basophils Relative: 0 %
Eosinophils Absolute: 0.1 K/uL (ref 0.0–0.5)
Eosinophils Relative: 1 %
HCT: 32.1 % — ABNORMAL LOW (ref 36.0–46.0)
Hemoglobin: 9.4 g/dL — ABNORMAL LOW (ref 12.0–15.0)
Immature Granulocytes: 1 %
Lymphocytes Relative: 9 %
Lymphs Abs: 1.3 K/uL (ref 0.7–4.0)
MCH: 24 pg — ABNORMAL LOW (ref 26.0–34.0)
MCHC: 29.3 g/dL — ABNORMAL LOW (ref 30.0–36.0)
MCV: 81.9 fL (ref 80.0–100.0)
Monocytes Absolute: 0.9 K/uL (ref 0.1–1.0)
Monocytes Relative: 6 %
Neutro Abs: 13.2 K/uL — ABNORMAL HIGH (ref 1.7–7.7)
Neutrophils Relative %: 83 %
Platelets: 491 K/uL — ABNORMAL HIGH (ref 150–400)
RBC: 3.92 MIL/uL (ref 3.87–5.11)
RDW: 18.2 % — ABNORMAL HIGH (ref 11.5–15.5)
WBC: 15.6 K/uL — ABNORMAL HIGH (ref 4.0–10.5)
nRBC: 0 % (ref 0.0–0.2)

## 2024-02-13 LAB — COMPREHENSIVE METABOLIC PANEL WITH GFR
ALT: 6 U/L (ref 0–44)
AST: 12 U/L — ABNORMAL LOW (ref 15–41)
Albumin: 3.9 g/dL (ref 3.5–5.0)
Alkaline Phosphatase: 98 U/L (ref 38–126)
Anion gap: 9 (ref 5–15)
BUN: 12 mg/dL (ref 6–20)
CO2: 26 mmol/L (ref 22–32)
Calcium: 8.9 mg/dL (ref 8.9–10.3)
Chloride: 100 mmol/L (ref 98–111)
Creatinine, Ser: 0.68 mg/dL (ref 0.44–1.00)
GFR, Estimated: >60 mL/min (ref >60)
Glucose, Bld: 94 mg/dL (ref 70–99)
Potassium: 4.2 mmol/L (ref 3.5–5.1)
Sodium: 135 mmol/L (ref 135–145)
Total Bilirubin: 0.5 mg/dL (ref 0.0–1.2)
Total Protein: 8.3 g/dL — ABNORMAL HIGH (ref 6.5–8.1)

## 2024-02-13 LAB — GROUP A STREP BY PCR: Group A Strep by PCR: NOT DETECTED

## 2024-02-13 LAB — LIPASE, BLOOD: Lipase: 22 U/L (ref 11–51)

## 2024-02-13 LAB — RESP PANEL BY RT-PCR (RSV, FLU A&B, COVID)  RVPGX2
Influenza A by PCR: NEGATIVE
Influenza B by PCR: NEGATIVE
Resp Syncytial Virus by PCR: NEGATIVE
SARS Coronavirus 2 by RT PCR: NEGATIVE

## 2024-02-13 LAB — TROPONIN T, HIGH SENSITIVITY: Troponin T High Sensitivity: <15 ng/L (ref 0–19)

## 2024-02-13 MED ORDER — ONDANSETRON HCL 4 MG/2ML IJ SOLN
4.0000 mg | Freq: Once | INTRAMUSCULAR | Status: AC
Start: 2024-02-13 — End: 2024-02-13
  Administered 2024-02-13: 4 mg via INTRAVENOUS
  Filled 2024-02-13: qty 2

## 2024-02-13 MED ORDER — ONDANSETRON 4 MG PO TBDP
4.0000 mg | ORAL_TABLET | Freq: Once | ORAL | Status: AC
Start: 2024-02-13 — End: 2024-02-13
  Administered 2024-02-13: 4 mg via ORAL
  Filled 2024-02-13: qty 1

## 2024-02-13 MED ORDER — LIDOCAINE VISCOUS HCL 2 % MT SOLN
15.0000 mL | Freq: Once | OROMUCOSAL | Status: AC
  Administered 2024-02-13: 15 mL via ORAL
  Filled 2024-02-13: qty 15

## 2024-02-13 MED ORDER — PANTOPRAZOLE SODIUM 20 MG PO TBEC: 20.0000 mg | DELAYED_RELEASE_TABLET | Freq: Every day | ORAL | 0 refills | Status: DC

## 2024-02-13 MED ORDER — ALUM & MAG HYDROXIDE-SIMETH 200-200-20 MG/5ML PO SUSP
30.0000 mL | Freq: Once | ORAL | Status: AC
Start: 2024-02-13 — End: 2024-02-13
  Administered 2024-02-13: 30 mL via ORAL
  Filled 2024-02-13: qty 30

## 2024-02-13 MED ORDER — ONDANSETRON 4 MG PO TBDP: 4.0000 mg | ORAL_TABLET | Freq: Three times a day (TID) | ORAL | 0 refills | Status: DC | PRN

## 2024-02-13 MED ORDER — SODIUM CHLORIDE 0.9 % IV BOLUS
1000.0000 mL | Freq: Once | INTRAVENOUS | Status: AC
  Administered 2024-02-13: 1000 mL via INTRAVENOUS

## 2024-02-13 NOTE — ED Provider Notes (Signed)
 [place] EMERGENCY DEPARTMENT AT [facility] Provider Note   CSN: [id number] Arrival date & time: 02/13/24  9156     Patient presents with: Emesis   Denise Walters is a 43 y.o. female.   Patient is a 43 year old female who presents to the Emergency Department with a chief complaint of generalized malaise and fatigue, burning substernal chest pain, sore throat, nasal congestion and vomiting.  She notes that symptoms initially began yesterday.  Patient notes that she did take Pepto-Bismol with only minimal improvement in her symptoms.  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  She denies any associated dysuria or hematuria.  She has had no diarrhea or constipation.  She denies any fever or chills.   Emesis Associated symptoms: sore throat        Prior to Admission medications   Medication Sig Start Date End Date Taking? Authorizing Provider  acetaminophen  (TYLENOL ) 325 MG tablet Take 2 tablets (650 mg total) by mouth every 6 (six) hours as needed. 11/19/23   Elnor Jayson LABOR, DO  amLODipine  (NORVASC ) 10 MG tablet Take 10 mg by mouth daily.    [provider]  azithromycin  (ZITHROMAX ) 250 MG tablet Take 1 tablet (250 mg total) by mouth daily. Take first 2 tablets together, then 1 every day until finished. 11/19/23   Elnor Jayson LABOR, DO  carvedilol  (COREG ) 25 MG tablet Take 25 mg by mouth 2 (two) times daily. 07/25/17   [provider]  Cholecalciferol (VITAMIN D3) 1.25 MG (50000 UT) CAPS Take 50,000 Units by mouth every Monday. 07/16/23   [provider]  ferrous sulfate 325 (65 FE) MG tablet Take 325 mg by mouth daily.    [provider]  ibuprofen  (ADVIL ) 600 MG tablet Take 1 tablet (600 mg total) by mouth every 6 (six) hours as needed. 11/19/23   Elnor Jayson LABOR, DO  lidocaine  (XYLOCAINE ) 2 % solution Use as directed 15 mLs in the mouth or throat as needed for mouth pain. 11/19/23   Elnor Jayson LABOR, DO  lisinopril   (PRINIVIL ,ZESTRIL ) 20 MG tablet Take 1 tablet by mouth daily. 07/25/17   [provider]  lisinopril -hydrochlorothiazide  (PRINZIDE ,ZESTORETIC ) 20-25 MG tablet Take 1 tablet by mouth daily. Add to the lisinopril  20 mg    [provider]  oxyCODONE  (ROXICODONE ) 5 MG immediate release tablet Take 1 tablet (5 mg total) by mouth every 4 (four) hours as needed. 10/12/23 10/11/24  Mavis Anes, MD  predniSONE  (STERAPRED UNI-PAK 21 TAB) 10 MG (21) TBPK tablet Take by mouth daily. Take 6 tabs by mouth daily  for 2 days, then 5 tabs for 2 days, then 4 tabs for 2 days, then 3 tabs for 2 days, 2 tabs for 2 days, then 1 tab by mouth daily for 2 days 11/19/23   Elnor Jayson LABOR, DO    Allergies: Chocolate    Review of Systems  HENT:  Positive for rhinorrhea and sore throat.   Cardiovascular:  Positive for chest pain.  Gastrointestinal:  Positive for vomiting.  All other systems reviewed and are negative.   Updated Vital Signs BP (!) 159/117 (BP Location: Right Arm)   Pulse (!) 109   Temp 99.3 F (37.4 C) (Oral)   Resp 16   Ht 5' 2 (1.575 m)   Wt 124.7 kg   LMP 02/08/2024 (Exact Date)   SpO2 100%   BMI 50.28 kg/m   Physical Exam Vitals and nursing note reviewed.  Constitutional:  General: She is not in acute distress.    Appearance: Normal appearance. She is not ill-appearing.  HENT:     Head: Normocephalic and atraumatic.     Nose: Nose normal.     Mouth/Throat:     Mouth: Mucous membranes are moist.  Eyes:     Extraocular Movements: Extraocular movements intact.     Conjunctiva/sclera: Conjunctivae normal.     Pupils: Pupils are equal, round, and reactive to light.  Cardiovascular:     Rate and Rhythm: Normal rate and regular rhythm.     Pulses: Normal pulses.     Heart sounds: Normal heart sounds. No murmur heard.    No gallop.  Pulmonary:     Effort: Pulmonary effort is normal. No respiratory distress.     Breath sounds: Normal breath sounds. No stridor. No  wheezing, rhonchi or rales.  Abdominal:     General: Abdomen is flat. Bowel sounds are normal. There is no distension.     Palpations: Abdomen is soft.     Tenderness: There is no abdominal tenderness. There is no guarding.  Musculoskeletal:        General: Normal range of motion.     Cervical back: Normal range of motion and neck supple. No rigidity or tenderness.  Skin:    General: Skin is warm and dry.  Neurological:     General: No focal deficit present.     Mental Status: She is alert and oriented to person, place, and time. Mental status is at baseline.  Psychiatric:        Mood and Affect: Mood normal.        Behavior: Behavior normal.        Thought Content: Thought content normal.        Judgment: Judgment normal.     (all labs ordered are listed, but only abnormal results are displayed) Labs Reviewed  RESP PANEL BY RT-PCR (RSV, FLU A&B, COVID)  [id number]  GROUP A STREP BY PCR  URINALYSIS, ROUTINE W REFLEX MICROSCOPIC  CBC WITH DIFFERENTIAL/PLATELET  COMPREHENSIVE METABOLIC PANEL WITH GFR  LIPASE, BLOOD  TROPONIN T, HIGH SENSITIVITY    EKG: None  Radiology: No results found.   Procedures   Medications Ordered in the ED  sodium chloride  0.9 % bolus 1,000 mL (has no administration in time range)  ondansetron  (ZOFRAN ) injection 4 mg (has no administration in time range)  alum & mag hydroxide-simeth (MAALOX/MYLANTA) 200-200-20 MG/5ML suspension 30 mL (has no administration in time range)    And  lidocaine  (XYLOCAINE ) 2 % viscous mouth solution 15 mL (has no administration in time range)  ondansetron  (ZOFRAN -ODT) disintegrating tablet 4 mg (4 mg Oral Given 02/13/24 0929)                                    Medical Decision Making Amount and/or Complexity of Data Reviewed Labs: ordered. Radiology: ordered.  Risk OTC drugs. Prescription drug management.   This patient presents to the ED for concern of nausea, vomiting, sore throat, rhinorrhea, burning in  chest differential diagnosis includes acute viral syndrome, COVID-19, influenza, RSV, strep, ACS, pulmonary embolus, pericarditis, myocarditis, pneumonia, pneumothorax, hemothorax    Additional history obtained:  Additional history obtained from none External records from outside source obtained and reviewed including none   Lab Tests:  I Ordered, and personally interpreted labs.  The pertinent results include: Leukocytosis, anemia, platelet count at baseline, normal  electrolytes, normal kidney function liver function, negative lipase, unremarkable urinalysis, negative strep test, negative respiratory panel, negative troponin   Imaging Studies ordered:  I ordered imaging studies including chest x-ray I independently visualized and interpreted imaging which showed no acute cardiopulmonary process I agree with the radiologist interpretation   Medicines ordered and prescription drug management:  I ordered medication including IV fluids, GI cocktail, Zofran  for nausea, vomiting, substernal chest burning Reevaluation of the patient after these medicines showed that the patient improved I have reviewed the patients home medicines and have made adjustments as needed   Problem List / ED Course:  Patient is doing very well at this time and is stable for discharge home.  Tachycardia has resolved and heart rate is in the 90s on my last evaluation.  Patient notes that she is feeling better at this point.  Suspect an acute viral syndrome.  Low suspicion for ACS, pulmonary embolus, pericarditis, myocarditis.  Abdominal exam is benign with no focal tenderness throughout and do not suspect acute appendicitis, cholecystitis, small bowel obstruction, diverticulitis, ovarian torsion or cyst, PID, tubo-ovarian abscess, pyelonephritis, kidney stone, pancreatitis, mesenteric ischemia.  Do not suspect that any further emergent workup or imaging is warranted at this time.  Close follow-up with PCP was  discussed.  Will continue symptomatic treatment on outpatient basis.  Patient voiced understanding and had no additional questions.   Social Determinants of Health:  None        Final diagnoses:  None    ED Discharge Orders     None          Daralene Lonni JONETTA DEVONNA 02/13/24 1143    Patsey Lot, MD 02/13/24 1517

## 2024-02-13 NOTE — Discharge Instructions (Signed)
 Please follow-up closely with your primary care doctor on an outpatient basis.  Return to emergency department immediately for any new or worsening symptoms.

## 2024-02-13 NOTE — ED Triage Notes (Signed)
 Pt arrived via POV c/o muscle pain, emesis since yesterday and reports now she has burning sternal chest pain. Pt reports trying Pepto Bismol with minimal relief. 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

## 2024-03-25 ENCOUNTER — Encounter: Payer: Self-pay | Admitting: Obstetrics & Gynecology

## 2024-03-25 ENCOUNTER — Ambulatory Visit: Admitting: Obstetrics & Gynecology

## 2024-03-25 VITALS — BP 150/93 | HR 86 | Ht 62.0 in | Wt 269.4 lb

## 2024-03-25 DIAGNOSIS — I10 Essential (primary) hypertension: Secondary | ICD-10-CM | POA: Diagnosis not present

## 2024-03-25 DIAGNOSIS — Z6841 Body Mass Index (BMI) 40.0 and over, adult: Secondary | ICD-10-CM

## 2024-03-25 DIAGNOSIS — N920 Excessive and frequent menstruation with regular cycle: Secondary | ICD-10-CM

## 2024-03-25 MED ORDER — TRANEXAMIC ACID 650 MG PO TABS: ORAL_TABLET | ORAL | 6 refills | Status: AC

## 2024-03-25 NOTE — Progress Notes (Signed)
 GYN VISIT Patient name: Denise Walters MRN [id number]  Date of birth: 1980/08/02 Chief Complaint:   No chief complaint on file.  History of Present Illness:   Denise Walters is a 43 y.o. G70P1001 female being seen today for the following concerns:  -Menses are regular each month lasting for about 7 days- typically 4 heavy days using pads +depends changing every 94min-1hr. +dysmenorrhea- taking iburpofen or tylenol  with minimal improvement.  Mostly pain is noted while lying down.  A few mos ago, she was started on a 'birth control pill not sure which one, but it didn't help.  Took pill pack for about a month.  Bleeding has led to iron def. Anemia- Hgb 9.4  Patient's last menstrual period was 03/03/2024.    Review of Systems:   Pertinent items are noted in HPI Denies fever/chills, dizziness, headaches, visual disturbances, fatigue, shortness of breath, chest pain, abdominal pain, vomiting Pertinent History Reviewed:   Past Surgical History:  Procedure Laterality Date   BREAST REDUCTION SURGERY     CESAREAN SECTION  10/05/2003   CHOLECYSTECTOMY  12/01/2003   COLONOSCOPY N/A 06/28/2016   Dr. Harvey: diverticulosis, hemorrhoids. next tcs in 06/2021 due to [facility].   HEMORRHOID SURGERY N/A 10/12/2023   Procedure: HEMORRHOIDECTOMY;  Surgeon: Mavis Anes, MD;  Location: [facility];  Service: General;  Laterality: N/A;   TONSILLECTOMY     TONSILLECTOMY AND ADENOIDECTOMY Bilateral 05/05/2014   Procedure: BILATERAL TONSILLECTOMY  W/  ABLATATION OF ADENOIDS;  Surgeon: Ana LELON Moccasin, MD;  Location: [facility];  Service: ENT;  Laterality: Bilateral;   TUBAL LIGATION  01/08/2004    Past Medical History:  Diagnosis Date   Abscess    Chronic headache    Elevated lipids    History of seizure 2010   Hypertension    Seizures (HCC)    Tonsillar and adenoid hypertrophy    Reviewed problem list, medications and allergies. Physical Assessment:   Vitals:   03/25/24 1528 03/25/24 1558   BP: (!) 142/96 (!) 150/93  Pulse: 92 86  Weight: 269 lb 6.4 oz (122.2 kg)   Height: 5' 2 (1.575 m)   Body mass index is 49.27 kg/m.       Physical Examination:   General appearance: alert, well appearing, and in no distress  Psych: mood appropriate, normal affect  Skin: warm & dry   Cardiovascular: normal heart rate noted  Respiratory: normal respiratory effort, no distress  Abdomen: obese, soft, non-tender- unable to assses uterine size  Pelvic: VULVA: normal appearing vulva with no masses, tenderness or lesions, VAGINA: normal appearing vagina with normal color and discharge, no lesions, CERVIX: normal appearing cervix without discharge or lesions, UTERUS: uterus is normal size, shape, consistency and nontender  Extremities: no edema   Chaperone: Alan Fischer    Assessment & Plan:  1) HMB -due to cHTN discussed progesterone only options -reviewed POPs, TXA, Depot or LARCs -briefly discussed surgical intervention though options such as endometrial ablation, uterine artery embolization or possibly hysterectomy.  Briefly discussed risk benefits of these options.  Reviewed recovery 8 to 12 weeks from hysterectomy - Due to work restrictions.  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 concerned about time away - Will plan for pelvic ultrasound at next available - For now we will trial TXAs  []  May also consider EMB if no improvement with medical management  -obesity -Chronic HTN Pt has PCP  []  Plan to obtain records regarding Pap  Meds ordered this  encounter  Medications   tranexamic acid (LYSTEDA) 650 MG TABS tablet    Sig: Take up as much as 2 tablets 3 times daily during your menses for a max of 5 days/month    Dispense:  30 tablet    Refill:  6      Orders Placed This Encounter  Procedures   US  PELVIC COMPLETE WITH TRANSVAGINAL    Return for request records including pap, pelvic ultrasound next available followed by visit with me.   Delon Prude,  DO Attending Obstetrician & Gynecologist, [facility] for [facility], [facility]

## 2024-04-23 ENCOUNTER — Other Ambulatory Visit: Admitting: Radiology

## 2024-04-23 ENCOUNTER — Ambulatory Visit: Admitting: Obstetrics & Gynecology
